# Patient Record
Sex: Female | Born: 1945 | Race: White | Hispanic: No | Smoking: Former smoker
Health system: Southern US, Community
[De-identification: ages and names within clinical notes are randomized; demographics above are authoritative.]

## PROBLEM LIST (undated history)

## (undated) DIAGNOSIS — R0602 Shortness of breath: Secondary | ICD-10-CM

## (undated) DIAGNOSIS — M23206 Derangement of unspecified meniscus due to old tear or injury, right knee: Secondary | ICD-10-CM

## (undated) DIAGNOSIS — K7689 Other specified diseases of liver: Secondary | ICD-10-CM

## (undated) DIAGNOSIS — G8929 Other chronic pain: Secondary | ICD-10-CM

## (undated) DIAGNOSIS — J40 Bronchitis, not specified as acute or chronic: Secondary | ICD-10-CM

## (undated) DIAGNOSIS — M23200 Derangement of unspecified lateral meniscus due to old tear or injury, right knee: Secondary | ICD-10-CM

## (undated) DIAGNOSIS — Z8719 Personal history of other diseases of the digestive system: Secondary | ICD-10-CM

## (undated) DIAGNOSIS — G4700 Insomnia, unspecified: Secondary | ICD-10-CM

## (undated) DIAGNOSIS — K635 Polyp of colon: Secondary | ICD-10-CM

## (undated) DIAGNOSIS — I10 Essential (primary) hypertension: Secondary | ICD-10-CM

## (undated) DIAGNOSIS — D126 Benign neoplasm of colon, unspecified: Secondary | ICD-10-CM

## (undated) DIAGNOSIS — K5730 Diverticulosis of large intestine without perforation or abscess without bleeding: Secondary | ICD-10-CM

## (undated) DIAGNOSIS — E13319 Other specified diabetes mellitus with unspecified diabetic retinopathy without macular edema: Secondary | ICD-10-CM

## (undated) DIAGNOSIS — R1312 Dysphagia, oropharyngeal phase: Secondary | ICD-10-CM

## (undated) DIAGNOSIS — M79602 Pain in left arm: Secondary | ICD-10-CM

## (undated) DIAGNOSIS — N3941 Urge incontinence: Secondary | ICD-10-CM

## (undated) DIAGNOSIS — M23203 Derangement of unspecified medial meniscus due to old tear or injury, right knee: Secondary | ICD-10-CM

## (undated) DIAGNOSIS — IMO0001 Reserved for inherently not codable concepts without codable children: Secondary | ICD-10-CM

## (undated) DIAGNOSIS — Z8739 Personal history of other diseases of the musculoskeletal system and connective tissue: Secondary | ICD-10-CM

## (undated) DIAGNOSIS — N189 Chronic kidney disease, unspecified: Secondary | ICD-10-CM

## (undated) DIAGNOSIS — M1990 Unspecified osteoarthritis, unspecified site: Secondary | ICD-10-CM

## (undated) DIAGNOSIS — Z5189 Encounter for other specified aftercare: Secondary | ICD-10-CM

## (undated) DIAGNOSIS — D3500 Benign neoplasm of unspecified adrenal gland: Secondary | ICD-10-CM

## (undated) DIAGNOSIS — M542 Cervicalgia: Secondary | ICD-10-CM

## (undated) DIAGNOSIS — Z87440 Personal history of urinary (tract) infections: Secondary | ICD-10-CM

## (undated) DIAGNOSIS — C029 Malignant neoplasm of tongue, unspecified: Secondary | ICD-10-CM

## (undated) DIAGNOSIS — K219 Gastro-esophageal reflux disease without esophagitis: Secondary | ICD-10-CM

## (undated) DIAGNOSIS — R29898 Other symptoms and signs involving the musculoskeletal system: Secondary | ICD-10-CM

## (undated) DIAGNOSIS — E785 Hyperlipidemia, unspecified: Secondary | ICD-10-CM

## (undated) DIAGNOSIS — IMO0002 Reserved for concepts with insufficient information to code with codable children: Secondary | ICD-10-CM

## (undated) HISTORY — DX: Other specified diseases of liver: K76.89

## (undated) HISTORY — PX: CATARACT EXTRACTION W/ INTRAOCULAR LENS  IMPLANT, BILATERAL: SHX1307

## (undated) HISTORY — DX: Benign neoplasm of colon, unspecified: D12.6

## (undated) HISTORY — DX: Benign neoplasm of unspecified adrenal gland: D35.00

## (undated) HISTORY — DX: Diverticulosis of large intestine without perforation or abscess without bleeding: K57.30

## (undated) HISTORY — DX: Shortness of breath: R06.02

## (undated) HISTORY — PX: OTHER SURGICAL HISTORY: SHX169

## (undated) HISTORY — DX: Essential (primary) hypertension: I10

## (undated) HISTORY — DX: Personal history of other diseases of the digestive system: Z87.19

## (undated) HISTORY — DX: Reserved for concepts with insufficient information to code with codable children: IMO0002

## (undated) HISTORY — PX: CHOLECYSTECTOMY: SHX55

## (undated) HISTORY — DX: Personal history of other diseases of the musculoskeletal system and connective tissue: Z87.39

## (undated) HISTORY — DX: Gastro-esophageal reflux disease without esophagitis: K21.9

---

## 1982-10-21 HISTORY — PX: ABDOMINAL HYSTERECTOMY: SHX81

## 1988-10-21 HISTORY — PX: SHOULDER SURGERY: SHX246

## 1999-08-09 ENCOUNTER — Ambulatory Visit (HOSPITAL_COMMUNITY): Admission: RE | Admit: 1999-08-09 | Discharge: 1999-08-09 | Payer: Self-pay | Admitting: Family Medicine

## 1999-08-09 ENCOUNTER — Encounter: Payer: Self-pay | Admitting: Family Medicine

## 1999-10-22 HISTORY — PX: LAPAROSCOPIC SALPINGOOPHERECTOMY: SUR795

## 1999-10-31 ENCOUNTER — Encounter: Payer: Self-pay | Admitting: Family Medicine

## 1999-10-31 ENCOUNTER — Ambulatory Visit (HOSPITAL_COMMUNITY): Admission: RE | Admit: 1999-10-31 | Discharge: 1999-10-31 | Payer: Self-pay | Admitting: Family Medicine

## 1999-11-08 ENCOUNTER — Other Ambulatory Visit: Admission: RE | Admit: 1999-11-08 | Discharge: 1999-11-08 | Payer: Self-pay | Admitting: Obstetrics and Gynecology

## 1999-12-19 ENCOUNTER — Encounter (INDEPENDENT_AMBULATORY_CARE_PROVIDER_SITE_OTHER): Payer: Self-pay | Admitting: Specialist

## 1999-12-19 ENCOUNTER — Other Ambulatory Visit: Admission: RE | Admit: 1999-12-19 | Discharge: 1999-12-19 | Payer: Self-pay | Admitting: Gastroenterology

## 2000-01-11 ENCOUNTER — Encounter: Payer: Self-pay | Admitting: Gastroenterology

## 2000-01-11 ENCOUNTER — Ambulatory Visit (HOSPITAL_COMMUNITY): Admission: RE | Admit: 2000-01-11 | Discharge: 2000-01-11 | Payer: Self-pay | Admitting: Gastroenterology

## 2000-01-25 ENCOUNTER — Encounter (INDEPENDENT_AMBULATORY_CARE_PROVIDER_SITE_OTHER): Payer: Self-pay | Admitting: Specialist

## 2000-01-25 ENCOUNTER — Ambulatory Visit (HOSPITAL_COMMUNITY): Admission: RE | Admit: 2000-01-25 | Discharge: 2000-01-25 | Payer: Self-pay | Admitting: Gastroenterology

## 2000-02-11 ENCOUNTER — Ambulatory Visit (HOSPITAL_COMMUNITY): Admission: RE | Admit: 2000-02-11 | Discharge: 2000-02-11 | Payer: Self-pay | Admitting: Family Medicine

## 2000-02-11 ENCOUNTER — Encounter: Payer: Self-pay | Admitting: Family Medicine

## 2000-05-08 ENCOUNTER — Ambulatory Visit (HOSPITAL_COMMUNITY): Admission: RE | Admit: 2000-05-08 | Discharge: 2000-05-08 | Payer: Self-pay | Admitting: Neurosurgery

## 2000-05-08 ENCOUNTER — Encounter: Payer: Self-pay | Admitting: Neurosurgery

## 2000-06-02 ENCOUNTER — Encounter: Payer: Self-pay | Admitting: Obstetrics and Gynecology

## 2000-06-03 ENCOUNTER — Inpatient Hospital Stay (HOSPITAL_COMMUNITY): Admission: EM | Admit: 2000-06-03 | Discharge: 2000-06-05 | Payer: Self-pay | Admitting: Obstetrics and Gynecology

## 2000-06-03 ENCOUNTER — Encounter (INDEPENDENT_AMBULATORY_CARE_PROVIDER_SITE_OTHER): Payer: Self-pay

## 2000-07-02 ENCOUNTER — Encounter: Admission: RE | Admit: 2000-07-02 | Discharge: 2000-07-09 | Payer: Self-pay | Admitting: Orthopaedic Surgery

## 2000-08-05 ENCOUNTER — Encounter: Admission: RE | Admit: 2000-08-05 | Discharge: 2000-11-03 | Payer: Self-pay | Admitting: Orthopaedic Surgery

## 2000-12-19 ENCOUNTER — Encounter: Payer: Self-pay | Admitting: Sports Medicine

## 2000-12-19 ENCOUNTER — Ambulatory Visit (HOSPITAL_COMMUNITY): Admission: RE | Admit: 2000-12-19 | Discharge: 2000-12-19 | Payer: Self-pay | Admitting: Sports Medicine

## 2001-04-21 ENCOUNTER — Ambulatory Visit (HOSPITAL_COMMUNITY): Admission: RE | Admit: 2001-04-21 | Discharge: 2001-04-21 | Payer: Self-pay | Admitting: Family Medicine

## 2001-04-21 ENCOUNTER — Encounter: Payer: Self-pay | Admitting: Family Medicine

## 2001-05-09 ENCOUNTER — Encounter: Admission: RE | Admit: 2001-05-09 | Discharge: 2001-05-09 | Payer: Self-pay | Admitting: Family Medicine

## 2001-05-09 ENCOUNTER — Encounter: Payer: Self-pay | Admitting: Family Medicine

## 2001-08-06 ENCOUNTER — Encounter: Payer: Self-pay | Admitting: Family Medicine

## 2001-08-06 ENCOUNTER — Ambulatory Visit (HOSPITAL_COMMUNITY): Admission: RE | Admit: 2001-08-06 | Discharge: 2001-08-06 | Payer: Self-pay | Admitting: Family Medicine

## 2001-11-17 ENCOUNTER — Encounter: Payer: Self-pay | Admitting: Otolaryngology

## 2001-11-17 ENCOUNTER — Encounter: Admission: RE | Admit: 2001-11-17 | Discharge: 2001-11-17 | Payer: Self-pay | Admitting: Otolaryngology

## 2003-02-07 ENCOUNTER — Encounter: Payer: Self-pay | Admitting: Gastroenterology

## 2003-02-07 ENCOUNTER — Encounter (INDEPENDENT_AMBULATORY_CARE_PROVIDER_SITE_OTHER): Payer: Self-pay | Admitting: Specialist

## 2003-02-07 ENCOUNTER — Ambulatory Visit (HOSPITAL_COMMUNITY): Admission: RE | Admit: 2003-02-07 | Discharge: 2003-02-07 | Payer: Self-pay | Admitting: Gastroenterology

## 2003-02-07 DIAGNOSIS — D126 Benign neoplasm of colon, unspecified: Secondary | ICD-10-CM | POA: Insufficient documentation

## 2004-07-12 ENCOUNTER — Ambulatory Visit (HOSPITAL_COMMUNITY): Admission: RE | Admit: 2004-07-12 | Discharge: 2004-07-12 | Payer: Self-pay | Admitting: Gastroenterology

## 2004-09-06 DIAGNOSIS — K5730 Diverticulosis of large intestine without perforation or abscess without bleeding: Secondary | ICD-10-CM | POA: Insufficient documentation

## 2005-04-03 ENCOUNTER — Ambulatory Visit: Payer: Self-pay | Admitting: Gastroenterology

## 2005-10-21 HISTORY — PX: NECK MASS EXCISION: SHX2079

## 2005-10-21 HISTORY — PX: RADICAL NECK DISSECTION: SHX2284

## 2005-10-21 HISTORY — PX: TONSILLECTOMY: SUR1361

## 2005-10-21 HISTORY — PX: DIRECT LARYNGOSCOPY: SHX5326

## 2006-01-27 ENCOUNTER — Encounter (INDEPENDENT_AMBULATORY_CARE_PROVIDER_SITE_OTHER): Payer: Self-pay | Admitting: *Deleted

## 2006-01-27 ENCOUNTER — Ambulatory Visit (HOSPITAL_COMMUNITY): Admission: RE | Admit: 2006-01-27 | Discharge: 2006-01-28 | Payer: Self-pay | Admitting: Otolaryngology

## 2006-02-11 ENCOUNTER — Encounter: Admission: RE | Admit: 2006-02-11 | Discharge: 2006-02-11 | Payer: Self-pay | Admitting: Otolaryngology

## 2006-02-17 ENCOUNTER — Ambulatory Visit: Admission: RE | Admit: 2006-02-17 | Discharge: 2006-04-24 | Payer: Self-pay | Admitting: Radiation Oncology

## 2006-02-28 ENCOUNTER — Ambulatory Visit (HOSPITAL_COMMUNITY): Admission: RE | Admit: 2006-02-28 | Discharge: 2006-02-28 | Payer: Self-pay | Admitting: Radiation Oncology

## 2006-03-19 ENCOUNTER — Encounter (INDEPENDENT_AMBULATORY_CARE_PROVIDER_SITE_OTHER): Payer: Self-pay | Admitting: *Deleted

## 2006-03-19 ENCOUNTER — Inpatient Hospital Stay (HOSPITAL_COMMUNITY): Admission: RE | Admit: 2006-03-19 | Discharge: 2006-03-23 | Payer: Self-pay | Admitting: Otolaryngology

## 2006-06-04 ENCOUNTER — Encounter: Admission: RE | Admit: 2006-06-04 | Discharge: 2006-07-30 | Payer: Self-pay | Admitting: *Deleted

## 2006-07-25 ENCOUNTER — Ambulatory Visit (HOSPITAL_COMMUNITY): Admission: RE | Admit: 2006-07-25 | Discharge: 2006-07-25 | Payer: Self-pay | Admitting: Otolaryngology

## 2007-02-20 ENCOUNTER — Ambulatory Visit (HOSPITAL_COMMUNITY): Admission: RE | Admit: 2007-02-20 | Discharge: 2007-02-20 | Payer: Self-pay | Admitting: Otolaryngology

## 2007-03-13 ENCOUNTER — Ambulatory Visit: Payer: Self-pay | Admitting: Gastroenterology

## 2007-03-31 ENCOUNTER — Ambulatory Visit (HOSPITAL_COMMUNITY): Admission: RE | Admit: 2007-03-31 | Discharge: 2007-03-31 | Payer: Self-pay | Admitting: *Deleted

## 2007-03-31 DIAGNOSIS — D3500 Benign neoplasm of unspecified adrenal gland: Secondary | ICD-10-CM | POA: Insufficient documentation

## 2007-05-18 ENCOUNTER — Ambulatory Visit: Payer: Self-pay | Admitting: Cardiology

## 2007-05-27 ENCOUNTER — Ambulatory Visit: Payer: Self-pay

## 2007-07-06 ENCOUNTER — Encounter: Admission: RE | Admit: 2007-07-06 | Discharge: 2007-07-17 | Payer: Self-pay | Admitting: *Deleted

## 2007-07-08 ENCOUNTER — Encounter: Admission: RE | Admit: 2007-07-08 | Discharge: 2007-07-08 | Payer: Self-pay | Admitting: Otolaryngology

## 2008-04-05 DIAGNOSIS — C069 Malignant neoplasm of mouth, unspecified: Secondary | ICD-10-CM | POA: Insufficient documentation

## 2008-04-05 DIAGNOSIS — E119 Type 2 diabetes mellitus without complications: Secondary | ICD-10-CM | POA: Insufficient documentation

## 2008-04-05 DIAGNOSIS — K219 Gastro-esophageal reflux disease without esophagitis: Secondary | ICD-10-CM | POA: Insufficient documentation

## 2008-04-05 DIAGNOSIS — K7689 Other specified diseases of liver: Secondary | ICD-10-CM

## 2008-04-05 DIAGNOSIS — K3184 Gastroparesis: Secondary | ICD-10-CM | POA: Insufficient documentation

## 2008-04-05 DIAGNOSIS — F329 Major depressive disorder, single episode, unspecified: Secondary | ICD-10-CM | POA: Insufficient documentation

## 2008-04-05 DIAGNOSIS — I2510 Atherosclerotic heart disease of native coronary artery without angina pectoris: Secondary | ICD-10-CM | POA: Insufficient documentation

## 2008-04-05 DIAGNOSIS — K7581 Nonalcoholic steatohepatitis (NASH): Secondary | ICD-10-CM | POA: Insufficient documentation

## 2008-04-05 DIAGNOSIS — IMO0002 Reserved for concepts with insufficient information to code with codable children: Secondary | ICD-10-CM | POA: Insufficient documentation

## 2008-04-19 ENCOUNTER — Ambulatory Visit (HOSPITAL_COMMUNITY): Admission: RE | Admit: 2008-04-19 | Discharge: 2008-04-19 | Payer: Self-pay | Admitting: Otolaryngology

## 2008-04-27 ENCOUNTER — Encounter: Payer: Self-pay | Admitting: Gastroenterology

## 2008-04-28 ENCOUNTER — Ambulatory Visit: Payer: Self-pay | Admitting: Gastroenterology

## 2008-11-17 ENCOUNTER — Ambulatory Visit (HOSPITAL_COMMUNITY): Admission: RE | Admit: 2008-11-17 | Discharge: 2008-11-17 | Payer: Self-pay | Admitting: Otolaryngology

## 2009-01-05 ENCOUNTER — Ambulatory Visit: Admission: RE | Admit: 2009-01-05 | Discharge: 2009-02-03 | Payer: Self-pay | Admitting: Radiation Oncology

## 2009-01-31 ENCOUNTER — Encounter (INDEPENDENT_AMBULATORY_CARE_PROVIDER_SITE_OTHER): Payer: Self-pay | Admitting: Otolaryngology

## 2009-01-31 ENCOUNTER — Ambulatory Visit (HOSPITAL_BASED_OUTPATIENT_CLINIC_OR_DEPARTMENT_OTHER): Admission: RE | Admit: 2009-01-31 | Discharge: 2009-01-31 | Payer: Self-pay | Admitting: Otolaryngology

## 2009-01-31 HISTORY — PX: TONSILLECTOMY: SUR1361

## 2009-06-29 ENCOUNTER — Encounter: Admission: RE | Admit: 2009-06-29 | Discharge: 2009-06-29 | Payer: Self-pay | Admitting: *Deleted

## 2009-07-03 ENCOUNTER — Ambulatory Visit (HOSPITAL_COMMUNITY): Admission: RE | Admit: 2009-07-03 | Discharge: 2009-07-03 | Payer: Self-pay | Admitting: Otolaryngology

## 2010-02-15 ENCOUNTER — Encounter: Admission: RE | Admit: 2010-02-15 | Discharge: 2010-05-16 | Payer: Self-pay | Admitting: Otolaryngology

## 2010-05-15 ENCOUNTER — Encounter: Admission: RE | Admit: 2010-05-15 | Discharge: 2010-05-15 | Payer: Self-pay | Admitting: Orthopedic Surgery

## 2010-11-10 ENCOUNTER — Encounter: Payer: Self-pay | Admitting: *Deleted

## 2010-11-11 ENCOUNTER — Encounter: Payer: Self-pay | Admitting: Otolaryngology

## 2010-11-22 NOTE — Procedures (Signed)
Summary: Colon   Colonoscopy  Procedure date:  02/07/2003  Findings:      Location:  [facility].    Procedures Next Due Date:    Colonoscopy: 02/2008  Patient Name: Paula Steele, Paula Steele MRN: [id number] Procedure Procedures: Colonoscopy CPT: 95284.    with Hot Biopsy(s)CPT: Z451292.  Personnel: Endoscopist: Barbette Hair. Arlyce Dice, MD.  Indications  Surveillance of: Adenomatous Polyp(s). 2001.  History  Pre-Exam Physical: Performed Feb 07, 2003. Entire physical exam was normal.  Exam Exam: Extent of exam reached: Cecum, extent intended: Cecum.  The cecum was identified by IC valve. Colon retroflexion performed. ASA Classification: II. Tolerance: good.  Monitoring: Pulse and BP monitoring, Oximetry used. at 2 Liters.  Colon Prep Used Golytely for colon prep. Prep results: good.  Sedation Meds: Fentanyl  50 mcg. given IV. Versed 5 mg. given IV.  Findings POLYP: Transverse Colon, Maximum size: 3 mm. Procedure:  hot biopsy, Polyp sent to pathology. ICD9: Colon Polyps: 211.3.  POLYP: Sigmoid Colon, Maximum size: 2 mm. Distance from Anus 30 cm. Procedure:  hot biopsy, sent to pathology. ICD9: Colon Polyps: 211.3.  - POLYP: Sigmoid Colon, Maximum size: 2 mm. Distance from Anus 29 cm. Procedure:  hot biopsy, sent to pathology. ICD9: Colon Polyps: 211.3.   Assessment Abnormal examination, see findings above.  Diagnoses: 211.3: Colon Polyps.   Events  Unplanned Interventions: No intervention was required.  Unplanned Events: There were no complications. Plans Patient Education: Patient given standard instructions for: Polyps.  Scheduling/Referral: Colonoscopy, to Barbette Hair. Arlyce Dice, MD, around Feb 07, 2008.    cc: Rudi Heap, MD  This report was created from the original endoscopy report, which was reviewed and signed by the above listed endoscopist.

## 2010-11-22 NOTE — Procedures (Signed)
Summary: EGD   EGD  Procedure date:  02/07/2003  Findings:      Location: [facility]    Patient Name: Tejasvi, Paula Steele MRN: [id number] Procedure Procedures: Panendoscopy (EGD) CPT: 43235.    with biopsy(s)/brushing(s). CPT: D1846139.  Personnel: Endoscopist: Barbette Hair. Arlyce Dice, MD.  Indications Symptoms: Reflux symptoms  History  Pre-Exam Physical: Performed Feb 07, 2003  Entire physical exam was normal.  Exam Exam Info: Maximum depth of insertion Duodenum, intended Duodenum. Vocal cords visualized. Gastric retroflexion performed. ASA Classification: II. Tolerance: good.  Sedation Meds: Residual sedation present from prior procedure today. Robinul 0.2 given IV. Fentanyl 25 mcg. given IV. Versed 2 mg. given IV. Cetacaine Spray 2 sprays given aerosolized.  Monitoring: BP and pulse monitoring done. Oximetry used. Supplemental O2 given at 2 Liters.  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 Proximal margin 39 cm from mouth,  distal margin 40 cm. Length of Barrett's 1 cm. Inflammation present. Biopsy/Barrett's taken. ICD9: Barrett's: 530.2.   Assessment  Diagnoses: 530.2: Barrett's.   Events  Unplanned Intervention: No unplanned interventions were required.  Unplanned Events: There were no complications. Plans Medication(s): Await pathology. Continue current medications.  Scheduling: Office Visit, to [employer]. Arlyce Dice, MD, around Mar 09, 2003.    CC Rudi Heap, MD  This report was created from the original endoscopy report, which was reviewed and signed by the above listed endoscopist.

## 2010-11-22 NOTE — Assessment & Plan Note (Signed)
Summary: CHRONIC ABD PAIN/YF    History of Present Illness Visit Type: consult Primary GI MD: Melvia Heaps MD [facility] Primary [place]art Marte: Cherly Anderson Requesting Robynne Roat: Cherly Anderson Chief Complaint: recall colonoscopy, c/o generalized dull abd pain and nausea after starting new diabetic med (byetta) in June.  History of Present Illness:   [name]. return for evaluation of nausea and history of colon polyps. Last colonoscopy demonstrated adenomatous polyps 5 years ago. Mrs. Peden has no lower GI complaints. She has a history of gastroparesis which did not respond well to Reglan. She is unsure whether erythromycin was helpful.  She does complain of mild, chronic nausea She also has a history of esophagitis. Question of Barrett's was raised in the past but biopsies did not reveal this. Most recently she suffered from mild lower abdominal pain and generalized pain coincident with her starting byetta.    GI Review of Systems    Reports abdominal pain and  nausea.     Location of  Abdominal pain: generalized.          Prior Medications Reviewed Using: Patient Recall  Updated Prior Medication List: AMARYL 4 MG  TABS (GLIMEPIRIDE) 1 tablet twice a day CRESTOR 5 MG  TABS (ROSUVASTATIN CALCIUM) one tablet every day ZYRTEC ALLERGY 10 MG  TABS (CETIRIZINE HCL) one tablet every day LANTUS 100 UNIT/ML  SOLN (INSULIN GLARGINE) 60 units in morning and 60 units at night BYETTA 5 MCG PEN 5 MCG/0.02ML  SOLN (EXENATIDE) 10 units in am and 10 units at night PROTONIX 40 MG  TBEC (PANTOPRAZOLE SODIUM) one tablet by mouth once daily AMBIEN CR 12.5 MG  TBCR (ZOLPIDEM TARTRATE) one tablet by mouth at bedtime VICODIN HP 10-660 MG  TABS (HYDROCODONE-ACETAMINOPHEN) one tablet by mouth as needed  Current Allergies (reviewed today): No known allergies   Past Medical History:    Reviewed history from 04/05/2008 and no changes required:       Current Problems:        DIVERTICULOSIS OF  COLON (ICD-562.10)       BARRETT'S ESOPHAGUS, HX OF (ICD-V12.79)       COLONIC POLYPS (ICD-211.3)       GERD (ICD-530.81)       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 Shoulder       Rt. salpingo-oophorectomy (2001)       Lysis of endometrial adhesions (2001)       Cholecystectomy       Partial tongue resection (CA)/ Lt. tonsil/ Lt radical neck disection (2007)   Family History:    Thyroid cancer: Brother    Family History of Diabetes: Mother, Brothers    Family History of Heart Disease: Father, Brothers  Social History:    Married    Patient has never smoked.     Alcohol Use - no    Illicit Drug Use - no   Risk Factors:  Tobacco use:  never Drug use:  no Alcohol use:  no   Review of Systems       The patient complains of allergy/sinus, cough, headaches-new, and hearing problems.     Vital Signs:  Patient Profile:   65 Years Old Female Height:     69 inches Weight:      259 pounds BMI:     38.39 Pulse rate:   80 / minute Pulse rhythm:   regular BP sitting:   132 / 74  (right arm)  Vitals Entered By: Christie Nottingham CMA (April 28, 2008 10:40 AM)                    Impression & Recommendations:  Problem # 1:  COLONIC POLYPS (ICD-211.3) Patient will be scheduled for followup colonoscopy  Problem # 2:  GASTROPARESIS (ICD-536.3) This is an ongoing problem  Recommendations #1 trial of erythromycin 250 mg b.i.d.  Problem # 3:  DIVERTICULOSIS OF COLON (ICD-562.10)   Patient Instructions: 1)  CC Dr. Charm Barges 2)  CC Dr.Altheimer      Prescriptions: ERYTHROMYCIN BASE 250 MG  TABS (ERYTHROMYCIN BASE) One tab q.i.d.  #28 x 10   Entered and Authorized by:   Louis Meckel MD   Signed by:   Louis Meckel MD on 04/28/2008   Method used:   Print then Give to Patient   RxID:   [id number]  ]  Appended Document: CHRONIC ABD PAIN/YF pt wanted to call back to sch.colon

## 2010-11-22 NOTE — Miscellaneous (Signed)
Summary: med update  Clinical Lists Changes  Medications: Added new medication of AMARYL 4 MG  TABS (GLIMEPIRIDE) 1 tablet twice a day Added new medication of ERYTHROMYCIN BASE 500 MG  TABS (ERYTHROMYCIN BASE) 1 tablet twice a day Added new medication of CRESTOR 5 MG  TABS (ROSUVASTATIN CALCIUM) one tablet every day Added new medication of ZYRTEC ALLERGY 10 MG  TABS (CETIRIZINE HCL) one tablet every day Added new medication of NEURONTIN 100 MG  CAPS (GABAPENTIN)

## 2011-01-25 LAB — GLUCOSE, CAPILLARY: Glucose-Capillary: 179 mg/dL — ABNORMAL HIGH (ref 70–99)

## 2011-01-30 LAB — POCT I-STAT, CHEM 8
BUN: 16 mg/dL (ref 6–23)
Calcium, Ion: 1.27 mmol/L (ref 1.12–1.32)
Chloride: 106 mEq/L (ref 96–112)
Creatinine, Ser: 0.7 mg/dL (ref 0.4–1.2)
Glucose, Bld: 182 mg/dL — ABNORMAL HIGH (ref 70–99)
HCT: 45 % (ref 36.0–46.0)
Hemoglobin: 15.3 g/dL — ABNORMAL HIGH (ref 12.0–15.0)
Potassium: 4.2 mEq/L (ref 3.5–5.1)
Sodium: 144 mEq/L (ref 135–145)
TCO2: 29 mmol/L (ref 0–100)

## 2011-01-30 LAB — GLUCOSE, CAPILLARY: Glucose-Capillary: 180 mg/dL — ABNORMAL HIGH (ref 70–99)

## 2011-02-04 LAB — GLUCOSE, CAPILLARY: Glucose-Capillary: 250 mg/dL — ABNORMAL HIGH (ref 70–99)

## 2011-03-05 NOTE — Op Note (Signed)
[name], [name]               ACCOUNT NO.:  [id number]   MEDICAL RECORD NO.:  [id number]          PATIENT TYPE:  AMB   LOCATION:  [facility]                          FACILITY:  [facility]   PHYSICIAN:  Christopher E. Ezzard Standing, M.D.DATE OF BIRTH:  1946-07-07   DATE OF PROCEDURE:  01/31/2009  DATE OF DISCHARGE:                               OPERATIVE REPORT   PREOPERATIVE DIAGNOSES:  1. Right tonsil mass.  2. Right forehead, eyebrow lesion consistent with skin cancer.  3. Left nasal tag.   POSTOPERATIVE DIAGNOSES:  1. Right tonsil mass.  2. Right forehead, eyebrow lesion consistent with skin cancer.  3. Left nasal tag.   OPERATION PERFORMED:  1. Right tonsillectomy.  2. Elliptical excision of right forehead lesion with intermediate      closure (1 cm).  3. Simple excision of left nasal skin tag.   SURGEON:  Kristine Garbe. Ezzard Standing, MD   ANESTHESIA:  General endotracheal.   COMPLICATIONS:  None.   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  She had subsequent PET scan, which  has shown some persistent activity in the right tonsil region suspicious  for persistent or recurrent cancer in the right tonsil area, although on  clinical exam, this appears relatively benign.  Because of persistent  activity in the right tonsil area noted by the PET scan, she is taken to  the operating room at this time for right tonsillectomy.  At the same  time, she also has a small superficial ulcer above her right eyebrow,  measures approximately 1 cm that is consistent with a possible skin  cancer.  We will plan on excising this at the same time as her right  tonsillectomy.  She also has another skin tag that interferes with  wearing  glasses on the left side of the nose, which is a simple skin  tag.  We will plan excising this at the same time.   DESCRIPTION OF PROCEDURE:  After adequate endotracheal anesthesia, the  right forehead, right eyebrow lesion was prepped with Betadine solution  and draped out with sterile towels and injected with Xylocaine with  epinephrine for hemostasis.  The lesion just above the right eyebrow was  then elliptically excised and sent to Pathology with a mark on the  superior border.  A small defect was closed with 4-0 Vicryl sutures  subcutaneously and 6-0 nylon reapproximating skin edges.  Bacitracin  ointment and Band-Aid were applied to the right eyebrow lesion.  She  also had a small skin tag on the left side of her nose that was excised  simply with scissors and the base was cauterized for hemostasis.  Bacitracin ointment and a spot Band-Aid was applied to the left side of  the nose.  This completed the excision of the 2 skin lesions.  A  mouth  gag was then used to expose the oropharynx.  The right tonsil was  excised from the tonsillar fossa using a cautery and sent to Pathology.  Remaining oropharynx was fairly clear.  She had some scar tissue on the  left side of her soft palate where she had the wide excision of the left  tonsil a couple of years ago and a scar band was released with cautery  for about 1 cm length on the left side of the soft palate.  This  completed the procedure.  Luana was awoken from anesthesia and  transferred to recovery room, postop doing well.  Tashiba was discharged  home later this morning on amoxicillin suspension 500 mg b.i.d. for 1  week, Tylenol, Lortab elixir 1-1/2 tablespoons q.4 h. p.r.n. pain and  will continue with the regular medications.  We will have her follow up  my office in 1 week for recheck and having the right eyebrow sutures  removed.           ______________________________  Kristine Garbe Ezzard Standing, M.D.     CEN/MEDQ  D:   01/31/2009  T:  02/01/2009  Job:  [id number]   cc:   Billie Lade, M.D.  Samuel Jester

## 2011-03-05 NOTE — Assessment & Plan Note (Signed)
[facility]                            CARDIOLOGY OFFICE NOTE   HUMNA, MOOREHOUSE                      MRN:          [id number]  DATE:05/18/2007                            DOB:          1946/08/03    I was asked by Dr. Samuel Jester to consult on Paula Steele, a 65-year-  old married white female with chest pain.   HISTORY OF PRESENT ILLNESS:  She is 65 years of age.  She is a type 2  poorly-controlled diabetic, has a sedentary lifestyle, and is  significantly overweight.  She has a history of remote smoking, but quit  16 years ago.  She has mixed hyperlipidemia, on Crestor.   She has been having some sharp chest discomfort that comes and goes sort  of unpredictably.  She does have some dyspnea on exertion.   PAST MEDICAL HISTORY:  Significant for no dye allergies.  Past medical  history is also significant for a hysterectomy in 1984, left shoulder  surgery and she had oral cancer a few years ago and neck surgery.   CURRENT MEDICATIONS:  1. Insulin NovoLog.  2. Lantus insulin nightly.  3. Amaryl 4 mg q.a.m. and nightly.  4. Erythromycin 500 mg p.o. b.i.d.  5. Crestor 5 mg a day.  6. Zyrtec unknown dose daily.  7. Protonix 40 mg a day.   She had a chest CT recently for a right lower lobe nodule.  Apparently,  she was then referred for a PET scan; I do not have a copy of the PET  scan.  It turns out that she was told that she had some problems with  her heart; I presume they saw coronary calcification.  We are trying to  obtain those reports at present.   FAMILY HISTORY:  Positive for her sister dying of a heart problems at  age 57; she states this is coronary-related.  She has 2 brothers, one 20  and one 67, who also have coronary issues.   SOCIAL HISTORY:  She is a foster parent.  She retired in 1992.  She has  4 children.  She is married.  She lives in [place].   REVIEW OF SYSTEMS:  Her review of systems, other than the HPI, is  positive for fatigue, intermittent allergies, hiatal hernia,  gastroesophageal reflux and depression.   Rest of the review of systems are negative.   PHYSICAL EXAMINATION:  Her blood pressure is 140/81.  Her pulse is 79  and regular.  She is 5 feet 9 inches and weighs 264 pounds.  She has a depressed affect.  She is alert and oriented.  NEUROLOGICAL:  Exam is grossly intact.  HEENT:  Normocephalic, atraumatic.  PERRLA.  Extraocular movements  intact.  Sclerae are clear.  Facial symmetry is normal.  Carotids are full.  She has a scar on the left anterior cervical chain  area.  There is no thyromegaly.  LUNGS:  Clear.  HEART:  Poorly appreciated PMI.  She has a normal S1 and S2 without  murmur, rub or gallop.  ABDOMEN:  Soft with good bowel sounds.  Organomegaly could not be  assessed because of her obesity.  EXTREMITIES:  No cyanosis or clubbing, but does reveal trace pretibial  edema.  Pulses are 2+/4+ and bilaterally symmetrical.  NEUROLOGIC:  Exam  is intact.  SKIN:  Unremarkable.   EKG:  Normal.   ASSESSMENT AND PLAN:  Paula Steele is having noncardiac chest discomfort;  however, she has coronary disease with her history of diabetes,  particularly being poorly controlled.  She also has other risk factors  including family history, sedentary lifestyle and mixed hyperlipidemia.  I put her in the moderate to high-risk Framingham global score.   I have recommended a stress Myoview.  She does not want adenosine, based  on what her sister told her.  We will set her up for a dobutamine,  assuming she cannot exercise to an adequate heart rate and good exercise  level.  I will see her back on a p.r.n. basis if her scan is normal or  low risk.  If there is a suggestion of significant obstructive disease,  we will recommend heart catheterization.   I have pulled her PET scan, which showed atherosclerotic-type changes  including prominent coronary artery calcifications.     Thomas C.  Daleen Squibb, MD, [facility]  Electronically Signed    TCW/MedQ  DD: 05/18/2007  DT: 05/19/2007  Job #: [id number]   cc:   Samuel Jester

## 2011-03-05 NOTE — Assessment & Plan Note (Signed)
[facility]                         GASTROENTEROLOGY OFFICE NOTE   Paula Steele, Paula Steele                      MRN:          [id number]  DATE:03/13/2007                            DOB:          [birth year]    PROBLEM LIST:  1. Nausea.  2. Rectal bleeding.   Paula Steele has returned for reevaluation.  Since her last visit, she  underwent a partial tongue resection with lymph node dissection for a  squamous carcinoma.  She has been taking pain medicines intermittently.  Over the last month, she is complaining of frequent nausea.  She has a  history of gastroparesis that was poorly responsive to Reglan.  She did  get some improvement with erythromycin.  She has also had a few episodes  of limited bright red blood per rectum.  She has a history of  adenomatous colon polyps.   MEDICATIONS:  1. Insulin.  2. Amaryl.  3. Protonix.  4. Crestor.  5. Hyoscyamine.   PHYSICAL EXAMINATION:  Pulse 88, blood pressure 126/72, weight 255.  HEENT: EOMI. PERRLA. Sclerae are anicteric.  Conjunctivae are pink.  NECK:  Supple without thyromegaly, adenopathy or carotid bruits.  CHEST:  Clear to auscultation and percussion without adventitious  sounds.  CARDIAC:  Regular rhythm; normal S1 S2.  There are no murmurs, gallops  or rubs.  ABDOMEN:  Bowel sounds are normoactive.  Abdomen is soft, non-tender and  non-distended.  There are no abdominal masses, tenderness, splenic  enlargement or hepatomegaly.  EXTREMITIES:  Full range of motion.  No cyanosis, clubbing or edema.  RECTAL:  Deferred.   IMPRESSION:  1. Nausea.  This could be due to her gastroparesis.  Her narcotic use      with Lorcet may also be contributing.  2. Limited rectal bleeding.  Most likely secondary to hemorrhoids.  A      more proximal bleeding source ought to be ruled out.  3. History of colon polyps.   RECOMMENDATION:  1. Trial of erythromycin 500 mg twice a day.  If she is not improved,    give her Phenergan and      advise her holding on the Lorcet.  2. Colonoscopy.     Barbette Hair. Arlyce Dice, MD,FACG  Electronically Signed    RDK/MedQ  DD: 03/13/2007  DT: 03/13/2007  Job #: [id number]   cc:   Ernestina Penna, M.D.

## 2011-03-08 NOTE — Procedures (Signed)
[facility]  Patient:    Paula Steele, Paula Steele                         MRN: [id number] Proc. Date: 01/25/00 Attending:  Barbette Hair. Arlyce Dice, M.D. [facility]                           Procedure Report  INDICATIONS:  Mrs. Romelle Starcher has abnormal liver tests that was performed for further evaluation.  INFORMED CONSENT:  The patient provided consent after risks, benefits and alternatives were explained.  MEDICATIONS:  Versed 3 mg and fentanyl 25 mcg IV.  DESCRIPTION OF PROCEDURE:  The patient was placed in the supine position.  The liver was percussed.  The skin was prepped with Betadine and injected with 1% lidocaine.  A percutaneous biopsy was performed utilizing a 14-gauge tissue biopsy needle.  A 1.5 cm liver core was obtained.  The patient tolerated the procedure well. DD:  05/29/00 TD:  05/30/00 Job: [id number]

## 2011-03-08 NOTE — H&P (Signed)
[facility]  Patient:    Paula Steele, Paula Steele                        MRN: [id number] Adm. Date:  06/03/00 Attending:  Guy Sandifer. Arleta Creek, M.D.                         History and Physical  CHIEF COMPLAINT:  Right ovarian cyst.  HISTORY OF PRESENT ILLNESS:  This patient is a 65 year old, married, white female, G3, P3, status post total abdominal hysterectomy in 1984 for repeated pelvic infections.  The patient reports a left salpingo-oophorectomy at that time a swell.  She complains of intermittent abdominal pelvic pain for the past two years.  This has been more frequent over the past several months. MRI in January of this year revealed a 6.5 cm complex cystic lesion of the right adnexa.  Several thin internal septations were noted.  There was no nodularity, no fatty tissue suggestive of a dermoid.  No pelvic or abdominal adenopathy or ascites or organomegaly was noted.  There was an incidental 2.5 cm left adrenal cyst evaluated by Dr. Annabell Howells who felt it was benign. Followup abdominal pelvic CT on February 11, 2000, revealed a slight prominence at the left lobe of the liver consistent with the patients history of cirrhosis.  The adrenal cyst was unchanged.  The complex septated cystic mass of the right ovary was again noted measuring 7 x 6 cm.  Again, no organomegaly, ascites, or adenopathy was noted.  The patient continues to complain of vague abdominal pains on occasion.  Ultrasound on May 29, 2000, was again consistent with a 7.3 x 5.3 x 7.5 cm right ovarian cyst with several thin septations.  Left ovary was not identified, and the uterus is removed as well.  There is no free fluid.  CA125 on May 26, 2000, is 19.7.  After careful discussion of the options, the patient is scheduled for laparoscopy with probably right salpingo-oophorectomy, possible laparotomy, possible lymph node dissection. Dr. Stanford Breed is on standby if his assistance is  needed.  PAST MEDICAL HISTORY: 1. Insulin-dependent diabetes. 2. Cirrhosis of the liver. 3. Esophageal narrowing. 4. Superficial varicosities of the lower extremities. 5. History of a bulging disk and back pain.  PAST SURGICAL HISTORY:  Total abdominal hysterectomy with left salpingo-oophorectomy in 1984.  MEDICATIONS: 1. Humulin insulin 25 units b.i.d. 2. Amaryl.  ALLERGIES:  No known drug allergies.  FAMILY HISTORY:  Positive for diabetes in mother, brothers, and sisters. Chronic hypertension in mother.  OBSTETRICAL HISTORY: Vaginal delivery x 3.  SOCIAL HISTORY:  The patient denies tobacco, alcohol, or drug abuse.  REVIEW OF SYSTEMS:  Negative except as above.  PHYSICAL EXAMINATION:  VITAL SIGNS:  Height 5 feet 9 inches, weight 222 pounds.  Blood pressure 122/80.  NECK:  Without thyromegaly.  No supraclavicular or lymphadenopathy.  LUNGS:  Clear to auscultation.  HEART:  Regular rate and rhythm.  BACK:  Without CVA tenderness.  BREASTS:  Without masses, retraction, or discharge.  ABDOMEN:  Obese, soft, with mild diffuse tenderness with no rebound.  No masses, no fluid wave is noted.  LYMPHADENOPATHY:  No inguinal lymphadenopathy bilaterally.  PELVIC:  Vulva and vagina without lesion.  There is mild tenderness bilaterally without masses.  Exam is highly compromised by patients habitus.  RECTAL:  Negative for masses.  EXTREMITIES:  Grossly within normal limits.  NEUROLOGIC:  Grossly within normal limits.  ASSESSMENT:  Right ovarian cyst.  PLAN:  Laparoscopy, probable right salpingo-oophorectomy, possible laparotomy, possible lymph node dissection. DD:  05/29/00 TD:  05/29/00 Job: [id number]

## 2011-03-08 NOTE — Discharge Summary (Signed)
[facility]  Patient:    Paula Steele, Paula Steele                      MRN: [id number] Adm. Date:  [id number] Disc. Date: [id number] Attending:  Cordelia Pen Ii                           Discharge Summary  ADMITTING DIAGNOSIS:  Right ovarian cyst.  DISCHARGE DIAGNOSIS:  Right ovarian cyst.  PROCEDURE:  On June 03, 2000, exploratory laparotomy with right salpingo-oophorectomy and lysis of adhesiones and laparoscopy.  REASON FOR ADMISSION:  This patient is a 65 year old married white female, g3, p3, status post TAH/LSO.  She has a right ovarian cyst.  Details are dictated in the history and physical.  Laparoscopy, probable RSO, possible laparotomy, possible node dissection and staging procedures are discussed with the patient.  HOSPITAL COURSE:  The patient is admitted to the hospital and undergoes the above procedure.  She initially had laparoscopy and secondary to dense adhesions, it was converted to laparotomy with RSO and exploratory laparotomy. 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  Estimated blood loss for the case was 200 cc.  Leaving the surgery, she was afebrile, good urine output, with good pain control.  On the first postoperative day she has good pain relief, is hungry, tolerating a soft diet but not yet passing flatus.  Hemoglobin is 12.2.  White count 8.5.  The patient is a diabetic and is maintained on a sliding scale insulin with blood sugars ranging in the 200-235 range.  On the second postoperative day, the patient is tolerating a regular diet and passing flatus.  She is returned to her preoperative dose of insulin and Amaryl.  Her incisions are healing well. She is ambulating well.  CONDITION ON DISCHARGE:  Good.  DIET:  Her prescribed diabetic diet.  DISCHARGE MEDICATIONS: 1. Insulin Humulin 70/30 25 units b.i.d. subcutaneous. 2. Amaryl 4 mg p.o. b.i.d. 3. Tylox #30 1-2 p.o.  q.4h. p.r.n.  ACTIVITY:  The patient is to refrain from driving, operation of automobiles, lifting, or vaginal entry.  DISCHARGE INSTRUCTIONS:  She is to call the office for problems including but not limited to a temperature of 100 degrees, increasing pain, or persistent nausea and vomiting.  She is to closely follow her blood sugars.  She is to call us or her diabetic doctor for problems with blood sugars and insulin dosing.  FOLLOW-UP:  Follow-up is in the office in two weeks. DD:  06/05/00 TD:  06/06/00 Job: [id number]

## 2011-03-08 NOTE — Op Note (Signed)
NAMEPORCHEA, CHARRIER               ACCOUNT NO.:  [id number]   MEDICAL RECORD NO.:  [id number]          PATIENT TYPE:  INP   LOCATION:  2550                         FACILITY:  [facility]   PHYSICIAN:  Kristine Garbe. 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   OPERATION:  Excision of left tonsil, excision of the left base of tongue  mass with left radical neck dissection.   SURGEON:  Kristine Garbe. Ezzard Standing, M.D.   ASSISTANT:  Hermelinda Medicus, M.D.   ANESTHESIA:  General endotracheal.   COMPLICATIONS:  None.   BRIEF CLINICAL NOTE:  Paula Steele is a 65 year old female who recently  developed a large left neck mass which was excised in April.  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  Primary was not obvious but on  PET scan, an area in the left tonsil and left base of the tongue was  suspicious for primary site of squamous cell carcinoma malignancy.  Patient  was recommended radiation/oncology treatment but declined radiation therapy.  I discussed extensively with her concerning recommendation of radiation, but  the patient would prefer not to have radiation and elected surgery.  I  discussed with her that if the surgery had a bad prognosis, she would still  recommend radiation therapy postoperatively.  She accepts this.  She is  taken to the operating room at this time for wide excision of left tonsil,  left base of the tongue, and left radical neck dissection.   DESCRIPTION OF PROCEDURE:  After adequate endotracheal anesthesia, patient  received 1 gm of Ancef IV preoperatively as well as 900 mg of Cleocin  preoperatively IV.  First, the mouth gag was used to expose the oropharynx.  The left tonsil appeared relatively benign and was soft to palpation;  however, on palpation, there was a firm nodule in the lingual tonsil area,  base of the tongue area, at the inferior aspect of the left tonsil.  The  tonsil was initially excised, and then the base of the tongue with some  surrounding normal-appearing tongue tissue was excised with cautery and sent  to pathology for frozen section and to check margins.  While waiting for  pathology, the left neck was prepped, marked out and draped out for left  radical neck dissection.  Final pathology report on the tonsil and base of  the tongue revealed no cancer within the tonsil; however, there was an area  of cancer within the base of the tongue with negative margins.  The left  radical neck dissection was performed with Dr. Haroldine Laws assisting.  The  patient has had previous large upper jugular node excised, and there was  some scar tissue in this area.  Subplatysmal flaps were elevated superiorly  and inferiorly.  Dissection was carried out, first in the posterior portion  of the neck to identify the spinal accessory nerve, which was identified and  preserved throughout the dissection.  The submandibular gland was excised,  and there were no obvious palpable submandibular nodes identified.  Dissection was then carried out posteriorly along the posterior belly of the  digastric muscle.  The jugular vein and spinal accessory nerve were  identified just superiorly just beneath the posterior belly digastric  muscle.  There was also some scar tissue up in this area from previous  excision of the high jugular nodes from previous lymph node removal over a  month ago.  This made the dissection a little bit more tedious.  The jugular  vein was then isolated.  It was ligated with 2-0 silk sutures and silk  suture ligature and divided.  The spinal accessory nerve was  preserved  posteriorly in the posterior aspect of the neck.  Next, the  sternocleidomastoid muscle was dissection off of the clavicle.  The omohyoid  muscle was identified and transected, and the inferior aspect of the jugular  vein was identified in the inferior portion of the neck.  This likewise was  ligated with 2-0 silk suture and suture ligature and divided.  The vagus  nerve and carotid artery were identified and preserved throughout the  dissection.  The supraclavicular fat was dissected out of the  supraclavicular area.  Cranial nerves, XII, XI, and X were identified in the  superior aspect of the neck and preserved throughout the dissection.  Hemostasis was obtained with 3-0 and 2-0 silk ligatures and cautery.  The  neck specimen was dissected off of the carotid artery and vagus nerve, off  of the deep cervical fascia.  Specimen was sent to pathology.   Of note, during the excision of the submandibular gland, a small opening to  the base of the tongue area where the base of the tongue excision was  identified, and this was just left open.  As there was no real significant  tissue that could close this.  After excising the neck specimen, the wound  was irrigated with saline.  Hemostasis was obtained, and the Hemovac drains  were brought out through a separate stab incision inferiorly.  The neck was  closed with 3-0 chromic sutures subcutaneously, and staples on the skin.  The patient was subsequently awakened from anesthesia and transferred to the  recovery room postop, doing well.   DISPOSITION:  Patient will be observed over in the intensive care unit  initially.  She has a history of insulin-dependent diabetes, which will need  to be monitored closely, along with pain control.  Will initially keep  patient n.p.o. except for sips and chips and slowly advance diet over the  next 2 or 3 days.           ______________________________ Kristine Garbe Ezzard Standing, M.D.      CEN/MEDQ  D:  03/19/2006  T:  03/19/2006  Job:  [id number]   cc:   Hermelinda Medicus, M.D.  Fax: [phone]   Samuel Jester  Fax: [phone]   Billie Lade, M.D.  Fax: [phone]

## 2011-03-08 NOTE — Op Note (Signed)
NAMEADAYAH, AROCHO               ACCOUNT NO.:  [id number]   MEDICAL RECORD NO.:  [id number]          PATIENT TYPE:  OIB   LOCATION:  5703                         FACILITY:  [facility]   PHYSICIAN:  Kristine Garbe. 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   OPERATION/PROCEDURE:  1.  Excision of left neck cyst mass.  2.  Direct laryngoscopy.   SURGEON:  Kristine Garbe. Ezzard Standing, M.D.   ASSISTANT:  Hermelinda Medicus, M.D.   ANESTHESIA:  General endotracheal anesthesia.   COMPLICATIONS:  None.   BRIEF CLINICAL NOTE:  Orine Goga is a 65 year old female who has had a  left neck mass initially evaluated in 2003 with a CT scan which demonstrated  a cyst consistent with probable branchial cleft cyst.  She elected not to  have this removed at that time and is _____ .  This gradually got a little  bit larger and caused more discomfort and recent MRI scan of the neck again  shows a large left neck cyst.  She was taken to the operating room at this  time for excision of left neck mass.   DESCRIPTION OF PROCEDURE:  After general endotracheal anesthesia, the  patient received 1 g Ancef IV preoperatively.  The patient's left neck was  prepped with Betadine solution and draped out with sterile towels.  A  horizontal incision was made at the mid inferior portion of the neck mass.  Incision was carried down through the subcutaneous tissue.  The mass was  deep to the sternocleidomastoid muscle.  The mass was very large.  There was  another smaller cyst just inferior to the primary cyst. This was excised and  sent for frozen section.  On frozen section, this was consistent with a cyst  but questionable cancer or  malignancy involved in the cyst.  The larger cyst  was very adherent and more solid superiorly.  The spinal accessory node,  carotid artery and jugular vein were identified on dissection.  The mass was  dissected out and very superiorly, there was more solid tissue consistent  with carcinoma.  This was all grossly removed.  There were a few smaller  nodes inferiorly, approximately 1-1.5 cm nodes, were free and mobile.  After  taking adequate hemostasis, the wound was copiously irrigated with saline.  A quarter-inch Penrose drain was brought out through the excision site.  The  wound was then closed with 3-0 chromic suture subcutaneously and 5-0 nylon  reapproximated the skin edges.  Dressing was applied.   After dressing the wound, because of frozen section revealing probable  carcinoma, direct laryngoscopy was performed.  On direct laryngoscopy, the  patient's tongue was soft to palpation.  Visualization of the tonsils were  benign bilaterally.  There was mucosal cover with no ulceration.  No  significant induration although the left tonsil  was a little bit larger than  the right.  The follicular epiglottis was clear.  The base of the tongue was  clear to visualization.  Piriform sinuses, AE folds, true and false cords  were all clear.  No biopsies were obtained as everything appeared clear.  This completes the procedure.  Collin was awakened from anesthesia and  transferred to the recovery room postoperatively doing well.   DISPOSITION:  Kristiann will be observed overnight in the hospital and plan to  discharge in the morning after removing the Penrose drain.  The remaining  tissue was sent for permanent section.           ______________________________  Kristine Garbe Ezzard Standing, M.D.     CEN/MEDQ  D:  01/27/2006  T:  01/28/2006  Job:  [id number]   cc:   Aniceto Boss, MD  [facility]

## 2011-03-08 NOTE — Op Note (Signed)
[facility]  Patient:    Paula Steele, Paula Steele                      MRN: [id number] Proc. Date: 06/03/00 Adm. Date:  [id number] Attending:  Cordelia Pen Ii                           Operative Report  PREOPERATIVE DIAGNOSIS:  Right ovarian cyst.  POSTOPERATIVE DIAGNOSIS:  Right ovarian cyst.  PROCEDURE:  Exploratory laparotomy with right salpingo-oophorectomy, lysis of adhesions and laparoscopy.  SURGEON:  Harold Hedge, M.D.  ASSISTANT:  Artist Pais, M.D.  ANESTHESIA:  General with endotracheal intubation, Richardean Chimera, M.D.  ESTIMATED BLOOD LOSS:  200 cc.  PATHOLOGY:  Frozen section is benign.  INDICATIONS AND CONSENT:  The patient is a 65 year old married white female, G3, P3 status post total abdominal hysterectomy with LSO in 1984. She has developed a right ovarian cyst. Details are dictated on the history and physical. Laparoscopy, possible laparotomy with right salpingo-oophorectomy is discussed. Possible lymph node dissection and staging procedures are discussed in the event of malignancy. Dr. Stanford Breed is on standby for the procedure. Possible risks and complications have been discussed with the patient including but not limited to infection, bowel, bladder or ureteral damage, bleeding requiring transfusion of blood products with possible transfusion reaction HIV and hepatitis acquisition, DVT, PE, pneumonia, lymphocele formation and wound breakdown. All questions are answered and consent is signed on the chart.  FINDINGS:  Laparoscopically, there are extensive omental adhesions to the anterior abdominal wall to the right of the midline. The omentum also completely covers the pelvis and the right adnexa. Upon laparotomy, the right ovary is seen to be approximately 7 cm and is smooth, translucent.  DESCRIPTION OF PROCEDURE:  The patient is taken to the operating room and placed in the dorsal supine position where general  anesthesia is induced via endotracheal intubation. She is then placed in the dorsal lithotomy position where she is prepped abdominally and vaginally. The bladder is straight catheterized. She is draped in a sterile fashion. A sponge on a stick is placed in the vagina. An infraumbilical incision is made and a 12 mm disposable trocar sleeve is placed on the first attempt without difficulty. Placement is verified with the laparoscope and no damage to surrounding structures is noted. The above findings are noted. A suprapubic and later a right lower quadrant incision are made and 5 mm nondisposable trocar sleeves are placed under direct vision without difficulty. After a brief attempt at lysis of adhesions its felt that the adhesions are much too dense and extensive to safely pursue laparoscopically. Therefore the decision is made to proceed with the laparotomy. Prior to this, pelvic washings were taken and sent for cytology. The inferior trocar sleeves were removed and the peritoneum was reduced and the infraumbilical trocar sleeve was removed. A #0 Vicryl suture in the deep underlying layer with care being taken not to pick up any underlying structures was placed. The umbilical incision was closed with 3-0 Vicryl sutures in a mattress fashion. A right lower quadrant incision was closed with subcuticular 3-0 Vicryl suture. A Foley catheter was then placed in the bladder. After proper positioning, a Pfannenstiel incision was made and dissection was carried out in layers to the peritoneum. The peritoneum was bluntly entered and extended superiorly and inferiorly. Extensive sharp and some blunt dissection is used to take down the omental adhesions adequately to  expose the right ovary. The right ovary is only minimally adherent at its base to the pelvic side wall. After taking care to avoid the course of the right ureter by properly elevating the ovary as well as inspection of the pelvic side  wall, a Heaney clamp was placed across the infundibulopelvic ligament. Kelly clamps were placed to control back bleeding and this pedicle was taken down and doubly ligated with 2-0 monocryl sutures. The adhesions of the anterior cyst to the pelvic side wall are taken down the retroperitoneal space is developed. This developed sharply and bluntly without difficulty. The cyst is thereby elevated without difficulty and sent intact to pathology for frozen section analysis. Careful inspection reveals a single small pumper at the point of dissection of the ovary. This is picked up with a right angled clamp and tied off with a #0 monocryl free tie. Copious irrigation is carried out. Several small bleeders at the peritoneal edge of the bladder as well as on the omentum are controlled with cautery. Careful inspection of the subfascial space is also carried out and good hemostasis is noted. The anterior rectus fascia is closed in running fashion with #0 PDS suture starting at each angle and meeting in the middle. A single #0 monocryl suture is also used in the center and the fascial closure as well to assure complete closure of the fascia. The skin is closed with clips. 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 No carcinoma, no malignancy. The incisions are injected with 0.5% plain Marcaine. Dressings are applied. All counts correct. The patient is awakened and taken to the recovery room in stable condition. DD:  06/03/00 TD:  06/03/00 Job: [id number]

## 2011-03-08 NOTE — Discharge Summary (Signed)
NAMEJENTRI, AYE               ACCOUNT NO.:  [id number]   MEDICAL RECORD NO.:  [id number]          PATIENT TYPE:  INP   LOCATION:  5740                         FACILITY:  [facility]   PHYSICIAN:  Kristine Garbe. Ezzard Standing, M.D.DATE OF BIRTH:  07/19/1946   DATE OF ADMISSION:  03/19/2006  DATE OF DISCHARGE:  03/23/2006                                 DISCHARGE SUMMARY   PATIENT'S ADMITTING DIAGNOSES:  1. 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  2. Insulin-dependent diabetes.  3. Obesity.   OPERATIONS DURING THIS HOSPITALIZATION:  Wide excision of left tonsil left  base of tongue squamous cell carcinoma.  Left radical neck dissection.   DATE OF OPERATION:  Mar 19, 2006.   CONSULTS:  Nutrition for NG tube feedings.   BRIEF HISTORY:  Paula Steele is a 65 year old female who has had a  longstanding left neck mass that has gradually gotten larger.  She had this  excised back in April and on final pathology this demonstrated metastatic  squamous cell carcinoma to 2 lymph nodes at time of excision.  Subsequent  workup of the metastatic carcinoma to the left neck nodes revealed a  positive PET scan of the left tonsil, left base of tongue region, which is  consistent with primary site of squamous cell carcinoma.  She was seen by  radiation oncology to treat this with radiation therapy, but she declined to  have radiation to treat the base of tongue tonsil cancer with mets to the  left neck.  She was agreeable to have surgery to have this widely excised  with left radical neck dissection.  She is admitted at this time for wide  resection of primary lesion at the left base of tongue, left tonsil region  with a left radical neck dissection.  She has a history of insulin dependent  diabetes.   PATIENT'S HOSPITAL COURSE:  The patient was admitted via the operating room,  at which time she underwent a wide excision of left tonsil, left lateral  base of tongue squamous cell carcinoma,  along with left radical neck  dissection.  Postoperatively, she was admitted to the intensive care unit.  She received perioperative antibiotics, Ancef and Cleocin.  She was started  on Panda tube feedings on her first postoperative day.  She remained  afebrile.  Vital signs stable.  She was subsequently transferred to the  floor on her second postoperative day.  She had no airway problems.  Wounds  were healing nicely.  She began sips of liquids on her third postoperative  day.  Her Hemovac drain was removed on her third postoperative day.  Wounds  were doing well and the patient was subsequently discharged home on  amoxicillin suspension 5 mg b.i.d. and Lortab elixir p.r.n. pain.  She will  continue with tube feedings and have a followup appointment in 2 to 3 days  for recheck and have the Panda tube removed.  At time of discharge, patient  was doing well and taking sips of liquids, along with the Panda tube  feedings.  Neck was doing well.  She  was having no respiratory difficulty.  Of note, followup pathology report revealed a 1 cm invasive squamous cell  carcinoma of the left posterior tongue with depth invasion of 1 cm, margins  were all negative.  Left neck dissection revealed 23 lymph nodes, all  negative for tumor.  Her left tonsil had no evidence of malignancy.   DISPOSITION:  The patient is subsequently discharged home Lortab elixir and  amoxicillin, along with her insulin.  She will followup in my office in 2  days for recheck.           ______________________________  Kristine Garbe. Ezzard Standing, M.D.     CEN/MEDQ  D:  06/03/2006  T:  06/03/2006  Job:  [id number]

## 2011-03-19 ENCOUNTER — Encounter: Payer: Self-pay | Admitting: Gastroenterology

## 2011-04-02 ENCOUNTER — Encounter: Payer: Self-pay | Admitting: *Deleted

## 2011-04-16 ENCOUNTER — Other Ambulatory Visit: Payer: Self-pay | Admitting: Gastroenterology

## 2011-05-14 ENCOUNTER — Ambulatory Visit (HOSPITAL_BASED_OUTPATIENT_CLINIC_OR_DEPARTMENT_OTHER)
Admission: RE | Admit: 2011-05-14 | Discharge: 2011-05-14 | Disposition: A | Discharge status: Home / Self Care | Payer: Medicare Other | Source: Ambulatory Visit | Attending: Otolaryngology | Admitting: Otolaryngology

## 2011-05-14 ENCOUNTER — Other Ambulatory Visit: Payer: Self-pay | Admitting: Otolaryngology

## 2011-05-14 DIAGNOSIS — L723 Sebaceous cyst: Secondary | ICD-10-CM | POA: Insufficient documentation

## 2011-05-15 NOTE — Op Note (Signed)
  NAMEKATANYA, SCHLIE               ACCOUNT NO.:  [id number]  MEDICAL RECORD NO.:  [id number]  LOCATION:                                 FACILITY:  PHYSICIAN:  Kristine Garbe. Ezzard Standing, M.D. DATE OF BIRTH:  DATE OF PROCEDURE:  05/14/2011 DATE OF DISCHARGE:                              OPERATIVE REPORT   PREOPERATIVE DIAGNOSIS:  Left neck cyst consistent with sebaceous cyst.  POSTOPERATIVE DIAGNOSIS:  Left neck cyst consistent with sebaceous cyst.  OPERATIONS:  Excision of left neck cyst with intermediate closure, 1-2 cm size.  SURGEON:  Kristine Garbe. Ezzard Standing, MD  ANESTHESIA:  Local 1% Xylocaine with 100,000 epinephrine.  COMPLICATIONS:  None.  BRIEF CLINICAL NOTE:  Kinslei Labine is a 65 year old female who has had previous history of left tonsil tongue cancer with metastasis to the left neck, status post neck dissection several years ago.  She has developed a lump in the lower portion of the left neck measuring approximately 1-1/2 cm in size consistent with probable sebaceous cyst. Because of her concern, she is taken to operating room at this time for excision of local anesthetic.  DESCRIPTION OF PROCEDURE:  The left neck was prepped with Betadine solution and draped out with sterile towels.  The cyst area was then injected with 3-4 mL of local Xylocaine with epinephrine.  The overlying skin was elliptically excised including the cyst opening and dissection was carried down to include the entire cyst within the subcutaneous fat. Hemostasis was obtained with electrocautery.  A defect was closed with 4- 0 chromic sutures subcutaneously and Dermabond to reapproximate skin edges.  Specimen was sent to pathology.  Kimberle tolerated this well and is discharged home on Tylenol and Motrin p.r.n. pain.  I gave her a prescription for Keflex if she develops any swelling, erythema or signs of infection.  However, follow up in my office in 1 week for recheck.     ______________________________ Kristine Garbe. Ezzard Standing, M.D.     CEN/MEDQ  D:  05/14/2011  T:  05/14/2011  Job:  [id number]  Electronically Signed by Dillard Cannon M.D. on 05/15/2011 11:41:10 AM

## 2011-05-20 ENCOUNTER — Encounter: Payer: Self-pay | Admitting: Gastroenterology

## 2011-05-20 NOTE — Telephone Encounter (Signed)
Error

## 2011-06-20 ENCOUNTER — Other Ambulatory Visit: Payer: Self-pay | Admitting: *Deleted

## 2011-06-20 DIAGNOSIS — D3500 Benign neoplasm of unspecified adrenal gland: Secondary | ICD-10-CM

## 2011-06-25 ENCOUNTER — Inpatient Hospital Stay: Admission: RE | Admit: 2011-06-25 | Payer: Medicare Other | Source: Ambulatory Visit

## 2011-06-25 ENCOUNTER — Ambulatory Visit
Admission: RE | Admit: 2011-06-25 | Discharge: 2011-06-25 | Disposition: A | Discharge status: Home / Self Care | Payer: Medicare Other | Source: Ambulatory Visit | Attending: *Deleted | Admitting: *Deleted

## 2011-06-25 DIAGNOSIS — D3500 Benign neoplasm of unspecified adrenal gland: Secondary | ICD-10-CM

## 2011-07-02 ENCOUNTER — Telehealth: Payer: Self-pay | Admitting: Gastroenterology

## 2011-07-02 NOTE — Telephone Encounter (Signed)
Pt is complaining of abdominal pain. States her primary care doctor did a "stomach scan" and told her she needed to see Dr. Arlyce Dice. Pt had CT scan done 06/20/11. Scheduled pt to see Mike Gip PA 07/04/11 @1 :30pm, Dr. Arlyce Dice is the supervising physician that afternoon. Pt aware of appt date and time.

## 2011-07-04 ENCOUNTER — Encounter: Payer: Self-pay | Admitting: Physician Assistant

## 2011-07-04 ENCOUNTER — Ambulatory Visit (INDEPENDENT_AMBULATORY_CARE_PROVIDER_SITE_OTHER): Payer: Medicare Other | Admitting: Physician Assistant

## 2011-07-04 VITALS — BP 114/82 | HR 76 | Ht 68.0 in | Wt 268.8 lb

## 2011-07-04 DIAGNOSIS — R1013 Epigastric pain: Secondary | ICD-10-CM

## 2011-07-04 DIAGNOSIS — Z860100 Personal history of colon polyps, unspecified: Secondary | ICD-10-CM

## 2011-07-04 DIAGNOSIS — R1032 Left lower quadrant pain: Secondary | ICD-10-CM

## 2011-07-04 DIAGNOSIS — K7460 Unspecified cirrhosis of liver: Secondary | ICD-10-CM

## 2011-07-04 MED ORDER — PEG-KCL-NACL-NASULF-NA ASC-C 100 G PO SOLR: ORAL | Status: DC

## 2011-07-04 NOTE — Patient Instructions (Addendum)
Please go to the basement level to have your labs drawn.  We have scheduled the colonoscopy/Endoscopy with Dr. Melvia Heaps on 07-18-2011. Directions and brochure provided. We have sent the prescription for the Moviprep to [facility] in [place]. We have also given you a $20.00 rebate coupon for the prep prescription. Keep your receipt from the pharmacy for the prep. You will need it for the rebater,.

## 2011-07-05 ENCOUNTER — Telehealth: Payer: Self-pay | Admitting: *Deleted

## 2011-07-05 ENCOUNTER — Encounter: Payer: Self-pay | Admitting: Physician Assistant

## 2011-07-05 NOTE — Progress Notes (Addendum)
Subjective:    Patient ID: Paula Steele, female    DOB: 1946-01-30, 65 y.o.   MRN: [id number]  HPI Paula Steele is a 65 year old female known to Dr. 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 She last had colonoscopy in 2004 and did have adenomatous polyps removed at that time. Upper endoscopy in 2004 negative She was last seen in 2009 and was to have followup exams but these were not pursued. At this time she comes in with complaints of left lower quadrant pain over the past 3 months which she describes as constant and achy in nature. She has not had any change in her bowel habits, no melena or hematochezia. She does have occasional constipation for which she uses flaxseed. She also mentioned some recent epigastric pain with nausea and no vomiting. She says she has constant pain on the right to the left side of her neck since she had surgery for her cancer. She denies any heartburn or indigestion  She does stay on chronic omeprazole 20 mg by mouth daily, and usually takes one or 2 Advil per day.  Patient also had a recent CT scan of the abdomen and pelvis done 06/25/2011 showed a stable 2.1 cm left adrenal adenoma and a slightly lobulated hepatic margin with enlargement of the left lobe and caudate lobe raising the question of cirrhosis, there is no splenomegaly or ascites.  Review of her old records shows that she did have a liver biopsy done in 2001 that showed steatohepatitis with fibrosis consistent with early cirrhosis.    Review of Systems  Constitutional: Negative.   HENT: Negative.   Eyes: Negative.   Respiratory: Negative.   Cardiovascular: Negative.   Gastrointestinal: Positive for nausea, abdominal pain and constipation.  Genitourinary: Negative.   Musculoskeletal: Positive for arthralgias.  Skin: Negative.   Neurological: Negative.   Hematological: Negative.     Psychiatric/Behavioral: Negative.    No Known Allergies  Outpatient Encounter Prescriptions as of 07/04/2011  Medication Sig Dispense Refill  . cetirizine (ZYRTEC) 10 MG tablet Take 10 mg by mouth daily.        Marland Kitchen glimepiride (AMARYL) 4 MG tablet Take 4 mg by mouth daily before breakfast.        . Hydrocodone-Acetaminophen (VICODIN HP) 10-660 MG TABS Take 1 tablet by mouth as needed.        Marland Kitchen ibuprofen (ADVIL,MOTRIN) 800 MG tablet Take 800 mg by mouth every 8 (eight) hours as needed.        . insulin glargine (LANTUS) 100 UNIT/ML injection Inject 60 Units into the skin at bedtime. Sliding scale in the mornings      . omeprazole (PRILOSEC) 20 MG capsule Take 20 mg by mouth daily.        . rosuvastatin (CRESTOR) 5 MG tablet Take 5 mg by mouth daily.        . traZODone (DESYREL) 25 mg TABS Take by mouth at bedtime.        Marland Kitchen zolpidem (AMBIEN CR) 12.5 MG CR tablet Take 12.5 mg by mouth at bedtime as needed.        . peg 3350 powder (MOVIPREP) 100 G SOLR Take as directed. Moviprep  1 kit  0  . DISCONTD: exenatide (BYETTA 5 MCG PEN) 5 MCG/0.02ML SOLN Inject 10 mcg into the skin 2 (two) times daily with a meal.        . DISCONTD: pantoprazole (PROTONIX)  40 MG tablet Take 40 mg by mouth daily.             Patient Active Problem List  Diagnoses  . NEOPLASM, MALIGNANT, ORAL CAVITY  . COLONIC POLYPS  . BENIGN NEOPLASM OF ADRENAL GLAND  . DM  . DEPRESSION  . CAD  . GERD  . GASTROPARESIS  . DIVERTICULOSIS OF COLON  . OTHER CHRONIC NONALCOHOLIC LIVER DISEASE  . DEGENERATIVE DISC DISEASE    Objective:   Physical Exam a well-developed white female in no acute distress, alert and oriented x3, blood pressure 114/82 pulse 76 HEENT; nonicteric normocephalic EOMI PERRLA sclera anicteric, Neck; supple she has incisional scars on the left anterior neck, Cardiovascular; regular rate and rhythm with S1-S2 no murmur gallop, Pulmonary; clear bilaterally, Abdomen; large soft she is tender in the  epigastrium, she does have an enlarged palpable right lobe of the liver down 3 fingerbreadths below the right costal margin I cannot palpate the left lobe no palpable splenomegaly. She is tender in the left mid quadrant and left lower quadrant as well no guarding or rebound, bowel sounds are active, Rectal; not done, Extremities; no clubbing cyanosis or edema skin warm and dry, no obvious stigmata of chronic liver disease, Psych; mood and affect normal and appropriate        Assessment & Plan:  #22 65 year old female with a 3 month history of left lower quadrant pain,, etiology not clear area to She does have history of adenomatous colon polyps and is overdue for colonoscopy.  Plan; Schedule call for colonoscopy with Dr. Arlyce Dice, procedure was discussed in detail with the patient Recent CT scan reviewed as above  #2 Epigastric pain, rule out NSAID-induced gastropathy or peptic ulcer disease.  Plan; Advised patient to stop NSAID use  Continue Prilosec 20 mg once daily Schedule for upper endoscopy with Dr. Arlyce Dice, again procedure discussed in detail with the patient  #3 NASH with cirrhosis No evidence for progression by CT scan Will check hepatic synthetic function with labs today  #4 adult-onset diabetes mellitus  #5 Coronary artery disease  #6 history of oral pharyngeal cancer status post resection approximately 5 years ago  Agree with above plan for EGD/colon as described in the note. Reviewed and agree with management. Erick Blinks, MD   07/08/2011

## 2011-07-05 NOTE — Telephone Encounter (Signed)
Spoke to the patient and apologized that I was called into another room and someone released her from the office visit. I advised I needed to let her know she needed to go to the lab.  I asked when she could come and she said early in the week. I asked if she could come Mon and she thought she could.

## 2011-07-08 ENCOUNTER — Encounter: Payer: Self-pay | Admitting: Gastroenterology

## 2011-07-12 ENCOUNTER — Other Ambulatory Visit (INDEPENDENT_AMBULATORY_CARE_PROVIDER_SITE_OTHER): Payer: Medicare Other

## 2011-07-12 DIAGNOSIS — R1032 Left lower quadrant pain: Secondary | ICD-10-CM

## 2011-07-12 DIAGNOSIS — R1013 Epigastric pain: Secondary | ICD-10-CM

## 2011-07-12 DIAGNOSIS — K7460 Unspecified cirrhosis of liver: Secondary | ICD-10-CM

## 2011-07-12 DIAGNOSIS — Z860100 Personal history of colon polyps, unspecified: Secondary | ICD-10-CM

## 2011-07-12 LAB — FERRITIN: Ferritin: 91.3 ng/mL (ref 10.0–291.0)

## 2011-07-12 LAB — PROTIME-INR
INR: 1.1 ratio — ABNORMAL HIGH (ref 0.8–1.0)
Prothrombin Time: 12.5 s — ABNORMAL HIGH (ref 10.2–12.4)

## 2011-07-12 LAB — HEPATIC FUNCTION PANEL
ALT: 56 U/L — ABNORMAL HIGH (ref 0–35)
AST: 50 U/L — ABNORMAL HIGH (ref 0–37)
Albumin: 3.7 g/dL (ref 3.5–5.2)
Alkaline Phosphatase: 98 U/L (ref 39–117)
Bilirubin, Direct: 0.1 mg/dL (ref 0.0–0.3)
Total Bilirubin: 0.7 mg/dL (ref 0.3–1.2)
Total Protein: 7.8 g/dL (ref 6.0–8.3)

## 2011-07-12 LAB — CBC WITH DIFFERENTIAL/PLATELET
Basophils Absolute: 0 10*3/uL (ref 0.0–0.1)
Basophils Relative: 0.5 % (ref 0.0–3.0)
Eosinophils Absolute: 0.1 10*3/uL (ref 0.0–0.7)
Eosinophils Relative: 1.2 % (ref 0.0–5.0)
HCT: 44 % (ref 36.0–46.0)
Hemoglobin: 15.1 g/dL — ABNORMAL HIGH (ref 12.0–15.0)
Lymphocytes Relative: 32.4 % (ref 12.0–46.0)
Lymphs Abs: 1.9 10*3/uL (ref 0.7–4.0)
MCHC: 34.3 g/dL (ref 30.0–36.0)
MCV: 86.3 fl (ref 78.0–100.0)
Monocytes Absolute: 0.2 10*3/uL (ref 0.1–1.0)
Monocytes Relative: 4.3 % (ref 3.0–12.0)
Neutro Abs: 3.6 10*3/uL (ref 1.4–7.7)
Neutrophils Relative %: 61.6 % (ref 43.0–77.0)
Platelets: 153 10*3/uL (ref 150.0–400.0)
RBC: 5.09 Mil/uL (ref 3.87–5.11)
RDW: 13.1 % (ref 11.5–14.6)
WBC: 5.8 10*3/uL (ref 4.5–10.5)

## 2011-07-15 ENCOUNTER — Telehealth: Payer: Self-pay | Admitting: *Deleted

## 2011-07-15 NOTE — Telephone Encounter (Signed)
Message copied by Daphine Deutscher on Mon Jul 15, 2011 10:19 AM ------      Message from: [place], [place] S      Created: Mon Jul 15, 2011  9:23 AM       Please let Paula Steele know her labs are ok, liver tests mildly elevated consistent with her liver disease.

## 2011-07-15 NOTE — Telephone Encounter (Signed)
Left a message on cell phone for patient to call me. Unable to leave a message at home number because mailbox is full

## 2011-07-17 NOTE — Telephone Encounter (Signed)
Spoke with patient and gave her results as per Amy Esterwood, PA. 

## 2011-07-18 ENCOUNTER — Encounter: Payer: Self-pay | Admitting: Gastroenterology

## 2011-07-18 ENCOUNTER — Ambulatory Visit (AMBULATORY_SURGERY_CENTER): Payer: Medicare Other | Admitting: Gastroenterology

## 2011-07-18 VITALS — BP 146/91 | HR 95 | Temp 98.7°F | Resp 21 | Ht 68.0 in | Wt 268.0 lb

## 2011-07-18 DIAGNOSIS — K5730 Diverticulosis of large intestine without perforation or abscess without bleeding: Secondary | ICD-10-CM

## 2011-07-18 DIAGNOSIS — D126 Benign neoplasm of colon, unspecified: Secondary | ICD-10-CM

## 2011-07-18 DIAGNOSIS — R1032 Left lower quadrant pain: Secondary | ICD-10-CM

## 2011-07-18 DIAGNOSIS — Z1211 Encounter for screening for malignant neoplasm of colon: Secondary | ICD-10-CM

## 2011-07-18 DIAGNOSIS — Z86010 Personal history of colonic polyps: Secondary | ICD-10-CM

## 2011-07-18 DIAGNOSIS — K7460 Unspecified cirrhosis of liver: Secondary | ICD-10-CM

## 2011-07-18 LAB — GLUCOSE, CAPILLARY
Glucose-Capillary: 129 mg/dL — ABNORMAL HIGH (ref 70–99)
Glucose-Capillary: 97 mg/dL (ref 70–99)

## 2011-07-18 MED ORDER — SODIUM CHLORIDE 0.9 % IV SOLN: 500.0000 mL | INTRAVENOUS | Status: DC

## 2011-07-18 NOTE — Progress Notes (Signed)
Patient keeps asking for copies of her bloodwork.   I explained that she had to sign a release in order for [place] to give her a copy.   She is now under the influence of medication and can't sign today.   She is angry about that.   I asked her if she had mentioned this in admitting, and she said, " No." I told her that we would be happy to give her the results another day when she was not under the influence and signed the release.   She is now commenting to her husband that "she didn't know she was going to be done here."   According to Dr. Arlyce Dice, she had the choice of having it done at the hospital around 6:30pm or here.   She chose to have it done here.

## 2011-07-18 NOTE — Progress Notes (Signed)
Medicines titrated per MD with procedure. Pt resting after reaching cecum with eyes open, stated was comfortable, no pain noted. Pt watched monitor with procedure, no distress. EWM

## 2011-07-18 NOTE — Patient Instructions (Signed)
Diverticulosis Diverticulosis is a common condition that develops when small pouches (diverticula) form in the wall of the colon. The risk of diverticulosis increases with age. It happens more often in people who eat a low-fiber diet. Most individuals with diverticulosis have no symptoms. Those individuals with symptoms usually experience belly (abdominal) pain, constipation, or loose stools (diarrhea). HOME CARE INSTRUCTIONS  Increase the amount of fiber in your diet as directed by your caregiver or dietician. This may reduce symptoms of diverticulosis.   Your caregiver may recommend taking a dietary fiber supplement.   Drink at least 6 to 8 glasses of water each day to prevent constipation.   Try not to strain when you have a bowel movement.   Your caregiver may recommend avoiding nuts and seeds to prevent complications, although this is still an uncertain benefit.   Only take over-the-counter or prescription medicines for pain, discomfort, or fever as directed by your caregiver.  FOODS HAVING HIGH FIBER CONTENT INCLUDE:  Fruits. Apple, peach, pear, tangerine, raisins, prunes.   Vegetables. Brussels sprouts, asparagus, broccoli, cabbage, carrot, cauliflower, romaine lettuce, spinach, summer squash, tomato, winter squash, zucchini.   Starchy Vegetables. Baked beans, kidney beans, lima beans, split peas, lentils, potatoes (with skin).   Grains. Whole wheat bread, brown rice, bran flake cereal, plain oatmeal, white rice, shredded wheat, bran muffins.  SEEK IMMEDIATE MEDICAL CARE IF:  You develop increasing pain or severe bloating.   You have an oral temperature above 100, not controlled by medicine.   You develop vomiting or bowel movements that are bloody or black.  Document Released: 07/04/2004 Document Re-Released: 03/27/2010 [facility] Patient Information 2011 [place], [place]  .Polyps, Colon  A polyp is extra tissue that grows inside your body. Colon polyps grow in the large  intestine. The large intestine, also called the colon, is part of your digestive system. It is a long, hollow tube at the end of your digestive tract where your body makes and stores stool. Most polyps are not dangerous. They are benign. This means they are not cancerous. But over time, some types of polyps can turn into cancer. Polyps that are smaller than a pea are usually not harmful. But larger polyps could someday become or may already be cancerous. To be safe, doctors remove all polyps and test them.  WHO GETS POLYPS? Anyone can get polyps, but certain people are more likely than others. You may have a greater chance of getting polyps if:  You are over 50.   You have had polyps before.   Someone in your family has had polyps.   Someone in your family has had cancer of the large intestine.   Find out if someone in your family has had polyps. You may also be more likely to get polyps if you:   Eat a lot of fatty foods   Smoke   Drink alcohol   Do not exercise  Eat too much  SYMPTOMS Most small polyps do not cause symptoms. People often do not know they have one until their caregiver finds it during a regular checkup or while testing them for something else. Some people do have symptoms like these:  Bleeding from the anus. You might notice blood on your underwear or on toilet paper after you have had a bowel movement.   Constipation or diarrhea that lasts more than a week.   Blood in the stool. Blood can make stool look black or it can show up as red streaks in the stool.  If  you have any of these symptoms, see your caregiver. HOW DOES THE DOCTOR TEST FOR POLYPS? The doctor can use four tests to check for polyps:  Digital rectal exam. The caregiver wears gloves and checks your rectum (the last part of the large intestine) to see if it feels normal. This test would find polyps only in the rectum. Your caregiver may need to do one of the other tests listed below to find polyps  higher up in the intestine.   Barium enema. The caregiver puts a liquid called barium into your rectum before taking x-rays of your large intestine. Barium makes your intestine look white in the pictures. Polyps are dark, so they are easy to see.   Sigmoidoscopy. With this test, the caregiver can see inside your large intestine. A thin flexible tube is placed into your rectum. The device is called a sigmoidoscope, which has a light and a tiny video camera in it. The caregiver uses the sigmoidoscope to look at the last third of your large intestine.   Colonoscopy. This test is like sigmoidoscopy, but the caregiver looks at all of the large intestine. It usually requires sedation. This is the most common method for finding and removing polyps.  TREATMENT  The caregiver will remove the polyp during sigmoidoscopy or colonoscopy. The polyp is then tested for cancer.   If you have had polyps, your caregiver may want you to get tested regularly in the future.  PREVENTION There is not one sure way to prevent polyps. You might be able to lower your risk of getting them if you:  Eat more fruits and vegetables and less fatty food.   Do not smoke.   Avoid alcohol.   Exercise every day.   Lose weight if you are overweight.   Eating more calcium and folate can also lower your risk of getting polyps. Some foods that are rich in calcium are milk, cheese, and broccoli. Some foods that are rich in folate are chickpeas, kidney beans, and spinach.   Aspirin might help prevent polyps. Studies are under way.  Document Released: 07/03/2004 Document Re-Released: 03/27/2010 [facility] Patient Information 2011 [place], [place].

## 2011-07-19 ENCOUNTER — Telehealth: Payer: Self-pay | Admitting: *Deleted

## 2011-07-19 NOTE — Telephone Encounter (Signed)
Follow up Call- Patient questions:  Do you have a fever, pain , or abdominal swelling? no Pain Score  0 *  Have you tolerated food without any problems? yes  Have you been able to return to your normal activities? yes  Do you have any questions about your discharge instructions: Diet   no Medications  no Follow up visit  no  Do you have questions or concerns about your Care? no  Actions: * If pain score is 4 or above: No action needed, pain <4.  Reviewed with patient to call for an appointment with Dr. Arlyce Dice in 3-4 weeks and to avoid NSAIDS for two weeks.

## 2011-08-12 ENCOUNTER — Emergency Department (HOSPITAL_COMMUNITY): Payer: Medicare Other

## 2011-08-12 ENCOUNTER — Emergency Department (HOSPITAL_COMMUNITY)
Admission: EM | Admit: 2011-08-12 | Discharge: 2011-08-12 | Disposition: A | Discharge status: Home / Self Care | Payer: Medicare Other | Attending: Emergency Medicine | Admitting: Emergency Medicine

## 2011-08-12 DIAGNOSIS — M545 Low back pain, unspecified: Secondary | ICD-10-CM | POA: Insufficient documentation

## 2011-08-12 DIAGNOSIS — M25559 Pain in unspecified hip: Secondary | ICD-10-CM | POA: Insufficient documentation

## 2011-08-12 DIAGNOSIS — M25569 Pain in unspecified knee: Secondary | ICD-10-CM | POA: Insufficient documentation

## 2011-08-12 DIAGNOSIS — I10 Essential (primary) hypertension: Secondary | ICD-10-CM | POA: Insufficient documentation

## 2011-08-12 DIAGNOSIS — Z794 Long term (current) use of insulin: Secondary | ICD-10-CM | POA: Insufficient documentation

## 2011-08-12 DIAGNOSIS — Y9229 Other specified public building as the place of occurrence of the external cause: Secondary | ICD-10-CM | POA: Insufficient documentation

## 2011-08-12 DIAGNOSIS — W050XXA Fall from non-moving wheelchair, initial encounter: Secondary | ICD-10-CM | POA: Insufficient documentation

## 2011-08-12 DIAGNOSIS — E119 Type 2 diabetes mellitus without complications: Secondary | ICD-10-CM | POA: Insufficient documentation

## 2011-08-12 LAB — GLUCOSE, CAPILLARY: Glucose-Capillary: 76 mg/dL (ref 70–99)

## 2011-08-15 ENCOUNTER — Encounter: Payer: Self-pay | Admitting: Neurology

## 2011-08-15 ENCOUNTER — Ambulatory Visit (INDEPENDENT_AMBULATORY_CARE_PROVIDER_SITE_OTHER): Payer: Medicare Other | Admitting: Gastroenterology

## 2011-08-15 ENCOUNTER — Encounter: Payer: Self-pay | Admitting: Gastroenterology

## 2011-08-15 DIAGNOSIS — R1032 Left lower quadrant pain: Secondary | ICD-10-CM

## 2011-08-15 DIAGNOSIS — E669 Obesity, unspecified: Secondary | ICD-10-CM

## 2011-08-15 DIAGNOSIS — K7460 Unspecified cirrhosis of liver: Secondary | ICD-10-CM

## 2011-08-15 DIAGNOSIS — Z860100 Personal history of colon polyps, unspecified: Secondary | ICD-10-CM

## 2011-08-15 DIAGNOSIS — D126 Benign neoplasm of colon, unspecified: Secondary | ICD-10-CM

## 2011-08-15 MED ORDER — HYOSCYAMINE SULFATE ER 0.375 MG PO TBCR: EXTENDED_RELEASE_TABLET | ORAL | Status: DC

## 2011-08-15 NOTE — Assessment & Plan Note (Signed)
Plan followup colonoscopy in 5 years 

## 2011-08-15 NOTE — Assessment & Plan Note (Signed)
She has stable hepatic function

## 2011-08-15 NOTE — Assessment & Plan Note (Signed)
The patient has expressed interest in bariatric surgery. She will be referred to the surgical group.

## 2011-08-15 NOTE — Assessment & Plan Note (Signed)
Etiology unclear.  Recommendations #1 trial of hyomax

## 2011-08-15 NOTE — Patient Instructions (Addendum)
We are giving you information on Bariatric Surgery

## 2011-08-15 NOTE — Progress Notes (Signed)
History of Present Illness:  Paula Steele has returned following upper and lower endoscopy. The former was normal. In the latter diverticulosis and colon polyps were seen and removed. She still complains of relatively persistent left lower quadrant pain. It is unrelated to eating or bowel movements.    Review of Systems: Pertinent positive and negative review of systems were noted in the above HPI section. All other review of systems were otherwise negative.    Current Medications, Allergies, Past Medical History, Past Surgical History, Family History and Social History were reviewed in [facility] electronic medical record  Vital signs were reviewed in today's medical record. Physical Exam: General: Well developed , well nourished, no acute distress ct

## 2011-08-30 ENCOUNTER — Encounter: Payer: Self-pay | Admitting: Neurology

## 2011-08-30 ENCOUNTER — Ambulatory Visit (INDEPENDENT_AMBULATORY_CARE_PROVIDER_SITE_OTHER): Payer: Medicare Other | Admitting: Neurology

## 2011-08-30 DIAGNOSIS — R29898 Other symptoms and signs involving the musculoskeletal system: Secondary | ICD-10-CM

## 2011-08-30 DIAGNOSIS — M79609 Pain in unspecified limb: Secondary | ICD-10-CM

## 2011-08-30 DIAGNOSIS — M79602 Pain in left arm: Secondary | ICD-10-CM

## 2011-08-30 NOTE — Patient Instructions (Signed)
We will call you with your appointment to have the nerve conduction studies done.  They will be done at  [facility] [address] [place] [phone].  We will see you back here about a week after that appointment.

## 2011-08-30 NOTE — Progress Notes (Signed)
Dear Dr. Charm Barges,  Thank you for having me see Paula Steele in consultation today at [facility] for her problem with left neck and shoulder pain.  As you may recall, she is a 65 y.o. year old female with a history of oral cancer s/p radical neck dissection who has had left neck, shoulder and arm pain since she had her surgery five years ago.  She feels that the left arm is week.  She has been treated with neurontin at night, but when this was taken during the day it resulted in extreme fatigue.  She has not had an EMG/NCS of the arm.  Her surgeon thought there was "nerve damage".  She is extremely resistant to take medications as she does not want to be a "zombie" like her husband who takes neurontin and opiates.  Past Medical History  Diagnosis Date  . Diverticulosis of colon (without mention of hemorrhage)   . Personal history of other diseases of digestive system   . Esophageal reflux   . Other chronic nonalcoholic liver disease   . Gastroparesis   . Malignant neoplasm of mouth, unspecified site   . Depressive disorder, not elsewhere classified   . Degeneration of intervertebral disc, site unspecified   . Benign neoplasm of adrenal gland   . Coronary atherosclerosis of unspecified type of vessel, native or graft   . Diabetes mellitus without mention of complication   . Benign neoplasm of colon     Past Surgical History  Procedure Date  . Abdominal hysterectomy 1984    Lt. salpingo-oophorectomy  . Shoulder surgery   . Cholecystectomy   . Partial tongue resection   . Laparoscopic salpingoopherectomy     Rt  . Abdominal adhesion surgery     History   Social History  . Marital Status: Married    Spouse Name: N/A    Number of Children: 4  . Years of Education: N/A   Occupational History  . Disabled    Social History Main Topics  . Smoking status: Never Smoker   . Smokeless tobacco: Never Used  . Alcohol Use: No  . Drug Use: No  . Sexually Active: None    Other Topics Concern  . None   Social History Narrative  . None    Family History  Problem Relation Age of Onset  . Thyroid cancer Brother   . Diabetes Mother   . Diabetes Brother   . Heart disease Father   . Heart disease Brother       ROS:  13 systems were reviewed and are notable for numbness in her feet.  All other review of systems are unremarkable.   Examination:  Filed Vitals:   08/30/11 1453  BP: 124/78  Pulse: 96  Weight: 275 lb (124.739 kg)     In general, obese appearing unkempt women in NAD.  H&N:  obvious resection scar in her left posterior oropharynx as well as scar and reduce neck muscle bulk from radical neck dissection.  No obvious significant tenderness of the neck or trapezius.  Cardiovascular: The patient has a regular rate and rhythm and no carotid bruits.  Fundoscopy:  Disks are flat. Vessel caliber within normal limits.  Mental status:   The patient is oriented to person, place and time. Recent and remote memory are intact. Attention span and concentration are normal. Language including repetition, naming, following commands are intact. Fund of knowledge of current and historical events, as well as vocabulary are normal.  Cranial Nerves:  Pupils are equally round and reactive to light. Visual fields full to confrontation. Extraocular movements are intact without nystagmus. Facial sensation and muscles of mastication are intact. Muscles of facial expression are symmetric. Hearing intact to bilateral finger rub. Tongue protrusion, uvula, palate midline.  Shoulder shrug intact delayed on the left  Motor:  The patient has reduce  bulk of the left deltoid, no pronator drift.  There are no adventitious movements.   SA EF EE FA HF KF KE FDF FPF Right 5 5 5 5 5 5 5 5 5  Left 4+ 5 5 5 5 5 5 5 5   Reflexes:   Biceps  Triceps Brachioradialis Knee Ankle  Right 2+  2+  2+   2+ 1+  Left  2+  2+  2+   2+ 1+  Toes down  Coordination:  Normal finger  to nose.  No dysdiadokinesia.  Sensation patchy sensory loss in the left arm, no obvious dermatomal pattern could be discerned.  Gait and Station are normal.  Tandem gait is intact.  Romberg is negative  C-spine MRI report reviewed.  Very unremarkable scan with no obvious neural element impingement.  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  In truth I don't see the necessity of further investigation, but offered the patient an EMG/NCS to confirm her injury.  Pain control using Lyrica would be a good option, but she is very resistant to any medications that might affect her.  I emphasized that the patient is unlikely to get good pain control without oral medications but a this time is not interested.  I am hopeful that I can convince her to try Lyrica in the future.  I am going to look into any interventional pain options as well.  We will see the patient back in 6 weeks.  Thank you for having [place] see Paula Steele in consultation.  Feel free to contact me with any questions.  Lupita Raider Modesto Charon, MD [facility], [place] [address] [place], [place] [address] Phone: [phone] Fax: [phone].  - no nerve tests  - carcinoma   - pain in the shoulder blade  - burning into shoulder, weak in the arm  -

## 2011-09-14 ENCOUNTER — Other Ambulatory Visit: Payer: Self-pay | Admitting: Orthopedic Surgery

## 2011-09-23 ENCOUNTER — Encounter (HOSPITAL_BASED_OUTPATIENT_CLINIC_OR_DEPARTMENT_OTHER): Payer: Self-pay | Admitting: *Deleted

## 2011-09-23 NOTE — Progress Notes (Addendum)
NPO AFTER MN WITH EXCEPTION CLEAR LIQUIDS UNTIL 0730 (NO CREAM/ MILK PRODUCTS).  PT ARRIVES AT 1145. WILL TAKE PRILOSEC AND IF NEEDED HYDROCODONE AM OF SURG. W/ SIP OF WATER.  PAGED DREW PERKINS PA FOR DR Lequita Halt, ? ABOUT ORDERS.   REVIEWED CHART W/ DR GOSE MDA, OK TO PROCEED.  RECEIVED CALL BACK FROM DREW PERKINS PA, QUESTIONS ANSWERED ABOUT ORDERS,  ADDED ANCEF, LABS PER ANES.Marland Kitchen    PT NEEDS CXR, EKG AND ISTAT.

## 2011-09-25 ENCOUNTER — Encounter (HOSPITAL_BASED_OUTPATIENT_CLINIC_OR_DEPARTMENT_OTHER): Payer: Self-pay | Admitting: Orthopedic Surgery

## 2011-09-25 NOTE — H&P (Signed)
CC- Paula Steele is a 65 y.o. female who presents with right knee pain.  HPI- . Knee Pain: Patient presents with a knee injury involving the  right knee. Onset of the symptoms was about a month ago. Inciting event: injured while falling. Current symptoms include foreign body sensation, giving out and pain located medially. Pain is aggravated by any weight bearing, going up and down stairs, inactivity, kneeling, rising after sitting, squatting, standing and walking.  Patient has had prior knee problems. Evaluation to date: MRI: abnormal medial meniscal tear. Treatment to date: avoidance of offending activity, ice, OTC analgesics which are not very effective and rest.  Past Medical History  Diagnosis Date  . Diverticulosis of colon (without mention of hemorrhage)   . Personal history of other diseases of digestive system     OCCASIONAL ABD. PAIN  . Other chronic nonalcoholic liver disease     SEES DR Arlyce Dice-- ?SCARRING OR CIRRHOSIS PER PET SCAN 2010  . Depressive disorder, not elsewhere classified   . Benign neoplasm of adrenal gland   . Benign neoplasm of colon   . Colon polyp   . Diabetes mellitus without mention of complication     INSULIN AND ORAL MEDS  . 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 Hyperlipidemia   . Vitamin D deficiency   . Neck pain, chronic S/P RADIAL NECK DISSECTION--  2007    LEFT SIDE OF NECK AND SHOULDER PAIN  . Insomnia DUE TO STRESS    TAKES MED  . Shortness of breath on exertion   . Esophageal reflux     CONTROLLED W/ PRILOSEC  . Oropharyngeal dysphagia MILD RIGHT SUPRAGLOTTIC REGION    PT STATES SHE EATS VERY SLOWLY AND CHEWS FOOD WELL  . Weakness of both legs   . Degeneration of intervertebral disc, site unspecified     LUMBAR  . Arthritis      SHOULDERS--   IS CURRENTLY HAS LEFT SHOULDER PAIN  . 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 Shoulder surgery 1990    LEFT  . Laparoscopic salpingoopherectomy 2001    RIGHT  W/ LYSIS AHESIONS  . Abdominal hysterectomy 1984    W/  LSO  . Neck mass excision 2007    LEFT  . Radical neck dissection 2007    W/ LEFT TONSILLECTOMY AND LEFT EXCISION LEFT TONGUE BASE DUE TO CANCER  . Direct laryngoscopy 2007    W/ LEFT NECK MASS EXCISION  . Tonsillectomy 01-31-2009    RIGHT (DUE TO MASS)  . Tonsillectomy 2007    LEFT W/ NECK DISSECTION DUE TO CANCER  . Cholecystectomy 15 YRS AGO  . Excision left sebacous cyst 05-14-2011 W/  LOCAL  . Cataract extraction w/ intraocular lens  implant, bilateral     Prior to Admission medications   Medication Sig Start Date End Date Taking? Authorizing Provider  diazepam (VALIUM) 10 MG tablet Take 10 mg by mouth every 6 (six) hours as needed.     Yes Historical Provider, MD  glimepiride (AMARYL) 4 MG tablet Take 4 mg by mouth 2 (two) times daily.    Yes Historical Provider, MD  Hydrocodone-Acetaminophen (VICODIN HP) 10-660 MG TABS Take 1 tablet by mouth as needed.    Yes Historical Provider, MD  Hyoscyamine Sulfate 0.375 MG TBCR as  needed. Take one tab twice a day for abdominal pain for 3-4 days then as needed  08/15/11  Yes Louis Meckel, MD  ibuprofen (ADVIL,MOTRIN) 800 MG tablet Take 800 mg by mouth every 8 (eight) hours as needed.    Yes Historical Provider, MD  insulin glargine (LANTUS) 100 UNIT/ML injection Inject 60 Units into the skin 2 (two) times daily. Sliding scale in the mornings   Yes Historical Provider, MD  NOVOLOG FLEXPEN 100 UNIT/ML injection Inject into the skin 3 (three) times daily before meals. PER SS 06/25/11  Yes Historical Provider, MD  omeprazole (PRILOSEC) 20 MG capsule Take 20 mg by mouth every morning.    Yes Historical Provider, MD  promethazine (PHENERGAN) 25 MG tablet 25 mg every 6 (six) hours as needed.  06/24/11  Yes Historical Provider, MD  rosuvastatin (CRESTOR) 5 MG tablet  Take 5 mg by mouth every evening.    Yes Historical Provider, MD  tiZANidine (ZANAFLEX) 4 MG tablet 4 mg every 6 (six) hours as needed.  06/24/11  Yes Historical Provider, MD  triamcinolone (KENALOG) 0.1 % cream Apply 1 application topically as needed.  05/25/11  Yes Historical Provider, MD  Vitamin D, Ergocalciferol, (DRISDOL) 50000 UNITS CAPS Take 50,000 Units by mouth every 7 (seven) days.     Yes Historical Provider, MD  zolpidem (AMBIEN CR) 12.5 MG CR tablet Take 12.5 mg by mouth at bedtime as needed.    Yes Historical Provider, MD  BYDUREON 2 MG SUSR  06/24/11   Historical Provider, MD   KNEE EXAM antalgic gait, soft tissue tenderness over medial joint line, reduced range of motion, negative drawer sign, negative pivot-shift, collateral ligaments intact, positive McMurray sign, normal ipsilateral hip exam  Physical Examination: General appearance - alert, well appearing, and in no distress Mental status - alert, oriented to person, place, and time Chest - clear to auscultation, no wheezes, rales or rhonchi, symmetric air entry Heart - normal rate, regular rhythm, normal S1, S2, no murmurs, rubs, clicks or gallops Abdomen - soft, nontender, nondistended, no masses or organomegaly Neurological - alert, oriented, normal speech, no focal findings or movement disorder noted   Asessment/Plan---Right knee medial meniscal tear- - Plan rightt knee arthroscopy with meniscal debridement. Procedure risks and potential comps discussed with patient who elects to proceed. Goals are decreased pain and increased function with a high likelihood of achieving both

## 2011-09-26 ENCOUNTER — Other Ambulatory Visit: Payer: Self-pay

## 2011-09-26 ENCOUNTER — Ambulatory Visit (HOSPITAL_BASED_OUTPATIENT_CLINIC_OR_DEPARTMENT_OTHER)
Admission: RE | Admit: 2011-09-26 | Discharge: 2011-09-26 | Disposition: A | Discharge status: Home / Self Care | Payer: Medicare Other | Source: Ambulatory Visit | Attending: Orthopedic Surgery | Admitting: Orthopedic Surgery

## 2011-09-26 ENCOUNTER — Ambulatory Visit (HOSPITAL_BASED_OUTPATIENT_CLINIC_OR_DEPARTMENT_OTHER): Payer: Medicare Other | Admitting: Anesthesiology

## 2011-09-26 ENCOUNTER — Ambulatory Visit (HOSPITAL_COMMUNITY): Payer: Medicare Other

## 2011-09-26 ENCOUNTER — Encounter (HOSPITAL_BASED_OUTPATIENT_CLINIC_OR_DEPARTMENT_OTHER)
Admission: RE | Disposition: A | Discharge status: Home / Self Care | Payer: Self-pay | Source: Ambulatory Visit | Attending: Orthopedic Surgery

## 2011-09-26 ENCOUNTER — Encounter (HOSPITAL_BASED_OUTPATIENT_CLINIC_OR_DEPARTMENT_OTHER): Payer: Self-pay | Admitting: *Deleted

## 2011-09-26 ENCOUNTER — Encounter (HOSPITAL_BASED_OUTPATIENT_CLINIC_OR_DEPARTMENT_OTHER): Payer: Self-pay | Admitting: Anesthesiology

## 2011-09-26 DIAGNOSIS — E785 Hyperlipidemia, unspecified: Secondary | ICD-10-CM | POA: Insufficient documentation

## 2011-09-26 DIAGNOSIS — S83289A Other tear of lateral meniscus, current injury, unspecified knee, initial encounter: Secondary | ICD-10-CM | POA: Insufficient documentation

## 2011-09-26 DIAGNOSIS — E1339 Other specified diabetes mellitus with other diabetic ophthalmic complication: Secondary | ICD-10-CM | POA: Insufficient documentation

## 2011-09-26 DIAGNOSIS — W19XXXA Unspecified fall, initial encounter: Secondary | ICD-10-CM | POA: Insufficient documentation

## 2011-09-26 DIAGNOSIS — E11319 Type 2 diabetes mellitus with unspecified diabetic retinopathy without macular edema: Secondary | ICD-10-CM | POA: Insufficient documentation

## 2011-09-26 DIAGNOSIS — Z79899 Other long term (current) drug therapy: Secondary | ICD-10-CM | POA: Insufficient documentation

## 2011-09-26 DIAGNOSIS — IMO0002 Reserved for concepts with insufficient information to code with codable children: Secondary | ICD-10-CM | POA: Insufficient documentation

## 2011-09-26 DIAGNOSIS — M2390 Unspecified internal derangement of unspecified knee: Secondary | ICD-10-CM | POA: Insufficient documentation

## 2011-09-26 DIAGNOSIS — Z794 Long term (current) use of insulin: Secondary | ICD-10-CM | POA: Insufficient documentation

## 2011-09-26 DIAGNOSIS — M25561 Pain in right knee: Secondary | ICD-10-CM

## 2011-09-26 DIAGNOSIS — K219 Gastro-esophageal reflux disease without esophagitis: Secondary | ICD-10-CM | POA: Insufficient documentation

## 2011-09-26 HISTORY — DX: Malignant neoplasm of tongue, unspecified: C02.9

## 2011-09-26 HISTORY — DX: Cervicalgia: M54.2

## 2011-09-26 HISTORY — DX: Dysphagia, oropharyngeal phase: R13.12

## 2011-09-26 HISTORY — DX: Unspecified osteoarthritis, unspecified site: M19.90

## 2011-09-26 HISTORY — DX: Other specified diabetes mellitus with unspecified diabetic retinopathy without macular edema: E13.319

## 2011-09-26 HISTORY — DX: Hyperlipidemia, unspecified: E78.5

## 2011-09-26 HISTORY — DX: Shortness of breath: R06.02

## 2011-09-26 HISTORY — DX: Other symptoms and signs involving the musculoskeletal system: R29.898

## 2011-09-26 HISTORY — DX: Other chronic pain: G89.29

## 2011-09-26 HISTORY — DX: Insomnia, unspecified: G47.00

## 2011-09-26 HISTORY — DX: Polyp of colon: K63.5

## 2011-09-26 HISTORY — PX: KNEE ARTHROSCOPY: SHX127

## 2011-09-26 LAB — POCT I-STAT 4, (NA,K, GLUC, HGB,HCT)
Glucose, Bld: 193 mg/dL — ABNORMAL HIGH (ref 70–99)
HCT: 45 % (ref 36.0–46.0)
Hemoglobin: 15.3 g/dL — ABNORMAL HIGH (ref 12.0–15.0)
Potassium: 4.4 mEq/L (ref 3.5–5.1)
Sodium: 143 mEq/L (ref 135–145)

## 2011-09-26 LAB — GLUCOSE, CAPILLARY: Glucose-Capillary: 166 mg/dL — ABNORMAL HIGH (ref 70–99)

## 2011-09-26 SURGERY — ARTHROSCOPY, KNEE
Anesthesia: Spinal | Site: Knee | Laterality: Right | Wound class: Clean

## 2011-09-26 MED ORDER — BUPIVACAINE-EPINEPHRINE 0.25% -1:200000 IJ SOLN
INTRAMUSCULAR | Status: DC | PRN
  Administered 2011-09-26: 20 mL

## 2011-09-26 MED ORDER — PROPOFOL 10 MG/ML IV EMUL
INTRAVENOUS | Status: DC | PRN
  Administered 2011-09-26: 50 ug/kg/min via INTRAVENOUS

## 2011-09-26 MED ORDER — FENTANYL CITRATE 0.05 MG/ML IJ SOLN: 25.0000 ug | INTRAMUSCULAR | Status: DC | PRN

## 2011-09-26 MED ORDER — OXYCODONE-ACETAMINOPHEN 5-325 MG PO TABS
1.0000 | ORAL_TABLET | ORAL | Status: DC | PRN
  Administered 2011-09-26: 1 via ORAL

## 2011-09-26 MED ORDER — CHLORHEXIDINE GLUCONATE 4 % EX LIQD: 60.0000 mL | Freq: Once | CUTANEOUS | Status: DC

## 2011-09-26 MED ORDER — MIDAZOLAM HCL 5 MG/5ML IJ SOLN
INTRAMUSCULAR | Status: DC | PRN
  Administered 2011-09-26 (×2): 1 mg via INTRAVENOUS

## 2011-09-26 MED ORDER — FENTANYL CITRATE 0.05 MG/ML IJ SOLN
INTRAMUSCULAR | Status: DC | PRN
  Administered 2011-09-26: 50 ug via INTRAVENOUS

## 2011-09-26 MED ORDER — SODIUM CHLORIDE 0.9 % IR SOLN
Status: DC | PRN
  Administered 2011-09-26: 3

## 2011-09-26 MED ORDER — LACTATED RINGERS IV SOLN
INTRAVENOUS | Status: DC
  Administered 2011-09-26 (×2): via INTRAVENOUS

## 2011-09-26 MED ORDER — PROMETHAZINE HCL 25 MG/ML IJ SOLN: 6.2500 mg | INTRAMUSCULAR | Status: DC | PRN

## 2011-09-26 MED ORDER — ONDANSETRON HCL 4 MG/2ML IJ SOLN
INTRAMUSCULAR | Status: DC | PRN
  Administered 2011-09-26: 4 mg via INTRAVENOUS

## 2011-09-26 MED ORDER — ACETAMINOPHEN 10 MG/ML IV SOLN
1000.0000 mg | Freq: Once | INTRAVENOUS | Status: AC
  Administered 2011-09-26: 1000 mg via INTRAVENOUS

## 2011-09-26 MED ORDER — OXYCODONE-ACETAMINOPHEN 5-325 MG PO TABS: 1.0000 | ORAL_TABLET | ORAL | Status: AC | PRN

## 2011-09-26 MED ORDER — SODIUM CHLORIDE 0.9 % IV SOLN: INTRAVENOUS | Status: DC

## 2011-09-26 MED ORDER — LIDOCAINE IN DEXTROSE 5-7.5 % IV SOLN
INTRAVENOUS | Status: DC | PRN
  Administered 2011-09-26: 70 mg via INTRATHECAL

## 2011-09-26 MED ORDER — CEFAZOLIN SODIUM-DEXTROSE 2-3 GM-% IV SOLR
2.0000 g | Freq: Once | INTRAVENOUS | Status: AC
  Administered 2011-09-26: 2 g via INTRAVENOUS

## 2011-09-26 SURGICAL SUPPLY — 26 items
BANDAGE ELASTIC 6 VELCRO ST LF (GAUZE/BANDAGES/DRESSINGS) ×2 IMPLANT
BLADE 4.2CUDA (BLADE) ×2 IMPLANT
CANISTER SUCT LVC 12 LTR MEDI- (MISCELLANEOUS) ×2 IMPLANT
CLOTH BEACON ORANGE TIMEOUT ST (SAFETY) ×2 IMPLANT
DRAPE ARTHROSCOPY W/POUCH 114 (DRAPES) ×2 IMPLANT
DRSG EMULSION OIL 3X3 NADH (GAUZE/BANDAGES/DRESSINGS) ×2 IMPLANT
DURAPREP 26ML APPLICATOR (WOUND CARE) ×2 IMPLANT
GLOVE BIO SURGEON STRL SZ 6.5 (GLOVE) ×2 IMPLANT
GLOVE BIO SURGEON STRL SZ8 (GLOVE) ×2 IMPLANT
GLOVE INDICATOR 8.0 STRL GRN (GLOVE) ×2 IMPLANT
GOWN PREVENTION PLUS LG XLONG (DISPOSABLE) ×4 IMPLANT
IV NS IRRIG 3000ML ARTHROMATIC (IV SOLUTION) ×6 IMPLANT
KNEE WRAP E Z 3 GEL PACK (MISCELLANEOUS) ×2 IMPLANT
PACK ARTHROSCOPY DSU (CUSTOM PROCEDURE TRAY) ×2 IMPLANT
PACK BASIN DAY SURGERY FS (CUSTOM PROCEDURE TRAY) ×2 IMPLANT
PADDING CAST ABS 4INX4YD NS (CAST SUPPLIES) ×1
PADDING CAST ABS COTTON 4X4 ST (CAST SUPPLIES) ×1 IMPLANT
PADDING CAST COTTON 6X4 STRL (CAST SUPPLIES) ×2 IMPLANT
PADDING WEBRIL 6 STERILE (GAUZE/BANDAGES/DRESSINGS) ×2 IMPLANT
SET ARTHROSCOPY TUBING (MISCELLANEOUS) ×1
SET ARTHROSCOPY TUBING LN (MISCELLANEOUS) ×1 IMPLANT
SPONGE GAUZE 4X4 12PLY (GAUZE/BANDAGES/DRESSINGS) ×2 IMPLANT
SUT ETHILON 4 0 PS 2 18 (SUTURE) ×2 IMPLANT
TOWEL OR 17X24 6PK STRL BLUE (TOWEL DISPOSABLE) ×2 IMPLANT
WAND 90 DEG TURBOVAC W/CORD (SURGICAL WAND) IMPLANT
WATER STERILE IRR 500ML POUR (IV SOLUTION) ×2 IMPLANT

## 2011-09-26 NOTE — Anesthesia Procedure Notes (Addendum)
Spinal  Patient location during procedure: OR Start time: 09/26/2011 2:02 PM Staffing Anesthesiologist: Phillips Grout Performed by: anesthesiologist  Preanesthetic Checklist Completed: patient identified, site marked, surgical consent, pre-op evaluation, timeout performed, IV checked, risks and benefits discussed and monitors and equipment checked Spinal Block Patient position: sitting Prep: Betadine Patient monitoring: heart rate, continuous pulse ox and blood pressure Location: L4-5 Injection technique: single-shot Needle Needle type: Pencan  Needle gauge: 25 G Needle length: 9 cm Additional Notes Expiration date of kit checked and confirmed. Patient tolerated procedure well, without complications.

## 2011-09-26 NOTE — Transfer of Care (Signed)
Immediate Anesthesia Transfer of Care Note  Patient: Paula Steele  Procedure(s) Performed:  ARTHROSCOPY KNEE - right knee arthroscopy with meniscal tear  Patient Location: Patient transported to PACU with oxygen via face mask at 6 Liters / Min  Anesthesia Type: MAC  Level of Consciousness: awake and alert   Airway & Oxygen Therapy: Patient Spontanous Breathing and Patient connected to face mask oxygen 6 liters a minute  Post-op Assessment: Report given to PACU RN and Post -op Vital signs reviewed and stable  Post vital signs: Reviewed and stable  Complications: No apparent anesthesia complications

## 2011-09-26 NOTE — Op Note (Signed)
Preoperative diagnosis-  Right knee medial meniscal tear  Postoperative diagnosis Right- knee medial meniscal tear   Plus Right knee medial chondral defect and lateral meniscal tear  Procedure- Right knee arthroscopy with medial  Meniscal debridement ;lateral meniscal debridement and medial chondroplasty  Surgeon- Gus Rankin. Nollan Muldrow, MD  Anesthesia-Spinal  EBL-  minimal Complications- None  Condition- PACU - hemodynamically stable.  Brief clinical note- -Paula Steele is a 65 y.o.  female with a several month history of right knee pain and mechanical symptoms after a fall. Exam and history suggested medial meniscal tear confirmed by MRI. The patient presents now for arthroscopy and debridement  Procedure in detail -       After successful administration of Spinal anesthetic, a tourmiquet is placed high on the Right  thigh and the Right lower extremity is prepped and draped in the usual sterile fashion. Time out is performed by the surgical team. Standard superomedial and inferolateral portal sites are marked and incisions made with an 11 blade. The inflow cannula is passed through the superomedial portal and camera through the inferolateral portal and inflow is initiated. Arthroscopic visualization proceeds.      The undersurface of the patella and trochlea are visualized and there is grade II and III chondromalacia with no unstable lesions. The medial and lateral gutters are visualized and there are no loose bodies. Flexion and valgus force is applied to the knee and the medial compartment is entered. A spinal needle is passed into the joint through the site marked for the inferomedial portal. A small incision is made and the dilator passed into the joint. The findings for the medial compartment are tear at body and posterior horn of the medial meniscus with a 1 x 2 cm area of chondral defect medially . The tear is debrided to a stable base with baskets and a shaver and sealed off with the  Arthrocare. The shaver is used to debride the unstable cartilage to a stable (bony, cartilaginous) base with stable edges. It is probed and found to be stable.    The intercondylar notch is visualized and the ACL appears normal. The lateral compartment is entered and the findings are tear of the body and posterior horn of the lateral meniscus with no associated chondral lesions. The tear is debrided to a stable base with baskets and a shaver and sealed off with the Arthrocare. It is probed and found to be stable.     The joint is again inspected and there are no other tears, defects or loose bodies identified. The arthroscopic equipment is then removed from the inferior portals which are closed with interrupted 4-0 nylon. 20 ml of .25% Marcaine with epinephrine are injected through the inflow cannula and the cannula is then removed and the portal closed with nylon. The incisions are cleaned and dried and a bulky sterile dressing is applied. The patient is then awakened and transported to recovery in stable condition.   09/26/2011, 2:43 PM

## 2011-09-26 NOTE — Anesthesia Preprocedure Evaluation (Addendum)
Anesthesia Evaluation  Patient identified by MRN, date of birth, ID band Patient awake    Reviewed: Allergy & Precautions, H&P , NPO status , Patient's Chart, lab work & pertinent test results  Airway Mallampati: III TM Distance: <3 FB Neck ROM: Limited    Dental No notable dental hx.    Pulmonary shortness of breath and with exertion, former smoker clear to auscultation  Pulmonary exam normal       Cardiovascular + CAD Regular Normal    Neuro/Psych Negative Neurological ROS  Negative Psych ROS   GI/Hepatic Neg liver ROS, GERD-  ,  Endo/Other  Diabetes mellitus-Morbid obesity  Renal/GU negative Renal ROS  Genitourinary negative   Musculoskeletal negative musculoskeletal ROS (+)   Abdominal (+) obese,   Peds negative pediatric ROS (+)  Hematology negative hematology ROS (+)   Anesthesia Other Findings   Reproductive/Obstetrics negative OB ROS                          Anesthesia Physical Anesthesia Plan  ASA: III  Anesthesia Plan: Spinal   Post-op Pain Management:    Induction:   Airway Management Planned: Simple Face Mask  Additional Equipment:   Intra-op Plan:   Post-operative Plan:   Informed Consent: I have reviewed the patients History and Physical, chart, labs and discussed the procedure including the risks, benefits and alternatives for the proposed anesthesia with the patient or authorized representative who has indicated his/her understanding and acceptance.   Dental advisory given  Plan Discussed with: CRNA  Anesthesia Plan Comments:         Anesthesia Quick Evaluation

## 2011-09-26 NOTE — Interval H&P Note (Signed)
History and Physical Interval Note:  09/26/2011 1:10 PM  Paula Steele  has presented today for surgery, with the diagnosis of right knee medial meniscal tear  The various methods of treatment have been discussed with the patient and family. After consideration of risks, benefits and other options for treatment, the patient has consented to  Procedure(s): ARTHROSCOPY KNEE as a surgical intervention .  The patients' history has been reviewed, patient examined, no change in status, stable for surgery.  I have reviewed the patients' chart and labs.  Questions were answered to the patient's satisfaction.     Loanne Drilling

## 2011-09-26 NOTE — Anesthesia Postprocedure Evaluation (Signed)
  Anesthesia Post-op Note  Patient: Paula Steele  Procedure(s) Performed:  ARTHROSCOPY KNEE - right knee arthroscopy with meniscal tear  Patient Location: PACU  Anesthesia Type: Spinal  Level of Consciousness: awake and alert   Airway and Oxygen Therapy: Patient Spontanous Breathing  Post-op Pain: mild  Post-op Assessment: Post-op Vital signs reviewed, Patient's Cardiovascular Status Stable, Respiratory Function Stable, Patent Airway and No signs of Nausea or vomiting  Post-op Vital Signs: stable  Complications: No apparent anesthesia complications

## 2011-09-26 NOTE — Progress Notes (Signed)
Reported to Gean Quint, Charity fundraiser as caregiver.  Transferred to phase 2.  Gait steady.  w minimal assist

## 2011-09-27 ENCOUNTER — Encounter (HOSPITAL_BASED_OUTPATIENT_CLINIC_OR_DEPARTMENT_OTHER): Payer: Self-pay | Admitting: Orthopedic Surgery

## 2011-11-12 ENCOUNTER — Ambulatory Visit: Payer: Medicare Other | Admitting: Neurology

## 2011-12-23 ENCOUNTER — Ambulatory Visit: Payer: Medicare Other | Attending: *Deleted | Admitting: Physical Therapy

## 2011-12-23 DIAGNOSIS — R293 Abnormal posture: Secondary | ICD-10-CM | POA: Insufficient documentation

## 2011-12-23 DIAGNOSIS — IMO0001 Reserved for inherently not codable concepts without codable children: Secondary | ICD-10-CM | POA: Insufficient documentation

## 2011-12-23 DIAGNOSIS — M542 Cervicalgia: Secondary | ICD-10-CM | POA: Insufficient documentation

## 2011-12-23 DIAGNOSIS — M25519 Pain in unspecified shoulder: Secondary | ICD-10-CM | POA: Insufficient documentation

## 2011-12-23 DIAGNOSIS — R5381 Other malaise: Secondary | ICD-10-CM | POA: Insufficient documentation

## 2011-12-25 ENCOUNTER — Ambulatory Visit: Payer: Medicare Other | Admitting: Physical Therapy

## 2011-12-30 ENCOUNTER — Ambulatory Visit: Payer: Medicare Other | Admitting: Physical Therapy

## 2012-01-01 ENCOUNTER — Ambulatory Visit: Payer: Medicare Other | Admitting: Physical Therapy

## 2012-01-02 ENCOUNTER — Encounter: Payer: Medicare Other | Admitting: Physical Therapy

## 2012-01-06 ENCOUNTER — Ambulatory Visit: Payer: Medicare Other | Admitting: Physical Therapy

## 2012-01-08 ENCOUNTER — Encounter: Payer: Self-pay | Admitting: Gastroenterology

## 2012-01-09 ENCOUNTER — Encounter: Payer: Medicare Other | Admitting: Physical Therapy

## 2012-01-13 ENCOUNTER — Ambulatory Visit: Payer: Medicare Other | Admitting: Physical Therapy

## 2012-01-15 ENCOUNTER — Encounter: Payer: Medicare Other | Admitting: Physical Therapy

## 2012-01-21 ENCOUNTER — Telehealth: Payer: Self-pay | Admitting: Gastroenterology

## 2012-01-21 NOTE — Telephone Encounter (Signed)
Spoke with patient and she has Hyomax from previous rx and will use if needed.

## 2012-01-21 NOTE — Telephone Encounter (Signed)
Spoke with patient and she states she has a "sore stomach" that is sensitive on the left side. States she has had this for several months and is seems to be getting worse. Also states she has had nausea for several months. Scheduled patient with Dr. Arlyce Dice on 02/03/12 at 2:15 PM.

## 2012-01-21 NOTE — Telephone Encounter (Signed)
Patient should take hyomax for pain until her office visit

## 2012-02-03 ENCOUNTER — Ambulatory Visit (INDEPENDENT_AMBULATORY_CARE_PROVIDER_SITE_OTHER): Payer: Medicare Other | Admitting: Gastroenterology

## 2012-02-03 ENCOUNTER — Encounter: Payer: Self-pay | Admitting: Gastroenterology

## 2012-02-03 VITALS — BP 122/80 | HR 76 | Ht 68.0 in | Wt 267.0 lb

## 2012-02-03 DIAGNOSIS — R1032 Left lower quadrant pain: Secondary | ICD-10-CM

## 2012-02-03 DIAGNOSIS — K3184 Gastroparesis: Secondary | ICD-10-CM

## 2012-02-03 MED ORDER — AMBULATORY NON FORMULARY MEDICATION: 10.0000 mg | Freq: Four times a day (QID) | Status: DC

## 2012-02-03 MED ORDER — HYOSCYAMINE SULFATE ER 0.375 MG PO TBCR: 1.0000 | EXTENDED_RELEASE_TABLET | Freq: Two times a day (BID) | ORAL | Status: DC | PRN

## 2012-02-03 NOTE — Assessment & Plan Note (Addendum)
I believe she is symptomatic again from gastroparesis. Patient is concerned about taking metoclopramide because of its side effects.  Recommendations #1 begin domperidone 10 mg one half hour a.c. and at bedtime

## 2012-02-03 NOTE — Assessment & Plan Note (Addendum)
She has nonspecific abdominal pain that has responded to hyomax. Recent endoscopy and colonoscopy were unrevealing for a source for pain.  Recommendations #1 renew hyomax

## 2012-02-03 NOTE — Progress Notes (Signed)
History of Present Illness:  Paula Steele has returned for evaluation of an abdominal knot and bloating. She is concerned about a knot she feels in her right midabdomen when she stands. She continues to complain of some bloating and mild nausea. Left lower quadrant pain has been relieved in the past with hyomax.  CT of the abdomen in August, 2012 did not demonstrate any abnormalities in the abdominal wall. Questionable nodularity of the liver was noted.   In 2005 marked gastroparesis was demonstrated by gastric emptying scan.    Review of Systems: Pertinent positive and negative review of systems were noted in the above HPI section. All other review of systems were otherwise negative.    Current Medications, Allergies, Past Medical History, Past Surgical History, Family History and Social History were reviewed in [employer] electronic medical record  Vital signs were reviewed in today's medical record. Physical Exam: General: Well developed , well nourished, no acute distress Head: Normocephalic and atraumatic Eyes:  sclerae anicteric, EOMI Ears: Normal auditory acuity Mouth: No deformity or lesions Lungs: Clear throughout to auscultation Heart: Regular rate and rhythm; no murmurs, rubs or bruits Abdomen: Soft, non tender and non distended. No masses, hepatosplenomegaly or hernias noted. Normal Bowel sounds. There is minimal laxity in the abdominal wall in the area that she is feels there is a knot. It is mildly tender. There is no frank hernia. No succussion splash. Rectal:deferred Musculoskeletal: Symmetrical with no gross deformities  Pulses:  Normal pulses noted Extremities: No clubbing, cyanosis, edema or deformities noted Neurological: Alert oriented x 4, grossly nonfocal Psychological:  Alert and cooperative. Normal mood and affect

## 2012-02-03 NOTE — Patient Instructions (Signed)
We are sending in a prescription for you called Domperidone 10mg , You will use this medication 4 times a day 30 minutes before meals and at bedtime We are faxing this prescription to [facility], a  [employer] number is [phone] Phone number is [phone]  You will need to follow up with Dr Arlyce Dice in 6 weeks after starting your medication

## 2012-02-04 ENCOUNTER — Other Ambulatory Visit: Payer: Self-pay | Admitting: Orthopedic Surgery

## 2012-02-04 MED ORDER — DEXAMETHASONE SODIUM PHOSPHATE 10 MG/ML IJ SOLN: 10.0000 mg | Freq: Once | INTRAMUSCULAR | Status: DC

## 2012-02-05 ENCOUNTER — Ambulatory Visit: Payer: Medicare Other | Admitting: Neurology

## 2012-02-27 ENCOUNTER — Other Ambulatory Visit (HOSPITAL_COMMUNITY): Payer: Self-pay | Admitting: Gastroenterology

## 2012-02-28 ENCOUNTER — Ambulatory Visit: Payer: Medicare Other | Admitting: Neurology

## 2012-03-02 ENCOUNTER — Other Ambulatory Visit (HOSPITAL_COMMUNITY): Payer: Medicare Other

## 2012-03-24 ENCOUNTER — Encounter (HOSPITAL_COMMUNITY): Payer: Self-pay

## 2012-03-24 ENCOUNTER — Encounter (HOSPITAL_COMMUNITY): Payer: Self-pay | Admitting: Pharmacy Technician

## 2012-03-24 ENCOUNTER — Encounter (HOSPITAL_COMMUNITY)
Admission: RE | Admit: 2012-03-24 | Discharge: 2012-03-24 | Disposition: A | Discharge status: Home / Self Care | Payer: Medicare Other | Source: Ambulatory Visit | Attending: Orthopedic Surgery | Admitting: Orthopedic Surgery

## 2012-03-24 HISTORY — DX: Pain in left arm: M79.602

## 2012-03-24 HISTORY — DX: Encounter for other specified aftercare: Z51.89

## 2012-03-24 HISTORY — DX: Derangement of unspecified medial meniscus due to old tear or injury, right knee: M23.200

## 2012-03-24 HISTORY — DX: Derangement of unspecified meniscus due to old tear or injury, right knee: M23.206

## 2012-03-24 HISTORY — DX: Derangement of unspecified lateral meniscus due to old tear or injury, right knee: M23.203

## 2012-03-24 HISTORY — DX: Urge incontinence: N39.41

## 2012-03-24 HISTORY — DX: Reserved for inherently not codable concepts without codable children: IMO0001

## 2012-03-24 LAB — BASIC METABOLIC PANEL
BUN: 12 mg/dL (ref 6–23)
CO2: 29 mEq/L (ref 19–32)
Calcium: 10.1 mg/dL (ref 8.4–10.5)
Chloride: 103 mEq/L (ref 96–112)
Creatinine, Ser: 0.65 mg/dL (ref 0.50–1.10)
GFR calc Af Amer: >90 mL/min (ref >90)
GFR calc non Af Amer: 90 mL/min — ABNORMAL LOW (ref >90)
Glucose, Bld: 271 mg/dL — ABNORMAL HIGH (ref 70–99)
Potassium: 5.4 mEq/L — ABNORMAL HIGH (ref 3.5–5.1)
Sodium: 140 mEq/L (ref 135–145)

## 2012-03-24 LAB — SURGICAL PCR SCREEN
MRSA, PCR: NEGATIVE
Staphylococcus aureus: NEGATIVE

## 2012-03-24 LAB — CBC
HCT: 44.1 % (ref 36.0–46.0)
Hemoglobin: 14.8 g/dL (ref 12.0–15.0)
MCH: 28.9 pg (ref 26.0–34.0)
MCHC: 33.6 g/dL (ref 30.0–36.0)
MCV: 86.1 fL (ref 78.0–100.0)
Platelets: 162 10*3/uL (ref 150–400)
RBC: 5.12 MIL/uL — ABNORMAL HIGH (ref 3.87–5.11)
RDW: 13.2 % (ref 11.5–15.5)
WBC: 6 10*3/uL (ref 4.0–10.5)

## 2012-03-24 MED ORDER — CHLORHEXIDINE GLUCONATE 4 % EX LIQD
60.0000 mL | Freq: Once | CUTANEOUS | Status: DC
  Filled 2012-03-24: qty 60

## 2012-03-24 NOTE — Pre-Procedure Instructions (Signed)
Spoke with Avel Peace about abnormal BMET

## 2012-03-24 NOTE — Patient Instructions (Addendum)
20 Paula Steele  03/24/2012   Your procedure is scheduled on:  03/25/12  Report to SHORT STAY DEPT  at 7:45 AM.  Call this number if you have problems the morning of surgery: [phone]   Remember:   Do not eat food or drink liquids AFTER MIDNIGHT    Take these medicines the morning of surgery with A SIP OF WATER: VALIUM / HYDROCODONE IF NEEDED / OMEPRAZOLE / CRESTOR  / TAKE ONLY 1/2 DOSE OF INSULIN THE NIGHT BEFORE SURGERY   Do not wear jewelry, make-up or nail polish.  Do not wear lotions, powders, or perfumes.   Do not shave legs or underarms 48 hrs. before surgery (men may shave face)  Do not bring valuables to the hospital.  Contacts, dentures or bridgework may not be worn into surgery.  Leave suitcase in the car. After surgery it may be brought to your room.  For patients admitted to the hospital, checkout time is 11:00 AM the day of discharge.   Patients discharged the day of surgery will not be allowed to drive home.    Special Instructions:   Please read over the following fact sheets that you were given: MRSA  Information / Incentive Spirometer               SHOWER WITH BETASEPT THE NIGHT BEFORE SURGERY AND THE MORNING OF SURGERY

## 2012-03-24 NOTE — Progress Notes (Signed)
03/24/12 1200  OBSTRUCTIVE SLEEP APNEA  Have you ever been diagnosed with sleep apnea through a sleep study? No  Do you snore loudly (loud enough to be heard through closed doors)?  0  Do you often feel tired, fatigued, or sleepy during the daytime? 1  Has anyone observed you stop breathing during your sleep? 0  Do you have, or are you being treated for high blood pressure? 0  BMI more than 35 kg/m2? 1  Age over 67 years old? 1  Neck circumference greater than 40 cm/18 inches? 1  Gender: 0  Obstructive Sleep Apnea Score 4   Score 4 or greater  Updated health history;Results sent to PCP

## 2012-03-24 NOTE — Pre-Procedure Instructions (Signed)
BMET faxed to Dr. Aluisio - confirmation recieved 

## 2012-03-25 ENCOUNTER — Encounter (HOSPITAL_COMMUNITY): Payer: Self-pay

## 2012-03-25 ENCOUNTER — Encounter (HOSPITAL_COMMUNITY): Payer: Self-pay | Admitting: Anesthesiology

## 2012-03-25 ENCOUNTER — Encounter (HOSPITAL_COMMUNITY)
Admission: RE | Disposition: A | Discharge status: Home / Self Care | Payer: Self-pay | Source: Ambulatory Visit | Attending: Orthopedic Surgery

## 2012-03-25 ENCOUNTER — Ambulatory Visit (HOSPITAL_COMMUNITY): Payer: Medicare Other | Admitting: Anesthesiology

## 2012-03-25 ENCOUNTER — Ambulatory Visit (HOSPITAL_COMMUNITY)
Admission: RE | Admit: 2012-03-25 | Discharge: 2012-03-25 | Disposition: A | Discharge status: Home / Self Care | Payer: Medicare Other | Source: Ambulatory Visit | Attending: Orthopedic Surgery | Admitting: Orthopedic Surgery

## 2012-03-25 DIAGNOSIS — Z79899 Other long term (current) drug therapy: Secondary | ICD-10-CM | POA: Insufficient documentation

## 2012-03-25 DIAGNOSIS — IMO0002 Reserved for concepts with insufficient information to code with codable children: Secondary | ICD-10-CM | POA: Insufficient documentation

## 2012-03-25 DIAGNOSIS — S83249A Other tear of medial meniscus, current injury, unspecified knee, initial encounter: Secondary | ICD-10-CM | POA: Diagnosis present

## 2012-03-25 DIAGNOSIS — K219 Gastro-esophageal reflux disease without esophagitis: Secondary | ICD-10-CM | POA: Insufficient documentation

## 2012-03-25 DIAGNOSIS — Z794 Long term (current) use of insulin: Secondary | ICD-10-CM | POA: Insufficient documentation

## 2012-03-25 DIAGNOSIS — S83289A Other tear of lateral meniscus, current injury, unspecified knee, initial encounter: Secondary | ICD-10-CM | POA: Insufficient documentation

## 2012-03-25 DIAGNOSIS — Z01812 Encounter for preprocedural laboratory examination: Secondary | ICD-10-CM | POA: Insufficient documentation

## 2012-03-25 DIAGNOSIS — X58XXXA Exposure to other specified factors, initial encounter: Secondary | ICD-10-CM | POA: Insufficient documentation

## 2012-03-25 DIAGNOSIS — E119 Type 2 diabetes mellitus without complications: Secondary | ICD-10-CM | POA: Insufficient documentation

## 2012-03-25 DIAGNOSIS — E785 Hyperlipidemia, unspecified: Secondary | ICD-10-CM | POA: Insufficient documentation

## 2012-03-25 HISTORY — DX: Personal history of urinary (tract) infections: Z87.440

## 2012-03-25 HISTORY — PX: KNEE ARTHROSCOPY: SHX127

## 2012-03-25 LAB — GLUCOSE, CAPILLARY
Glucose-Capillary: 370 mg/dL — ABNORMAL HIGH (ref 70–99)
Glucose-Capillary: 359 mg/dL — ABNORMAL HIGH (ref 70–99)
Glucose-Capillary: 305 mg/dL — ABNORMAL HIGH (ref 70–99)
Glucose-Capillary: 260 mg/dL — ABNORMAL HIGH (ref 70–99)
Glucose-Capillary: 181 mg/dL — ABNORMAL HIGH (ref 70–99)
Glucose-Capillary: 258 mg/dL — ABNORMAL HIGH (ref 70–99)

## 2012-03-25 SURGERY — ARTHROSCOPY, KNEE
Anesthesia: General | Site: Knee | Laterality: Right | Wound class: Clean

## 2012-03-25 MED ORDER — OXYCODONE HCL 5 MG PO TABS: 5.0000 mg | ORAL_TABLET | ORAL | Status: AC | PRN

## 2012-03-25 MED ORDER — BUPIVACAINE-EPINEPHRINE 0.25% -1:200000 IJ SOLN
INTRAMUSCULAR | Status: DC | PRN
  Administered 2012-03-25: 20 mL

## 2012-03-25 MED ORDER — ACETAMINOPHEN 10 MG/ML IV SOLN
1000.0000 mg | Freq: Once | INTRAVENOUS | Status: AC
  Administered 2012-03-25: 1000 mg via INTRAVENOUS
  Filled 2012-03-25: qty 100

## 2012-03-25 MED ORDER — PROPOFOL 10 MG/ML IV EMUL
INTRAVENOUS | Status: DC | PRN
  Administered 2012-03-25: 250 mg via INTRAVENOUS

## 2012-03-25 MED ORDER — FENTANYL CITRATE 0.05 MG/ML IJ SOLN
INTRAMUSCULAR | Status: AC
  Filled 2012-03-25: qty 2

## 2012-03-25 MED ORDER — OXYCODONE HCL 5 MG PO TABS
ORAL_TABLET | ORAL | Status: AC
  Administered 2012-03-25: 5 mg
  Filled 2012-03-25: qty 1

## 2012-03-25 MED ORDER — ACETAMINOPHEN 10 MG/ML IV SOLN
INTRAVENOUS | Status: AC
  Filled 2012-03-25: qty 100

## 2012-03-25 MED ORDER — SODIUM CHLORIDE 0.9 % IV SOLN: INTRAVENOUS | Status: DC

## 2012-03-25 MED ORDER — BUPIVACAINE-EPINEPHRINE PF 0.25-1:200000 % IJ SOLN
INTRAMUSCULAR | Status: AC
  Filled 2012-03-25: qty 30

## 2012-03-25 MED ORDER — LACTATED RINGERS IV SOLN: INTRAVENOUS | Status: DC

## 2012-03-25 MED ORDER — INSULIN ASPART 100 UNIT/ML ~~LOC~~ SOLN
SUBCUTANEOUS | Status: AC
  Administered 2012-03-25: 15 [IU] via SUBCUTANEOUS
  Filled 2012-03-25: qty 1

## 2012-03-25 MED ORDER — CEFAZOLIN SODIUM-DEXTROSE 2-3 GM-% IV SOLR
2.0000 g | INTRAVENOUS | Status: AC
  Administered 2012-03-25: 2 g via INTRAVENOUS

## 2012-03-25 MED ORDER — LACTATED RINGERS IV SOLN
INTRAVENOUS | Status: DC
  Administered 2012-03-25: 1000 mL via INTRAVENOUS

## 2012-03-25 MED ORDER — ONDANSETRON HCL 4 MG/2ML IJ SOLN
INTRAMUSCULAR | Status: DC | PRN
  Administered 2012-03-25: 4 mg via INTRAVENOUS

## 2012-03-25 MED ORDER — FENTANYL CITRATE 0.05 MG/ML IJ SOLN
25.0000 ug | INTRAMUSCULAR | Status: AC | PRN
  Administered 2012-03-25: 25 ug via INTRAVENOUS
  Administered 2012-03-25: 50 ug via INTRAVENOUS
  Administered 2012-03-25 (×3): 25 ug via INTRAVENOUS
  Administered 2012-03-25: 50 ug via INTRAVENOUS

## 2012-03-25 MED ORDER — FENTANYL CITRATE 0.05 MG/ML IJ SOLN
INTRAMUSCULAR | Status: DC | PRN
  Administered 2012-03-25: 25 ug via INTRAVENOUS
  Administered 2012-03-25 (×2): 50 ug via INTRAVENOUS

## 2012-03-25 MED ORDER — LACTATED RINGERS IR SOLN
Status: DC | PRN
  Administered 2012-03-25 (×4): 3000 mL

## 2012-03-25 MED ORDER — METHOCARBAMOL 500 MG PO TABS
ORAL_TABLET | ORAL | Status: AC
  Administered 2012-03-25: 500 mg
  Filled 2012-03-25: qty 1

## 2012-03-25 MED ORDER — CEFAZOLIN SODIUM 1-5 GM-% IV SOLN
INTRAVENOUS | Status: AC
  Filled 2012-03-25: qty 100

## 2012-03-25 MED ORDER — LIDOCAINE HCL (CARDIAC) 20 MG/ML IV SOLN
INTRAVENOUS | Status: DC | PRN
  Administered 2012-03-25: 80 mg via INTRAVENOUS

## 2012-03-25 MED ORDER — INSULIN ASPART 100 UNIT/ML ~~LOC~~ SOLN
0.0000 [IU] | SUBCUTANEOUS | Status: DC
  Administered 2012-03-25: 15 [IU] via SUBCUTANEOUS

## 2012-03-25 MED ORDER — METHOCARBAMOL 500 MG PO TABS: 500.0000 mg | ORAL_TABLET | Freq: Four times a day (QID) | ORAL | Status: AC

## 2012-03-25 MED ORDER — MIDAZOLAM HCL 5 MG/5ML IJ SOLN
INTRAMUSCULAR | Status: DC | PRN
  Administered 2012-03-25: 1 mg via INTRAVENOUS

## 2012-03-25 MED ORDER — LIDOCAINE HCL 1 % IJ SOLN
INTRAMUSCULAR | Status: AC
  Filled 2012-03-25: qty 20

## 2012-03-25 SURGICAL SUPPLY — 27 items
BANDAGE ELASTIC 6 VELCRO ST LF (GAUZE/BANDAGES/DRESSINGS) ×2 IMPLANT
BLADE 4.2CUDA (BLADE) ×2 IMPLANT
BLADE CUDA SHAVER 3.5 (BLADE) ×2 IMPLANT
CLOTH BEACON ORANGE TIMEOUT ST (SAFETY) ×2 IMPLANT
CUFF TOURN SGL QUICK 34 (TOURNIQUET CUFF) ×1
CUFF TRNQT CYL 34X4X40X1 (TOURNIQUET CUFF) ×1 IMPLANT
DRAPE U-SHAPE 47X51 STRL (DRAPES) ×2 IMPLANT
DRSG EMULSION OIL 3X3 NADH (GAUZE/BANDAGES/DRESSINGS) ×2 IMPLANT
DRSG PAD ABDOMINAL 8X10 ST (GAUZE/BANDAGES/DRESSINGS) ×2 IMPLANT
DURAPREP 26ML APPLICATOR (WOUND CARE) ×2 IMPLANT
GLOVE BIO SURGEON STRL SZ7.5 (GLOVE) ×2 IMPLANT
GLOVE BIO SURGEON STRL SZ8 (GLOVE) ×2 IMPLANT
GLOVE BIOGEL PI IND STRL 8 (GLOVE) ×1 IMPLANT
GLOVE BIOGEL PI INDICATOR 8 (GLOVE) ×1
GOWN STRL NON-REIN LRG LVL3 (GOWN DISPOSABLE) ×2 IMPLANT
MANIFOLD NEPTUNE II (INSTRUMENTS) ×2 IMPLANT
PACK ARTHROSCOPY WL (CUSTOM PROCEDURE TRAY) ×2 IMPLANT
PACK ICE MAXI GEL EZY WRAP (MISCELLANEOUS) ×2 IMPLANT
PADDING CAST COTTON 6X4 STRL (CAST SUPPLIES) ×2 IMPLANT
POSITIONER SURGICAL ARM (MISCELLANEOUS) ×2 IMPLANT
SET ARTHROSCOPY TUBING (MISCELLANEOUS) ×1
SET ARTHROSCOPY TUBING LN (MISCELLANEOUS) ×1 IMPLANT
SPONGE GAUZE 4X4 12PLY (GAUZE/BANDAGES/DRESSINGS) ×2 IMPLANT
SUT ETHILON 4 0 PS 2 18 (SUTURE) ×2 IMPLANT
TOWEL OR 17X26 10 PK STRL BLUE (TOWEL DISPOSABLE) ×2 IMPLANT
WAND 90 DEG TURBOVAC W/CORD (SURGICAL WAND) ×2 IMPLANT
WRAP KNEE MAXI GEL POST OP (GAUZE/BANDAGES/DRESSINGS) ×4 IMPLANT

## 2012-03-25 NOTE — Progress Notes (Signed)
Patient received via stretcher to wait for CBG to be below 300.

## 2012-03-25 NOTE — Op Note (Signed)
Preoperative diagnosis-  Right knee medial meniscal tear  Postoperative diagnosis Right- knee medial meniscal tear   Plus Right lateral meniscal tear  Procedure- Right knee arthroscopy with medial and lateral  Meniscal debridement   Surgeon- Gus Rankin. Maude Gloor, MD  Anesthesia-General  EBL-  minimal Complications- None  Condition- PACU - hemodynamically stable.  Brief clinical note- -Paula Steele is a 66 y.o.  female with a several month history of right knee pain and mechanical symptoms. Exam and history suggested recurrent medial meniscal tear confirmed by MRI. The patient presents now for arthroscopy and debridement  Procedure in detail -       After successful administration of General anesthetic, a tourmiquet is placed high on the Right  thigh and the Right lower extremity is prepped and draped in the usual sterile fashion. Time out is performed by the surgical team. Standard superomedial and inferolateral portal sites are marked and incisions made with an 11 blade. The inflow cannula is passed through the superomedial portal and camera through the inferolateral portal and inflow is initiated. Arthroscopic visualization proceeds.      The undersurface of the patella and trochlea are visualized and there is grade II and II chondromalacia patella with grade I changes in the trochlea. There are no unstable cartilage lesions. The medial and lateral gutters are visualized and there are  no loose bodies. Flexion and valgus force is applied to the knee and the medial compartment is entered. A spinal needle is passed into the joint through the site marked for the inferomedial portal. A small incision is made and the dilator passed into the joint. The findings for the medial compartment are medial meniscal tear and grade II changes medial femoral condyle without anu unstable chondral lesions. . The tear is debrided to a stable base with baskets and a shaver and sealed off with the Arthrocare.   The intercondylar notch is visualized and the ACL appears normal. The lateral compartment is entered and the findings are tear of the body of the lateral meniscus extending into the posterior horn. The chondral surfaces are normal . The tear is debrided to a stable base with baskets and a shaver and sealed off with the Arthrocare.      The joint is again inspected and there are no other tears, defects or loose bodies identified. The arthroscopic equipment is then removed from the inferior portals which are closed with interrupted 4-0 nylon. 20 ml of .25% Marcaine with epinephrine are injected through the inflow cannula and the cannula is then removed and the portal closed with nylon. The incisions are cleaned and dried and a bulky sterile dressing is applied. The patient is then awakened and transported to recovery in stable condition.   03/25/2012, 1:25 PM

## 2012-03-25 NOTE — Progress Notes (Signed)
DC instructions given with " teach back " done

## 2012-03-25 NOTE — Preoperative (Signed)
Beta Blockers   Reason not to administer Beta Blockers:Not Applicable 

## 2012-03-25 NOTE — Progress Notes (Signed)
Patient assisted up to BR.

## 2012-03-25 NOTE — Anesthesia Postprocedure Evaluation (Signed)
  Anesthesia Post-op Note  Patient: Paula Steele  Procedure(s) Performed: Procedure(s) (LRB): ARTHROSCOPY KNEE (Right)  Patient Location: PACU  Anesthesia Type: General  Level of Consciousness: awake and alert   Airway and Oxygen Therapy: Patient Spontanous Breathing  Post-op Pain: mild  Post-op Assessment: Post-op Vital signs reviewed, Patient's Cardiovascular Status Stable, Respiratory Function Stable, Patent Airway and No signs of Nausea or vomiting  Post-op Vital Signs: stable  Complications: No apparent anesthesia complications. Denies complaints.

## 2012-03-25 NOTE — Interval H&P Note (Signed)
History and Physical Interval Note:  03/25/2012 12:23 PM  Paula Steele  has presented today for surgery, with the diagnosis of Right Knee Menical Tear  The various methods of treatment have been discussed with the patient and family. After consideration of risks, benefits and other options for treatment, the patient has consented to  Procedure(s) (LRB): ARTHROSCOPY KNEE (Right) as a surgical intervention .  The patients' history has been reviewed, patient examined, no change in status, stable for surgery.  I have reviewed the patients' chart and labs.  Questions were answered to the patient's satisfaction.     Loanne Drilling

## 2012-03-25 NOTE — Anesthesia Preprocedure Evaluation (Addendum)
Anesthesia Evaluation    Airway       Dental   Pulmonary shortness of breath and with exertion, sleep apnea ,  Stop bang 4         Cardiovascular + CAD     Neuro/Psych Depression    GI/Hepatic GERD-  Medicated and Controlled,(+) Cirrhosis -       , Non alcoholic cirrhosis   Endo/Other  Diabetes mellitus-, Poorly Controlled, Type 2, Insulin DependentPoorly controlled.  BS 370 this am.  Trying to get it below 300 with insulin pre op.  Renal/GU      Musculoskeletal   Abdominal   Peds  Hematology   Anesthesia Other Findings   Reproductive/Obstetrics                         Anesthesia Physical Anesthesia Plan  ASA: III  Anesthesia Plan: General   Post-op Pain Management:    Induction: Intravenous  Airway Management Planned: LMA  Additional Equipment:   Intra-op Plan:   Post-operative Plan:   Informed Consent:   Plan Discussed with: Surgeon  Anesthesia Plan Comments: (Discussed r/b general versus spinal. Had spinal with last arthroscopy. Desires general today.)       Anesthesia Quick Evaluation

## 2012-03-25 NOTE — Progress Notes (Addendum)
Patient CBG was elevated to 359 upon arrival to OR.  Patient had been given insulin in short stay area for blood sugar of 369.  Surgery on hold per Dr Leta Jungling MD until blood sugar lowered. CBG to be rechecked in 45 mins.  Patient requested to return to short stay until CBG repeated. Patient returned to room 30 in short stay area. Husband has been updated per patients request.

## 2012-03-25 NOTE — H&P (Signed)
CC- Paula Steele is a 66 y.o. female who presents with right knee pain.  HPI- . Knee Pain: Patient presents with knee pain involving the  right knee. Onset of the symptoms was several months ago. Inciting event: [place]. Had a knee arthroscopy with menisclal debridement but then had recurrent pain with MRI demonstrating recurrent meniscal tear. Presents now for repeat arthroscopy . Current symptoms include giving out, locking, pain located medially, stiffness and swelling  Past Medical History  Diagnosis Date  . Diverticulosis of colon (without mention of hemorrhage)   . Personal history of other diseases of digestive system     OCCASIONAL ABD. PAIN  . Other chronic nonalcoholic liver disease     SEES DR Arlyce Dice-- ?SCARRING OR CIRRHOSIS PER PET SCAN 2010  . Benign neoplasm of adrenal gland   . Benign neoplasm of colon   . Colon polyp   . Diabetes mellitus without mention of complication     INSULIN AND ORAL MEDS  . 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 Hyperlipidemia   . Vitamin d deficiency   . Neck pain, chronic S/P RADIAL NECK DISSECTION--  2007    LEFT SIDE OF NECK AND SHOULDER PAIN  . Insomnia DUE TO STRESS  . Shortness of breath on exertion   . Esophageal reflux     CONTROLLED W/ PRILOSEC  . Oropharyngeal dysphagia MILD RIGHT SUPRAGLOTTIC REGION    PT STATES SHE EATS VERY SLOWLY AND CHEWS FOOD WELL  . Weakness of both legs   . Degeneration of intervertebral disc, site unspecified     LUMBAR  . Arthritis      SHOULDERS--   IS CURRENTLY HAS LEFT SHOULDER PAIN  . 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 Left arm pain     and  heaviness since radical neck surg  . Chronic meniscal tear of knee, right   . Urgency incontinence   . Blood transfusion   . Sleep apnea     STOP BANG SCORE 4  . Hx: UTI (urinary tract infection)       Past Surgical History  Procedure Date  . Shoulder surgery 1990    LEFT  . Laparoscopic salpingoopherectomy 2001    RIGHT  W/ LYSIS AHESIONS  . Abdominal hysterectomy 1984    W/  LSO  . Neck mass excision 2007    LEFT  . Radical neck dissection 2007    W/ LEFT TONSILLECTOMY AND LEFT EXCISION LEFT TONGUE BASE DUE TO CANCER  . Direct laryngoscopy 2007    W/ LEFT NECK MASS EXCISION  . Tonsillectomy 01-31-2009    RIGHT (DUE TO MASS)  . Tonsillectomy 2007    LEFT W/ NECK DISSECTION DUE TO CANCER  . Cholecystectomy 15 YRS AGO  . Excision left sebacous cyst 05-14-2011 W/  LOCAL  . Cataract extraction w/ intraocular lens  implant, bilateral   . Knee arthroscopy 09/26/2011    Procedure: ARTHROSCOPY KNEE;  Surgeon: Loanne Drilling;  Location: [facility];  Service: Orthopedics;  Laterality: Right;  right knee arthroscopy with meniscal tear    Prior to Admission medications   Medication Sig Start Date End Date Taking? Authorizing Provider  diazepam (VALIUM) 10 MG tablet Take 10 mg by mouth every 12 (twelve) hours as needed. Muscle spasms   Yes Historical  Provider, MD  fexofenadine (ALLEGRA) 180 MG tablet Take 180 mg by mouth daily.   Yes Historical Provider, MD  gabapentin (NEURONTIN) 100 MG capsule Take 200 mg by mouth See admin instructions. She is supposed to work up to 300 mg at bedtime according to the bottle but she said she is going to talk to the Dr about it   Yes Historical Provider, MD  glimepiride (AMARYL) 4 MG tablet Take 4 mg by mouth 2 (two) times daily.    Yes Historical Provider, MD  HYDROcodone-acetaminophen (NORCO) 10-325 MG per tablet Take 1 tablet by mouth 4 (four) times daily as needed. pain   Yes Historical Provider, MD  Hyoscyamine Sulfate 0.375 MG TBCR Take 1 tablet (0.375 mg total) by mouth 2 (two) times daily as needed. Take one tab twice a day for abdominal pain for 3-4 days then as needed 02/03/12  Yes Louis Meckel, MD  ibuprofen (ADVIL,MOTRIN)  800 MG tablet Take 800 mg by mouth every 8 (eight) hours as needed. pain   Yes Historical Provider, MD  insulin glargine (LANTUS) 100 UNIT/ML injection Inject 60 Units into the skin 2 (two) times daily. Sliding scale in the mornings   Yes Historical Provider, MD  NOVOLOG FLEXPEN 100 UNIT/ML injection Inject 12-38 Units into the skin See admin instructions. PER Sliding scale 06/25/11  Yes Historical Provider, MD  omeprazole (PRILOSEC) 20 MG capsule Take 20 mg by mouth every morning.    Yes Historical Provider, MD  promethazine (PHENERGAN) 25 MG tablet Take 25-50 mg by mouth 2 (two) times daily as needed. nausea 06/24/11  Yes Historical Provider, MD  rosuvastatin (CRESTOR) 5 MG tablet Take 5 mg by mouth daily.    Yes Historical Provider, MD  tiZANidine (ZANAFLEX) 4 MG tablet 4 mg every 6 (six) hours as needed.  06/24/11  Yes Historical Provider, MD  triamcinolone (KENALOG) 0.1 % cream Apply 1 application topically as needed. itching 05/25/11  Yes Historical Provider, MD  Vitamin D, Ergocalciferol, (DRISDOL) 50000 UNITS CAPS Take 50,000 Units by mouth every 7 (seven) days. On sunday   Yes Historical Provider, MD   KNEE EXAM antalgic gait, soft tissue tenderness over medial joint line, effusion, reduced range of motion, negative drawer sign, collateral ligaments intact, normal ipsilateral hip exam  Physical Examination: General appearance - alert, well appearing, and in no distress Mental status - alert, oriented to person, place, and time Chest - clear to auscultation, no wheezes, rales or rhonchi, symmetric air entry Heart - normal rate, regular rhythm, normal S1, S2, no murmurs, rubs, clicks or gallops Abdomen - soft, nontender, nondistended, no masses or organomegaly Neurological - alert, oriented, normal speech, no focal findings or movement disorder noted   Asessment/Plan--- Right knee medial meniscal tear- - Plan right knee arthroscopy with meniscal debridement. Procedure risks and potential comps  discussed with patient who elects to proceed. Goals are decreased pain and increased function with a high likelihood of achieving both

## 2012-03-25 NOTE — Transfer of Care (Signed)
Immediate Anesthesia Transfer of Care Note  Patient: Paula Steele  Procedure(s) Performed: Procedure(s) (LRB): ARTHROSCOPY KNEE (Right)  Patient Location: PACU  Anesthesia Type: General  Level of Consciousness: awake, alert , oriented and patient cooperative  Airway & Oxygen Therapy: Patient Spontanous Breathing and Patient connected to face mask oxygen  Post-op Assessment: Report given to PACU RN, Post -op Vital signs reviewed and stable and Patient moving all extremities  Post vital signs: Reviewed and stable  Complications: No apparent anesthesia complications

## 2012-03-26 ENCOUNTER — Encounter (HOSPITAL_COMMUNITY): Payer: Self-pay | Admitting: Orthopedic Surgery

## 2012-03-31 ENCOUNTER — Inpatient Hospital Stay (HOSPITAL_COMMUNITY): Admission: RE | Admit: 2012-03-31 | Payer: Medicare Other | Source: Ambulatory Visit

## 2012-05-15 ENCOUNTER — Other Ambulatory Visit: Payer: Self-pay | Admitting: Otolaryngology

## 2012-05-15 DIAGNOSIS — R1310 Dysphagia, unspecified: Secondary | ICD-10-CM

## 2012-05-18 ENCOUNTER — Ambulatory Visit
Admission: RE | Admit: 2012-05-18 | Discharge: 2012-05-18 | Disposition: A | Discharge status: Home / Self Care | Payer: Medicare Other | Source: Ambulatory Visit | Attending: Otolaryngology | Admitting: Otolaryngology

## 2012-05-18 DIAGNOSIS — R1310 Dysphagia, unspecified: Secondary | ICD-10-CM

## 2012-05-19 ENCOUNTER — Other Ambulatory Visit: Payer: Self-pay | Admitting: Physician Assistant

## 2012-05-19 DIAGNOSIS — D3500 Benign neoplasm of unspecified adrenal gland: Secondary | ICD-10-CM

## 2012-05-19 DIAGNOSIS — R1032 Left lower quadrant pain: Secondary | ICD-10-CM

## 2012-05-25 ENCOUNTER — Other Ambulatory Visit: Payer: Medicare Other

## 2012-05-27 ENCOUNTER — Ambulatory Visit
Admission: RE | Admit: 2012-05-27 | Discharge: 2012-05-27 | Disposition: A | Discharge status: Home / Self Care | Payer: Medicare Other | Source: Ambulatory Visit | Attending: Physician Assistant | Admitting: Physician Assistant

## 2012-05-27 ENCOUNTER — Inpatient Hospital Stay
Admission: RE | Admit: 2012-05-27 | Discharge: 2012-05-27 | Payer: Medicare Other | Source: Ambulatory Visit | Attending: Physician Assistant | Admitting: Physician Assistant

## 2012-05-27 DIAGNOSIS — D3500 Benign neoplasm of unspecified adrenal gland: Secondary | ICD-10-CM

## 2012-05-27 DIAGNOSIS — R1032 Left lower quadrant pain: Secondary | ICD-10-CM

## 2012-10-23 ENCOUNTER — Other Ambulatory Visit: Payer: Self-pay | Admitting: Orthopedic Surgery

## 2012-10-23 MED ORDER — BUPIVACAINE LIPOSOME 1.3 % IJ SUSP: 20.0000 mL | Freq: Once | INTRAMUSCULAR | Status: DC

## 2012-10-23 MED ORDER — DEXAMETHASONE SODIUM PHOSPHATE 10 MG/ML IJ SOLN: 10.0000 mg | Freq: Once | INTRAMUSCULAR | Status: DC

## 2012-10-23 NOTE — Progress Notes (Signed)
Preoperative surgical orders have been place into the Epic hospital system for Paula Steele on 10/23/2012, 7:59 AM  by Patrica Duel for surgery on 11/30/2012.  Preop Total Knee orders including Experal, IV Tylenol, and IV Decadron as long as there are no contraindications to the above medications. Avel Peace, PA-C

## 2012-11-20 ENCOUNTER — Encounter (HOSPITAL_COMMUNITY): Payer: Self-pay | Admitting: Pharmacy Technician

## 2012-11-24 ENCOUNTER — Ambulatory Visit (HOSPITAL_COMMUNITY)
Admission: RE | Admit: 2012-11-24 | Discharge: 2012-11-24 | Disposition: A | Discharge status: Home / Self Care | Payer: Medicare Other | Source: Ambulatory Visit | Attending: Orthopedic Surgery | Admitting: Orthopedic Surgery

## 2012-11-24 ENCOUNTER — Encounter (HOSPITAL_COMMUNITY): Payer: Self-pay

## 2012-11-24 ENCOUNTER — Encounter (HOSPITAL_COMMUNITY)
Admission: RE | Admit: 2012-11-24 | Discharge: 2012-11-24 | Disposition: A | Discharge status: Home / Self Care | Payer: Medicare Other | Source: Ambulatory Visit | Attending: Orthopedic Surgery | Admitting: Orthopedic Surgery

## 2012-11-24 DIAGNOSIS — Z01812 Encounter for preprocedural laboratory examination: Secondary | ICD-10-CM | POA: Insufficient documentation

## 2012-11-24 DIAGNOSIS — R9431 Abnormal electrocardiogram [ECG] [EKG]: Secondary | ICD-10-CM | POA: Insufficient documentation

## 2012-11-24 DIAGNOSIS — Z01810 Encounter for preprocedural cardiovascular examination: Secondary | ICD-10-CM | POA: Insufficient documentation

## 2012-11-24 DIAGNOSIS — Z01818 Encounter for other preprocedural examination: Secondary | ICD-10-CM | POA: Insufficient documentation

## 2012-11-24 DIAGNOSIS — I517 Cardiomegaly: Secondary | ICD-10-CM | POA: Insufficient documentation

## 2012-11-24 HISTORY — DX: Bronchitis, not specified as acute or chronic: J40

## 2012-11-24 HISTORY — DX: Chronic kidney disease, unspecified: N18.9

## 2012-11-24 LAB — PROTIME-INR
INR: 1.02 (ref 0.00–1.49)
Prothrombin Time: 13.3 seconds (ref 11.6–15.2)

## 2012-11-24 LAB — COMPREHENSIVE METABOLIC PANEL
ALT: 64 U/L — ABNORMAL HIGH (ref 0–35)
AST: 67 U/L — ABNORMAL HIGH (ref 0–37)
Albumin: 3.4 g/dL — ABNORMAL LOW (ref 3.5–5.2)
Alkaline Phosphatase: 118 U/L — ABNORMAL HIGH (ref 39–117)
BUN: 13 mg/dL (ref 6–23)
CO2: 25 mEq/L (ref 19–32)
Calcium: 9.4 mg/dL (ref 8.4–10.5)
Chloride: 99 mEq/L (ref 96–112)
Creatinine, Ser: 0.66 mg/dL (ref 0.50–1.10)
GFR calc Af Amer: >90 mL/min (ref >90)
GFR calc non Af Amer: 89 mL/min — ABNORMAL LOW (ref >90)
Glucose, Bld: 284 mg/dL — ABNORMAL HIGH (ref 70–99)
Potassium: 3.9 mEq/L (ref 3.5–5.1)
Sodium: 136 mEq/L (ref 135–145)
Total Bilirubin: 0.5 mg/dL (ref 0.3–1.2)
Total Protein: 8 g/dL (ref 6.0–8.3)

## 2012-11-24 LAB — CBC
HCT: 43.9 % (ref 36.0–46.0)
Hemoglobin: 14.9 g/dL (ref 12.0–15.0)
MCH: 28.5 pg (ref 26.0–34.0)
MCHC: 33.9 g/dL (ref 30.0–36.0)
MCV: 84.1 fL (ref 78.0–100.0)
Platelets: 139 10*3/uL — ABNORMAL LOW (ref 150–400)
RBC: 5.22 MIL/uL — ABNORMAL HIGH (ref 3.87–5.11)
RDW: 13.4 % (ref 11.5–15.5)
WBC: 6.1 10*3/uL (ref 4.0–10.5)

## 2012-11-24 LAB — URINALYSIS, ROUTINE W REFLEX MICROSCOPIC
Bilirubin Urine: NEGATIVE
Glucose, UA: >1000 mg/dL — AB
Hgb urine dipstick: NEGATIVE
Ketones, ur: NEGATIVE mg/dL
Nitrite: NEGATIVE
Protein, ur: NEGATIVE mg/dL
Specific Gravity, Urine: 1.031 — ABNORMAL HIGH (ref 1.005–1.030)
Urobilinogen, UA: 0.2 mg/dL (ref 0.0–1.0)
pH: 5.5 (ref 5.0–8.0)

## 2012-11-24 LAB — URINE MICROSCOPIC-ADD ON

## 2012-11-24 LAB — APTT: aPTT: 32 seconds (ref 24–37)

## 2012-11-24 LAB — SURGICAL PCR SCREEN
MRSA, PCR: NEGATIVE
Staphylococcus aureus: NEGATIVE

## 2012-11-24 NOTE — Progress Notes (Signed)
Sent by [facility] fax results of urinalysis with micro results, and CMET results.

## 2012-11-24 NOTE — Patient Instructions (Signed)
Paula Steele  11/24/2012   Your procedure is scheduled on:  11/30/12   Report to [facility] at  0840  AM.  Call this number if you have problems the morning of surgery: [phone]   Remember:   Do not eat food or drink liquids after midnight.   Take these medicines the morning of surgery with A SIP OF WATER:    Do not wear jewelry, make-up or nail polish.  Do not wear lotions, powders, or perfumes.   Do not shave 48 hours prior to surgery. .  Do not bring valuables to the hospital.  Contacts, dentures or bridgework may not be worn into surgery.  Leave suitcase in the car. After surgery it may be brought to your room.  For patients admitted to the hospital, checkout time is 11:00 AM the day of  discharge.       SEE CHG INSTRUCTION SHEET    Please read over the following fact sheets that you were given: MRSA Information, coughing and deep breathing exercises, leg exercises, Incentive Spirometry Fact sheet, Blood Transfusion Fact sheet                Failure to comply with these instructions may result in cancellation of your surgery.                Patient Signature ____________________________              Nurse Signature _____________________________

## 2012-11-26 LAB — URINE CULTURE: Colony Count: >=100000

## 2012-11-29 ENCOUNTER — Other Ambulatory Visit: Payer: Self-pay | Admitting: Orthopedic Surgery

## 2012-11-29 NOTE — H&P (Signed)
Paula Steele  DOB: [birth year] Married / Language: English / Race: White Female  Date of Admission:  11/30/2012  Chief Complaint:  Right Knee Pain  History of Present Illness The patient is a 67 year old female who comes in for a preoperative History and Physical. The patient is scheduled for a right total knee arthroplasty to be performed by Dr. Gus Rankin. Aluisio, MD at [facility] on 11/30/2012. The patient is a 67 year old female presenting for a post-operative visit. The patient comes in today 10 months (1/2) out from right knee arthroscopy. The patient states that she is doing poorly at this time. They currently describe their pain as moderate to severe.The patient states that the swelling is mild at this time. Note for "Post Knee Arthroscopy": She just finished a Synvisc series. After the third Synvisc injection she felt that the knee got much worse. She has had progressively worsening pain and dysfunction. It hurts her at all times. It is very swollen. She feels as though it is significantly limiting what she can and can not do. She is ready to proceed with knee replacement surgery. They have been treated conservatively in the past for the above stated problem and despite conservative measures, they continue to have progressive pain and severe functional limitations and dysfunction. They have failed non-operative management including home exercise, medications, and injections. It is felt that they would benefit from undergoing total joint replacement. Risks and benefits of the procedure have been discussed with the patient and they elect to proceed with surgery. There are no active contraindications to surgery such as ongoing infection or rapidly progressive neurological disease.   Problem List Osteoarthritis of right knee (715.96)   Allergies No Known Drug Allergies   Family History Diabetes Mellitus. mother Hypertension. mother, sister and brother Heart  disease in female family member before age 106 Cancer. brother Congestive Heart Failure. father, sister and brother Heart Disease. father and brother Cerebrovascular Accident. mother Rheumatoid Arthritis. father and sister   Social History Marital status. married Living situation. live with spouse Drug/Alcohol Rehab (Previously). no Current work status. disabled Illicit drug use. yes Pain Contract. no Tobacco use. former smoker; smoke(d) 1 pack(s) per day Drug/Alcohol Rehab (Currently). no Alcohol use. never consumed alcohol Children. 3 Post-Surgical Plans. Plan is to look into SNF.   Medication History Omeprazole (20MG  Capsule DR, Oral) Active. Ibuprofen (800MG  Tablet, Oral) Active. Glimepiride (4MG  Tablet, Oral) Active. Restasis (0.05% Emulsion, Ophthalmic) Active. Hydrocodone-Acetaminophen (10-325MG  Tablet, Oral) Active. NovoLOG FlexPen (100UNIT/ML Solution, Subcutaneous) Active. Lantus SoloStar (100UNIT/ML Solution, Subcutaneous) Active. Crestor (5MG  Tablet, Oral) Active. Vitamin D (1000UNIT Capsule, Oral) Active.   Past Surgical History Tonsillectomy Hysterectomy. complete (non-cancerous) Arthroscopy of Shoulder. left Foot Surgery. left Carpal Tunnel Repair. left Colon Polyp Removal - Colonoscopy Gallbladder Surgery. laporoscopic Cataract Surgery. bilateral; with lens implants. Throat Surgery for Cancer   Medical History Sleep Apnea Chronic Pain Gastroesophageal Reflux Disease Peripheral Neuropathy Diabetes Mellitus, Type II Cancer. Throat Diverticulosis Bronchitis. 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 Skin:Not Present- Hives, Itching, Rash, Eczema and Lesions. HEENT:Not Present- Tinnitus, Headache, Double Vision, Visual Loss, Hearing Loss and Dentures. Respiratory:Present-  Chronic Cough (recent bout of Bronchitis). Not Present- Shortness of breath with exertion, Shortness of breath at rest, Allergies and Coughing up blood. Cardiovascular:Not Present- Chest Pain, Racing/skipping heartbeats, Difficulty Breathing Lying Down, Murmur, Swelling and Palpitations. Gastrointestinal:Not Present- Bloody Stool,  Heartburn, Abdominal Pain, Vomiting, Nausea, Constipation, Diarrhea, Difficulty Swallowing, Jaundice and Loss of appetitie. Female Genitourinary:Not Present- Blood in Urine, Urinary frequency, Weak urinary stream, Discharge, Flank Pain, Incontinence, Painful Urination, Urgency, Urinary Retention and Urinating at Night. Musculoskeletal:Present- Joint Swelling and Joint Pain. Not Present- Muscle Weakness, Muscle Pain, Back Pain, Morning Stiffness and Spasms. Neurological:Not Present- Tremor, Dizziness, Blackout spells, Paralysis, Difficulty with balance and Weakness. Psychiatric:Not Present- Insomnia.   Vitals Height: 68 in Pulse: 88 (Regular) Resp.: 14 (Unlabored) BP: 118/64 (Sitting, Left Arm, Standard)    Physical Exam The physical exam findings are as follows:   General Mental Status - Alert, cooperative and good historian. General Appearance- pleasant. Not in acute distress. Orientation- Oriented X3. Build & Nutrition- Well nourished and Well developed.   Head and Neck Head- normocephalic, atraumatic . Neck Global Assessment- supple. no bruit auscultated on the right and no bruit auscultated on the left. Note: wellhealed incision on anterior left neck region   Note: upper dentures  Eye Vision- Wears corrective lenses. Pupil- Bilateral- Regular and Round. Motion- Bilateral- EOMI.   Chest and Lung Exam Auscultation: Breath sounds:- clear at anterior chest wall and - clear at posterior chest wall. Adventitious sounds:- No Adventitious sounds.   Cardiovascular Auscultation:Rhythm- Regular rate and rhythm.  Heart Sounds- S1 WNL and S2 WNL. Murmurs & Other Heart Sounds:Auscultation of the heart reveals - No Murmurs.   Abdomen Inspection:Contour- Generalized moderate distention. Palpation/Percussion:Tenderness- Abdomen is non-tender to palpation. Rigidity (guarding)- Abdomen is soft. Auscultation:Auscultation of the abdomen reveals - Bowel sounds normal.   Female Genitourinary Not done, not pertinent to present illness  Musculoskeletal Well developed female no distress. Evaluation of her hips normal range of motion, no discomfort. Her right knee shows a moderate sized effusion. There is no warmth about the knee. She is tender medial greater than lateral. There is no instability noted about the knee. Pulses, sensation and motor are intact.  Assessment & Plan Osteoarthritis of right knee (715.96)  Note: Plan is for a Right Total Knee Replacement by Dr. Lequita Halt.  Plan is to go to Rehab following the hospital stay.  Avel Peace, PA-C

## 2012-11-30 ENCOUNTER — Encounter (HOSPITAL_COMMUNITY): Payer: Self-pay | Admitting: Anesthesiology

## 2012-11-30 ENCOUNTER — Encounter (HOSPITAL_COMMUNITY): Payer: Self-pay | Admitting: *Deleted

## 2012-11-30 ENCOUNTER — Encounter (HOSPITAL_COMMUNITY)
Admission: RE | Disposition: A | Discharge status: Skilled Nursing Facility | Payer: Self-pay | Source: Ambulatory Visit | Attending: Orthopedic Surgery

## 2012-11-30 ENCOUNTER — Inpatient Hospital Stay (HOSPITAL_COMMUNITY)
Admission: RE | Admit: 2012-11-30 | Discharge: 2012-12-04 | DRG: 470 | Disposition: A | Discharge status: Skilled Nursing Facility | Payer: Medicare Other | Source: Ambulatory Visit | Attending: Orthopedic Surgery | Admitting: Orthopedic Surgery

## 2012-11-30 ENCOUNTER — Inpatient Hospital Stay (HOSPITAL_COMMUNITY): Payer: Medicare Other | Admitting: Anesthesiology

## 2012-11-30 DIAGNOSIS — Z01812 Encounter for preprocedural laboratory examination: Secondary | ICD-10-CM

## 2012-11-30 DIAGNOSIS — Z6839 Body mass index (BMI) 39.0-39.9, adult: Secondary | ICD-10-CM

## 2012-11-30 DIAGNOSIS — M1710 Unilateral primary osteoarthritis, unspecified knee: Principal | ICD-10-CM | POA: Diagnosis present

## 2012-11-30 DIAGNOSIS — E785 Hyperlipidemia, unspecified: Secondary | ICD-10-CM | POA: Diagnosis present

## 2012-11-30 DIAGNOSIS — K219 Gastro-esophageal reflux disease without esophagitis: Secondary | ICD-10-CM | POA: Diagnosis present

## 2012-11-30 DIAGNOSIS — E119 Type 2 diabetes mellitus without complications: Secondary | ICD-10-CM | POA: Diagnosis present

## 2012-11-30 HISTORY — PX: TOTAL KNEE ARTHROPLASTY: SHX125

## 2012-11-30 LAB — GLUCOSE, CAPILLARY
Glucose-Capillary: 297 mg/dL — ABNORMAL HIGH (ref 70–99)
Glucose-Capillary: 260 mg/dL — ABNORMAL HIGH (ref 70–99)
Glucose-Capillary: 245 mg/dL — ABNORMAL HIGH (ref 70–99)
Glucose-Capillary: 265 mg/dL — ABNORMAL HIGH (ref 70–99)
Glucose-Capillary: 290 mg/dL — ABNORMAL HIGH (ref 70–99)
Glucose-Capillary: 383 mg/dL — ABNORMAL HIGH (ref 70–99)

## 2012-11-30 LAB — TYPE AND SCREEN
ABO/RH(D): B POS
Antibody Screen: NEGATIVE

## 2012-11-30 LAB — ABO/RH: ABO/RH(D): B POS

## 2012-11-30 SURGERY — ARTHROPLASTY, KNEE, TOTAL
Anesthesia: Spinal | Site: Knee | Laterality: Right | Wound class: Clean

## 2012-11-30 MED ORDER — MENTHOL 3 MG MT LOZG: 1.0000 | LOZENGE | OROMUCOSAL | Status: DC | PRN

## 2012-11-30 MED ORDER — ACETAMINOPHEN 10 MG/ML IV SOLN: 1000.0000 mg | Freq: Once | INTRAVENOUS | Status: DC

## 2012-11-30 MED ORDER — 0.9 % SODIUM CHLORIDE (POUR BTL) OPTIME
TOPICAL | Status: DC | PRN
  Administered 2012-11-30: 1000 mL

## 2012-11-30 MED ORDER — CEFAZOLIN SODIUM-DEXTROSE 2-3 GM-% IV SOLR
2.0000 g | Freq: Four times a day (QID) | INTRAVENOUS | Status: AC
  Administered 2012-11-30 (×2): 2 g via INTRAVENOUS
  Filled 2012-11-30 (×2): qty 50

## 2012-11-30 MED ORDER — ESMOLOL HCL 10 MG/ML IV SOLN
INTRAVENOUS | Status: DC | PRN
  Administered 2012-11-30: 20 mg via INTRAVENOUS
  Administered 2012-11-30: 10 mg via INTRAVENOUS

## 2012-11-30 MED ORDER — DIPHENHYDRAMINE HCL 12.5 MG/5ML PO ELIX: 12.5000 mg | ORAL_SOLUTION | ORAL | Status: DC | PRN

## 2012-11-30 MED ORDER — LACTATED RINGERS IV SOLN
INTRAVENOUS | Status: DC
  Administered 2012-11-30: 1000 mL via INTRAVENOUS
  Administered 2012-11-30 (×2): via INTRAVENOUS

## 2012-11-30 MED ORDER — GABAPENTIN 300 MG PO CAPS
300.0000 mg | ORAL_CAPSULE | Freq: Every day | ORAL | Status: DC
  Administered 2012-11-30 – 2012-12-03 (×4): 300 mg via ORAL
  Filled 2012-11-30 (×5): qty 1

## 2012-11-30 MED ORDER — RIVAROXABAN 10 MG PO TABS
10.0000 mg | ORAL_TABLET | Freq: Every day | ORAL | Status: DC
  Administered 2012-12-01 – 2012-12-04 (×4): 10 mg via ORAL
  Filled 2012-11-30 (×5): qty 1

## 2012-11-30 MED ORDER — BUPIVACAINE ON-Q PAIN PUMP (FOR ORDER SET NO CHG)
INJECTION | Status: DC
  Filled 2012-11-30: qty 1

## 2012-11-30 MED ORDER — DEXAMETHASONE SODIUM PHOSPHATE 10 MG/ML IJ SOLN: 10.0000 mg | Freq: Once | INTRAMUSCULAR | Status: AC

## 2012-11-30 MED ORDER — DEXTROSE 5 % IV SOLN
3.0000 g | INTRAVENOUS | Status: AC
  Administered 2012-11-30: 3 g via INTRAVENOUS
  Filled 2012-11-30: qty 3000

## 2012-11-30 MED ORDER — LINAGLIPTIN 5 MG PO TABS
5.0000 mg | ORAL_TABLET | Freq: Every day | ORAL | Status: DC
  Administered 2012-11-30 – 2012-12-04 (×5): 5 mg via ORAL
  Filled 2012-11-30 (×5): qty 1

## 2012-11-30 MED ORDER — PROMETHAZINE HCL 25 MG PO TABS: 25.0000 mg | ORAL_TABLET | Freq: Two times a day (BID) | ORAL | Status: DC | PRN

## 2012-11-30 MED ORDER — MIDAZOLAM HCL 5 MG/5ML IJ SOLN
INTRAMUSCULAR | Status: DC | PRN
  Administered 2012-11-30: 2 mg via INTRAVENOUS

## 2012-11-30 MED ORDER — TIZANIDINE HCL 4 MG PO TABS
4.0000 mg | ORAL_TABLET | Freq: Every day | ORAL | Status: DC
  Administered 2012-11-30 – 2012-12-03 (×3): 4 mg via ORAL
  Filled 2012-11-30 (×6): qty 1

## 2012-11-30 MED ORDER — ALBUTEROL SULFATE HFA 108 (90 BASE) MCG/ACT IN AERS
2.0000 | INHALATION_SPRAY | Freq: Four times a day (QID) | RESPIRATORY_TRACT | Status: DC | PRN
  Filled 2012-11-30: qty 6.7

## 2012-11-30 MED ORDER — PROMETHAZINE HCL 25 MG/ML IJ SOLN: 6.2500 mg | INTRAMUSCULAR | Status: DC | PRN

## 2012-11-30 MED ORDER — FLEET ENEMA 7-19 GM/118ML RE ENEM: 1.0000 | ENEMA | Freq: Once | RECTAL | Status: AC | PRN

## 2012-11-30 MED ORDER — ACETAMINOPHEN 325 MG PO TABS
650.0000 mg | ORAL_TABLET | Freq: Four times a day (QID) | ORAL | Status: DC | PRN
  Administered 2012-12-04: 650 mg via ORAL
  Filled 2012-11-30: qty 2

## 2012-11-30 MED ORDER — OXYCODONE HCL 5 MG PO TABS
5.0000 mg | ORAL_TABLET | ORAL | Status: DC | PRN
Start: 2012-11-30 — End: 2012-12-02
  Administered 2012-11-30 (×2): 10 mg via ORAL
  Administered 2012-12-02: 5 mg via ORAL
  Filled 2012-11-30: qty 1
  Filled 2012-11-30 (×2): qty 2

## 2012-11-30 MED ORDER — METOCLOPRAMIDE HCL 5 MG/ML IJ SOLN
INTRAMUSCULAR | Status: DC | PRN
  Administered 2012-11-30: 10 mg via INTRAVENOUS

## 2012-11-30 MED ORDER — MORPHINE SULFATE 2 MG/ML IJ SOLN
1.0000 mg | INTRAMUSCULAR | Status: DC | PRN
  Administered 2012-11-30: 2 mg via INTRAVENOUS

## 2012-11-30 MED ORDER — ONDANSETRON HCL 4 MG/2ML IJ SOLN
4.0000 mg | Freq: Once | INTRAMUSCULAR | Status: AC
  Administered 2012-11-30: 4 mg via INTRAVENOUS

## 2012-11-30 MED ORDER — POLYETHYLENE GLYCOL 3350 17 G PO PACK: 17.0000 g | PACK | Freq: Every day | ORAL | Status: DC | PRN

## 2012-11-30 MED ORDER — PROPOFOL 10 MG/ML IV EMUL
INTRAVENOUS | Status: DC | PRN
  Administered 2012-11-30: 100 ug/kg/min via INTRAVENOUS

## 2012-11-30 MED ORDER — DEXAMETHASONE 6 MG PO TABS
10.0000 mg | ORAL_TABLET | Freq: Once | ORAL | Status: AC
  Administered 2012-12-01: 10 mg via ORAL
  Filled 2012-11-30: qty 1

## 2012-11-30 MED ORDER — KETAMINE HCL 10 MG/ML IJ SOLN
INTRAMUSCULAR | Status: DC | PRN
  Administered 2012-11-30 (×3): 5 mg via INTRAVENOUS
  Administered 2012-11-30: 10 mg via INTRAVENOUS
  Administered 2012-11-30 (×13): 5 mg via INTRAVENOUS

## 2012-11-30 MED ORDER — DEXAMETHASONE SODIUM PHOSPHATE 10 MG/ML IJ SOLN
INTRAMUSCULAR | Status: DC | PRN
  Administered 2012-11-30: 10 mg via INTRAVENOUS

## 2012-11-30 MED ORDER — PROPOFOL 10 MG/ML IV BOLUS
INTRAVENOUS | Status: DC | PRN
  Administered 2012-11-30: 50 mg via INTRAVENOUS

## 2012-11-30 MED ORDER — METHOCARBAMOL 500 MG PO TABS
500.0000 mg | ORAL_TABLET | Freq: Four times a day (QID) | ORAL | Status: DC | PRN
  Administered 2012-12-02 (×2): 500 mg via ORAL
  Filled 2012-11-30 (×2): qty 1

## 2012-11-30 MED ORDER — MEPERIDINE HCL 50 MG/ML IJ SOLN: 6.2500 mg | INTRAMUSCULAR | Status: DC | PRN

## 2012-11-30 MED ORDER — SODIUM CHLORIDE 0.9 % IV SOLN: INTRAVENOUS | Status: DC

## 2012-11-30 MED ORDER — GLIMEPIRIDE 4 MG PO TABS
4.0000 mg | ORAL_TABLET | Freq: Two times a day (BID) | ORAL | Status: DC
  Administered 2012-11-30 – 2012-12-04 (×8): 4 mg via ORAL
  Filled 2012-11-30 (×11): qty 1

## 2012-11-30 MED ORDER — PHENYLEPHRINE HCL 10 MG/ML IJ SOLN
INTRAMUSCULAR | Status: DC | PRN
  Administered 2012-11-30 (×4): 40 ug via INTRAVENOUS

## 2012-11-30 MED ORDER — INSULIN ASPART 100 UNIT/ML ~~LOC~~ SOLN
0.0000 [IU] | Freq: Three times a day (TID) | SUBCUTANEOUS | Status: DC
  Administered 2012-11-30: 5 [IU] via SUBCUTANEOUS
  Administered 2012-12-01: 11 [IU] via SUBCUTANEOUS
  Administered 2012-12-01: 20 [IU] via SUBCUTANEOUS
  Administered 2012-12-02 (×3): 8 [IU] via SUBCUTANEOUS
  Administered 2012-12-03: 3 [IU] via SUBCUTANEOUS
  Administered 2012-12-03: 2 [IU] via SUBCUTANEOUS
  Administered 2012-12-03: 3 [IU] via SUBCUTANEOUS
  Administered 2012-12-04: 2 [IU] via SUBCUTANEOUS
  Administered 2012-12-04: 3 [IU] via SUBCUTANEOUS

## 2012-11-30 MED ORDER — LORATADINE 10 MG PO TABS
10.0000 mg | ORAL_TABLET | Freq: Every day | ORAL | Status: DC
  Administered 2012-11-30 – 2012-12-04 (×5): 10 mg via ORAL
  Filled 2012-11-30 (×5): qty 1

## 2012-11-30 MED ORDER — ONDANSETRON HCL 4 MG PO TABS
4.0000 mg | ORAL_TABLET | Freq: Four times a day (QID) | ORAL | Status: DC | PRN
  Administered 2012-12-02: 4 mg via ORAL
  Filled 2012-11-30: qty 1

## 2012-11-30 MED ORDER — HYDROMORPHONE HCL PF 1 MG/ML IJ SOLN: 0.2500 mg | INTRAMUSCULAR | Status: DC | PRN

## 2012-11-30 MED ORDER — METOCLOPRAMIDE HCL 10 MG PO TABS
5.0000 mg | ORAL_TABLET | Freq: Three times a day (TID) | ORAL | Status: DC | PRN
  Administered 2012-12-02: 10 mg via ORAL
  Filled 2012-11-30: qty 1

## 2012-11-30 MED ORDER — ONDANSETRON HCL 4 MG/2ML IJ SOLN
INTRAMUSCULAR | Status: DC | PRN
  Administered 2012-11-30: 4 mg via INTRAVENOUS

## 2012-11-30 MED ORDER — PHENOL 1.4 % MT LIQD: 1.0000 | OROMUCOSAL | Status: DC | PRN

## 2012-11-30 MED ORDER — CHLORHEXIDINE GLUCONATE 4 % EX LIQD
60.0000 mL | Freq: Once | CUTANEOUS | Status: DC
  Filled 2012-11-30: qty 60

## 2012-11-30 MED ORDER — BISACODYL 10 MG RE SUPP: 10.0000 mg | Freq: Every day | RECTAL | Status: DC | PRN

## 2012-11-30 MED ORDER — SIMVASTATIN 10 MG PO TABS
10.0000 mg | ORAL_TABLET | Freq: Every day | ORAL | Status: DC
  Administered 2012-11-30 – 2012-12-03 (×4): 10 mg via ORAL
  Filled 2012-11-30 (×6): qty 1

## 2012-11-30 MED ORDER — PANTOPRAZOLE SODIUM 40 MG PO TBEC
40.0000 mg | DELAYED_RELEASE_TABLET | Freq: Every day | ORAL | Status: DC
  Administered 2012-11-30: 40 mg via ORAL
  Filled 2012-11-30 (×2): qty 1

## 2012-11-30 MED ORDER — INSULIN GLARGINE 100 UNIT/ML ~~LOC~~ SOLN
60.0000 [IU] | Freq: Two times a day (BID) | SUBCUTANEOUS | Status: DC
  Administered 2012-11-30 – 2012-12-04 (×8): 60 [IU] via SUBCUTANEOUS

## 2012-11-30 MED ORDER — TRAMADOL HCL 50 MG PO TABS
50.0000 mg | ORAL_TABLET | Freq: Four times a day (QID) | ORAL | Status: DC | PRN
  Administered 2012-12-03: 100 mg via ORAL
  Administered 2012-12-03: 50 mg via ORAL
  Filled 2012-11-30: qty 1
  Filled 2012-11-30: qty 2

## 2012-11-30 MED ORDER — BUPIVACAINE LIPOSOME 1.3 % IJ SUSP
20.0000 mL | Freq: Once | INTRAMUSCULAR | Status: AC
  Administered 2012-11-30: 20 mL
  Filled 2012-11-30: qty 20

## 2012-11-30 MED ORDER — INSULIN ASPART 100 UNIT/ML ~~LOC~~ SOLN
5.0000 [IU] | Freq: Once | SUBCUTANEOUS | Status: AC
  Administered 2012-11-30: 5 [IU] via SUBCUTANEOUS

## 2012-11-30 MED ORDER — POTASSIUM CHLORIDE IN NACL 20-0.9 MEQ/L-% IV SOLN
INTRAVENOUS | Status: DC
  Administered 2012-11-30 – 2012-12-02 (×3): via INTRAVENOUS
  Filled 2012-11-30 (×4): qty 1000

## 2012-11-30 MED ORDER — DOCUSATE SODIUM 100 MG PO CAPS
100.0000 mg | ORAL_CAPSULE | Freq: Two times a day (BID) | ORAL | Status: DC
  Administered 2012-11-30 – 2012-12-04 (×8): 100 mg via ORAL

## 2012-11-30 MED ORDER — DIAZEPAM 5 MG PO TABS
10.0000 mg | ORAL_TABLET | Freq: Two times a day (BID) | ORAL | Status: DC | PRN
  Administered 2012-11-30 – 2012-12-01 (×2): 10 mg via ORAL
  Filled 2012-11-30 (×2): qty 2

## 2012-11-30 MED ORDER — METOCLOPRAMIDE HCL 5 MG/ML IJ SOLN: 5.0000 mg | Freq: Three times a day (TID) | INTRAMUSCULAR | Status: DC | PRN

## 2012-11-30 MED ORDER — ACETAMINOPHEN 650 MG RE SUPP: 650.0000 mg | Freq: Four times a day (QID) | RECTAL | Status: DC | PRN

## 2012-11-30 MED ORDER — BUPIVACAINE IN DEXTROSE 0.75-8.25 % IT SOLN
INTRATHECAL | Status: DC | PRN
  Administered 2012-11-30: 1.7 mL via INTRATHECAL

## 2012-11-30 MED ORDER — DEXTROSE 5 % IV SOLN
500.0000 mg | Freq: Four times a day (QID) | INTRAVENOUS | Status: DC | PRN
  Filled 2012-11-30: qty 5

## 2012-11-30 MED ORDER — SODIUM CHLORIDE 0.9 % IR SOLN
Status: DC | PRN
  Administered 2012-11-30: 1000 mL

## 2012-11-30 MED ORDER — LACTATED RINGERS IV SOLN: INTRAVENOUS | Status: DC

## 2012-11-30 MED ORDER — SODIUM CHLORIDE 0.9 % IJ SOLN
INTRAMUSCULAR | Status: DC | PRN
  Administered 2012-11-30: 50 mL

## 2012-11-30 MED ORDER — INSULIN ASPART 100 UNIT/ML ~~LOC~~ SOLN
6.0000 [IU] | Freq: Once | SUBCUTANEOUS | Status: AC
  Administered 2012-11-30: 6 [IU] via SUBCUTANEOUS
  Filled 2012-11-30: qty 1

## 2012-11-30 MED ORDER — ONDANSETRON HCL 4 MG/2ML IJ SOLN: 4.0000 mg | Freq: Four times a day (QID) | INTRAMUSCULAR | Status: DC | PRN

## 2012-11-30 SURGICAL SUPPLY — 51 items
BAG ZIPLOCK 12X15 (MISCELLANEOUS) ×2 IMPLANT
BANDAGE ELASTIC 6 VELCRO ST LF (GAUZE/BANDAGES/DRESSINGS) ×2 IMPLANT
BANDAGE ESMARK 6X9 LF (GAUZE/BANDAGES/DRESSINGS) ×1 IMPLANT
BLADE SAG 18X100X1.27 (BLADE) ×2 IMPLANT
BLADE SAW SGTL 11.0X1.19X90.0M (BLADE) ×2 IMPLANT
BNDG ESMARK 6X9 LF (GAUZE/BANDAGES/DRESSINGS) ×2
BOWL SMART MIX CTS (DISPOSABLE) ×2 IMPLANT
CEMENT HV SMART SET (Cement) ×4 IMPLANT
CLOTH BEACON ORANGE TIMEOUT ST (SAFETY) ×2 IMPLANT
CUFF TOURN SGL QUICK 34 (TOURNIQUET CUFF) ×1
CUFF TRNQT CYL 34X4X40X1 (TOURNIQUET CUFF) ×1 IMPLANT
DRAPE EXTREMITY T 121X128X90 (DRAPE) ×2 IMPLANT
DRAPE POUCH INSTRU U-SHP 10X18 (DRAPES) ×2 IMPLANT
DRAPE U-SHAPE 47X51 STRL (DRAPES) ×2 IMPLANT
DRSG ADAPTIC 3X8 NADH LF (GAUZE/BANDAGES/DRESSINGS) ×2 IMPLANT
DRSG PAD ABDOMINAL 8X10 ST (GAUZE/BANDAGES/DRESSINGS) ×2 IMPLANT
DURAPREP 26ML APPLICATOR (WOUND CARE) ×2 IMPLANT
ELECT REM PT RETURN 9FT ADLT (ELECTROSURGICAL) ×2
ELECTRODE REM PT RTRN 9FT ADLT (ELECTROSURGICAL) ×1 IMPLANT
EVACUATOR 1/8 PVC DRAIN (DRAIN) ×2 IMPLANT
FACESHIELD LNG OPTICON STERILE (SAFETY) ×10 IMPLANT
GLOVE BIO SURGEON STRL SZ8 (GLOVE) ×2 IMPLANT
GLOVE BIOGEL PI IND STRL 8 (GLOVE) ×1 IMPLANT
GLOVE BIOGEL PI INDICATOR 8 (GLOVE) ×1
GLOVE SURG SS PI 6.5 STRL IVOR (GLOVE) ×4 IMPLANT
GOWN STRL NON-REIN LRG LVL3 (GOWN DISPOSABLE) ×6 IMPLANT
GOWN STRL REIN XL XLG (GOWN DISPOSABLE) ×2 IMPLANT
HANDPIECE INTERPULSE COAX TIP (DISPOSABLE) ×1
IMMOBILIZER KNEE 20 (SOFTGOODS) ×2
IMMOBILIZER KNEE 20 THIGH 36 (SOFTGOODS) ×1 IMPLANT
KIT BASIN OR (CUSTOM PROCEDURE TRAY) ×2 IMPLANT
MANIFOLD NEPTUNE II (INSTRUMENTS) ×2 IMPLANT
NDL SAFETY ECLIPSE 18X1.5 (NEEDLE) ×1 IMPLANT
NEEDLE HYPO 18GX1.5 SHARP (NEEDLE) ×1
NS IRRIG 1000ML POUR BTL (IV SOLUTION) ×2 IMPLANT
PACK TOTAL JOINT (CUSTOM PROCEDURE TRAY) ×2 IMPLANT
PADDING CAST COTTON 6X4 STRL (CAST SUPPLIES) ×6 IMPLANT
POSITIONER SURGICAL ARM (MISCELLANEOUS) ×2 IMPLANT
SET HNDPC FAN SPRY TIP SCT (DISPOSABLE) ×1 IMPLANT
SPONGE GAUZE 4X4 12PLY (GAUZE/BANDAGES/DRESSINGS) ×2 IMPLANT
STRIP CLOSURE SKIN 1/2X4 (GAUZE/BANDAGES/DRESSINGS) ×4 IMPLANT
SUCTION FRAZIER 12FR DISP (SUCTIONS) ×2 IMPLANT
SUT MNCRL AB 4-0 PS2 18 (SUTURE) ×2 IMPLANT
SUT VIC AB 2-0 CT1 27 (SUTURE) ×3
SUT VIC AB 2-0 CT1 TAPERPNT 27 (SUTURE) ×3 IMPLANT
SUT VLOC 180 0 24IN GS25 (SUTURE) ×2 IMPLANT
SYR 50ML LL SCALE MARK (SYRINGE) ×2 IMPLANT
TOWEL OR 17X26 10 PK STRL BLUE (TOWEL DISPOSABLE) ×4 IMPLANT
TRAY FOLEY CATH 14FRSI W/METER (CATHETERS) ×2 IMPLANT
WATER STERILE IRR 1500ML POUR (IV SOLUTION) ×4 IMPLANT
WRAP KNEE MAXI GEL POST OP (GAUZE/BANDAGES/DRESSINGS) ×2 IMPLANT

## 2012-11-30 NOTE — Op Note (Signed)
Pre-operative diagnosis- Osteoarthritis  Right knee(s)  Post-operative diagnosis- Osteoarthritis Right knee(s)  Procedure-  Right  Total Knee Arthroplasty  Surgeon- Gus Rankin. Juleah Paradise, MD  Assistant- Dimitri Ped, PA-C   Anesthesia-  Spinal  EBL-* No blood loss amount entered *   Drains Hemovac  Tourniquet time-  Total Tourniquet Time Documented: Thigh (Right) - 34 minutes Total: Thigh (Right) - 34 minutes    Complications- None  Condition-PACU - hemodynamically stable.   Brief Clinical Note  Paula Steele is a 67 y.o. year old female with end stage OA of her right knee with progressively worsening pain and dysfunction. She has constant pain, with activity and at rest and significant functional deficits with difficulties even with ADLs. She has had extensive non-op management including analgesics, injections of cortisone and viscosupplements, and home exercise program, but remains in significant pain with significant dysfunction.Radiographs show bone on bone arthritis medial and patellofemoral. She presents now for right Total Knee Arthroplasty.     Procedure in detail---   The patient is brought into the operating room and positioned supine on the operating table. After successful administration of  Spinal,   a tourniquet is placed high on the  Right thigh(s) and the lower extremity is prepped and draped in the usual sterile fashion. Time out is performed by the operating team and then the  Right lower extremity is wrapped in Esmarch, knee flexed and the tourniquet inflated to 300 mmHg.       A midline incision is made with a ten blade through the subcutaneous tissue to the level of the extensor mechanism. A fresh blade is used to make a medial parapatellar arthrotomy. Soft tissue over the proximal medial tibia is subperiosteally elevated to the joint line with a knife and into the semimembranosus bursa with a Cobb elevator. Soft tissue over the proximal lateral tibia is elevated  with attention being paid to avoiding the patellar tendon on the tibial tubercle. The patella is everted, knee flexed 90 degrees and the ACL and PCL are removed. Findings are bone on bone medial and patellofemoral with large global osteophytes.        The drill is used to create a starting hole in the distal femur and the canal is thoroughly irrigated with sterile saline to remove the fatty contents. The 5 degree Right  valgus alignment guide is placed into the femoral canal and the distal femoral cutting block is pinned to remove 10 mm off the distal femur. Resection is made with an oscillating saw.      The tibia is subluxed forward and the menisci are removed. The extramedullary alignment guide is placed referencing proximally at the medial aspect of the tibial tubercle and distally along the second metatarsal axis and tibial crest. The block is pinned to remove 2mm off the more deficient medial  side. Resection is made with an oscillating saw. Size 3is the most appropriate size for the tibia and the proximal tibia is prepared with the modular drill and keel punch for that size.      The femoral sizing guide is placed and size 3 is most appropriate. Rotation is marked off the epicondylar axis and confirmed by creating a rectangular flexion gap at 90 degrees. The size 3 cutting block is pinned in this rotation and the anterior, posterior and chamfer cuts are made with the oscillating saw. The intercondylar block is then placed and that cut is made.      Trial size 3 tibial component, trial  size 3 posterior stabilized femur and a 10  mm posterior stabilized rotating platform insert trial is placed. Full extension is achieved with excellent varus/valgus and anterior/posterior balance throughout full range of motion. The patella is everted and thickness measured to be 22  mm. Free hand resection is taken to 12 mm, a 35 template is placed, lug holes are drilled, trial patella is placed, and it tracks normally.  Osteophytes are removed off the posterior femur with the trial in place. All trials are removed and the cut bone surfaces prepared with pulsatile lavage. Cement is mixed and once ready for implantation, the size 3 tibial implant, size  3 posterior stabilized femoral component, and the size 35 patella are cemented in place and the patella is held with the clamp. The trial insert is placed and the knee held in full extension. The Exparel (20 ml mixed with 50 ml saline) is injected into the extensor mechanism, posterior capsule, medial and lateral gutters and subcutaneous tissues.  All extruded cement is removed and once the cement is hard the permanent 10 mm posterior stabilized rotating platform insert is placed into the tibial tray.      The wound is copiously irrigated with saline solution and the extensor mechanism closed over a hoemovac drain with #1 PDS suture. The tourniquet is released for a total tourniquet time of 34  minutes. Flexion against gravity is 140 degrees and the patella tracks normally. Subcutaneous tissue is closed with 2.0 vicryl and subcuticular with running 4.0 Monocryl. The incision is cleaned and dried and steri-strips and a bulky sterile dressing are applied. The limb is placed into a knee immobilizer and the patient is awakened and transported to recovery in stable condition.      Please note that a surgical assistant was a medical necessity for this procedure in order to perform it in a safe and expeditious manner. Surgical assistant was necessary to retract the ligaments and vital neurovascular structures to prevent injury to them and also necessary for proper positioning of the limb to allow for anatomic placement of the prosthesis.   Gus Rankin Reeva Davern, MD    11/30/2012, 12:48 PM

## 2012-11-30 NOTE — Interval H&P Note (Signed)
History and Physical Interval Note:  11/30/2012 11:00 AM  Paula Steele  has presented today for surgery, with the diagnosis of right knee oa   The various methods of treatment have been discussed with the patient and family. After consideration of risks, benefits and other options for treatment, the patient has consented to  Procedure(s): TOTAL KNEE ARTHROPLASTY (Right) as a surgical intervention .  The patient's history has been reviewed, patient examined, no change in status, stable for surgery.  I have reviewed the patient's chart and labs.  Questions were answered to the patient's satisfaction.     Loanne Drilling

## 2012-11-30 NOTE — H&P (View-Only) (Signed)
Paula Steele  DOB: 08/17/1946 Married / Language: English / Race: White Female  Date of Admission:  11/30/2012  Chief Complaint:  Right Knee Pain  History of Present Illness The patient is a 67 year old female who comes in for a preoperative History and Physical. The patient is scheduled for a right total knee arthroplasty to be performed by Dr. Frank V. Aluisio, MD at [facility] on 11/30/2012. The patient is a 67 year old female presenting for a post-operative visit. The patient comes in today 10 months (1/2) out from right knee arthroscopy. The patient states that she is doing poorly at this time. They currently describe their pain as moderate to severe.The patient states that the swelling is mild at this time. Note for "Post Knee Arthroscopy": She just finished a Synvisc series. After the third Synvisc injection she felt that the knee got much worse. She has had progressively worsening pain and dysfunction. It hurts her at all times. It is very swollen. She feels as though it is significantly limiting what she can and can not do. She is ready to proceed with knee replacement surgery. They have been treated conservatively in the past for the above stated problem and despite conservative measures, they continue to have progressive pain and severe functional limitations and dysfunction. They have failed non-operative management including home exercise, medications, and injections. It is felt that they would benefit from undergoing total joint replacement. Risks and benefits of the procedure have been discussed with the patient and they elect to proceed with surgery. There are no active contraindications to surgery such as ongoing infection or rapidly progressive neurological disease.   Problem List Osteoarthritis of right knee (715.96)   Allergies No Known Drug Allergies   Family History Diabetes Mellitus. mother Hypertension. mother, sister and brother Heart  disease in female family member before age 55 Cancer. brother Congestive Heart Failure. father, sister and brother Heart Disease. father and brother Cerebrovascular Accident. mother Rheumatoid Arthritis. father and sister   Social History Marital status. married Living situation. live with spouse Drug/Alcohol Rehab (Previously). no Current work status. disabled Illicit drug use. yes Pain Contract. no Tobacco use. former smoker; smoke(d) 1 pack(s) per day Drug/Alcohol Rehab (Currently). no Alcohol use. never consumed alcohol Children. 3 Post-Surgical Plans. Plan is to look into SNF.   Medication History Omeprazole (20MG Capsule DR, Oral) Active. Ibuprofen (800MG Tablet, Oral) Active. Glimepiride (4MG Tablet, Oral) Active. Restasis (0.05% Emulsion, Ophthalmic) Active. Hydrocodone-Acetaminophen (10-325MG Tablet, Oral) Active. NovoLOG FlexPen (100UNIT/ML Solution, Subcutaneous) Active. Lantus SoloStar (100UNIT/ML Solution, Subcutaneous) Active. Crestor (5MG Tablet, Oral) Active. Vitamin D (1000UNIT Capsule, Oral) Active.   Past Surgical History Tonsillectomy Hysterectomy. complete (non-cancerous) Arthroscopy of Shoulder. left Foot Surgery. left Carpal Tunnel Repair. left Colon Polyp Removal - Colonoscopy Gallbladder Surgery. laporoscopic Cataract Surgery. bilateral; with lens implants. Throat Surgery for Cancer   Medical History Sleep Apnea Chronic Pain Gastroesophageal Reflux Disease Peripheral Neuropathy Diabetes Mellitus, Type II Cancer. Throat Diverticulosis Bronchitis. 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 Skin:Not Present- Hives, Itching, Rash, Eczema and Lesions. HEENT:Not Present- Tinnitus, Headache, Double Vision, Visual Loss, Hearing Loss and Dentures. Respiratory:Present-  Chronic Cough (recent bout of Bronchitis). Not Present- Shortness of breath with exertion, Shortness of breath at rest, Allergies and Coughing up blood. Cardiovascular:Not Present- Chest Pain, Racing/skipping heartbeats, Difficulty Breathing Lying Down, Murmur, Swelling and Palpitations. Gastrointestinal:Not Present- Bloody Stool,   Heartburn, Abdominal Pain, Vomiting, Nausea, Constipation, Diarrhea, Difficulty Swallowing, Jaundice and Loss of appetitie. Female Genitourinary:Not Present- Blood in Urine, Urinary frequency, Weak urinary stream, Discharge, Flank Pain, Incontinence, Painful Urination, Urgency, Urinary Retention and Urinating at Night. Musculoskeletal:Present- Joint Swelling and Joint Pain. Not Present- Muscle Weakness, Muscle Pain, Back Pain, Morning Stiffness and Spasms. Neurological:Not Present- Tremor, Dizziness, Blackout spells, Paralysis, Difficulty with balance and Weakness. Psychiatric:Not Present- Insomnia.   Vitals Height: 68 in Pulse: 88 (Regular) Resp.: 14 (Unlabored) BP: 118/64 (Sitting, Left Arm, Standard)    Physical Exam The physical exam findings are as follows:   General Mental Status - Alert, cooperative and good historian. General Appearance- pleasant. Not in acute distress. Orientation- Oriented X3. Build & Nutrition- Well nourished and Well developed.   Head and Neck Head- normocephalic, atraumatic . Neck Global Assessment- supple. no bruit auscultated on the right and no bruit auscultated on the left. Note: wellhealed incision on anterior left neck region   Note: upper dentures  Eye Vision- Wears corrective lenses. Pupil- Bilateral- Regular and Round. Motion- Bilateral- EOMI.   Chest and Lung Exam Auscultation: Breath sounds:- clear at anterior chest wall and - clear at posterior chest wall. Adventitious sounds:- No Adventitious sounds.   Cardiovascular Auscultation:Rhythm- Regular rate and rhythm.  Heart Sounds- S1 WNL and S2 WNL. Murmurs & Other Heart Sounds:Auscultation of the heart reveals - No Murmurs.   Abdomen Inspection:Contour- Generalized moderate distention. Palpation/Percussion:Tenderness- Abdomen is non-tender to palpation. Rigidity (guarding)- Abdomen is soft. Auscultation:Auscultation of the abdomen reveals - Bowel sounds normal.   Female Genitourinary Not done, not pertinent to present illness  Musculoskeletal Well developed female no distress. Evaluation of her hips normal range of motion, no discomfort. Her right knee shows a moderate sized effusion. There is no warmth about the knee. She is tender medial greater than lateral. There is no instability noted about the knee. Pulses, sensation and motor are intact.  Assessment & Plan Osteoarthritis of right knee (715.96)  Note: Plan is for a Right Total Knee Replacement by Dr. Aluisio.  Plan is to go to Rehab following the hospital stay.  Drew Perkins, PA-C  

## 2012-11-30 NOTE — Anesthesia Procedure Notes (Signed)
Spinal Patient location during procedure: OR Staffing Anesthesiologist: Sharod Petsch Performed by: anesthesiologist  Preanesthetic Checklist Completed: patient identified, site marked, surgical consent, pre-op evaluation, timeout performed, IV checked, risks and benefits discussed and monitors and equipment checked Spinal Block Patient position: sitting Prep: Betadine Patient monitoring: heart rate, continuous pulse ox and blood pressure Approach: midline Location: L3-4 Injection technique: single-shot Needle Needle type: Spinocan  Needle gauge: 22 G Needle length: 9 cm Additional Notes Expiration date of kit checked and confirmed. Patient tolerated procedure well, without complications.     

## 2012-11-30 NOTE — Progress Notes (Signed)
Patient's CBG 383 at HS.  No sliding scale coverage ordered for HS.  Paged Alphonsa Overall PA, received new order.  Will continue to monitor.

## 2012-11-30 NOTE — Progress Notes (Signed)
Patient states she has been taking an antibiotic for UTI.  Does not know the name

## 2012-11-30 NOTE — Transfer of Care (Signed)
Immediate Anesthesia Transfer of Care Note  Patient: Paula Steele  Procedure(s) Performed: Procedure(s): TOTAL KNEE ARTHROPLASTY (Right)  Patient Location: PACU  Anesthesia Type:MAC and Spinal  Level of Consciousness: awake, alert , oriented, patient cooperative and responds to stimulation  Airway & Oxygen Therapy: Patient Spontanous Breathing and Patient connected to face mask oxygen  Post-op Assessment: Report given to PACU RN and Post -op Vital signs reviewed and stable, spinal L1  Post vital signs: Reviewed and stable  Complications: No apparent anesthesia complications

## 2012-11-30 NOTE — Anesthesia Postprocedure Evaluation (Signed)
  Anesthesia Post-op Note  Patient: Paula Steele  Procedure(s) Performed: Procedure(s) (LRB): TOTAL KNEE ARTHROPLASTY (Right)  Patient Location: PACU  Anesthesia Type: Spinal  Level of Consciousness: awake and alert   Airway and Oxygen Therapy: Patient Spontanous Breathing  Post-op Pain: mild  Post-op Assessment: Post-op Vital signs reviewed, Patient's Cardiovascular Status Stable, Respiratory Function Stable, Patent Airway and No signs of Nausea or vomiting  Last Vitals:  Filed Vitals:   11/30/12 1345  BP: 139/83  Pulse: 105  Temp:   Resp: 11    Post-op Vital Signs: stable   Complications: No apparent anesthesia complications

## 2012-11-30 NOTE — Plan of Care (Signed)
Problem: Consults Goal: Diagnosis- Total Joint Replacement Primary Total Knee     

## 2012-11-30 NOTE — Anesthesia Preprocedure Evaluation (Addendum)
Anesthesia Evaluation  Patient identified by MRN, date of birth, ID band Patient awake    Reviewed: Allergy & Precautions, H&P , NPO status , Patient's Chart, lab work & pertinent test results  Airway Mallampati: III TM Distance: <3 FB Neck ROM: Limited    Dental no notable dental hx.    Pulmonary shortness of breath and with exertion, former smoker,  breath sounds clear to auscultation  Pulmonary exam normal       Cardiovascular Rhythm:Regular Rate:Normal     Neuro/Psych negative neurological ROS  negative psych ROS   GI/Hepatic Neg liver ROS, GERD-  ,(+) Cirrhosis - (non-alcholic)       ,   Endo/Other  diabetes, Poorly Controlled, Type 2, Insulin DependentMorbid obesity  Renal/GU negative Renal ROS  negative genitourinary   Musculoskeletal negative musculoskeletal ROS (+)   Abdominal (+) + obese,   Peds negative pediatric ROS (+)  Hematology negative hematology ROS (+)   Anesthesia Other Findings   Reproductive/Obstetrics negative OB ROS                           Anesthesia Physical  Anesthesia Plan  ASA: III  Anesthesia Plan: Spinal   Post-op Pain Management:    Induction:   Airway Management Planned: Simple Face Mask  Additional Equipment:   Intra-op Plan:   Post-operative Plan:   Informed Consent: I have reviewed the patients History and Physical, chart, labs and discussed the procedure including the risks, benefits and alternatives for the proposed anesthesia with the patient or authorized representative who has indicated his/her understanding and acceptance.   Dental advisory given  Plan Discussed with: CRNA  Anesthesia Plan Comments:         Anesthesia Quick Evaluation

## 2012-11-30 NOTE — Progress Notes (Signed)
Clinical Social Work Department CLINICAL SOCIAL WORK PLACEMENT NOTE 11/30/2012  Patient:  Paula Steele, Paula Steele  Account Number:  [id number] Admit date:  11/30/2012  Clinical Social Worker:  Doree Albee  Date/time:  11/30/2012 07:22 PM  Clinical Social Work is seeking post-discharge placement for this patient at the following level of care:   SKILLED NURSING   (*CSW will update this form in Epic as items are completed)   11/30/2012  Patient/family provided with [facility] Department of Clinical Social Work's list of facilities offering this level of care within the geographic area requested by the patient (or if unable, by the patient's family).  11/30/2012  Patient/family informed of their freedom to choose among providers that offer the needed level of care, that participate in Medicare, Medicaid or managed care program needed by the patient, have an available bed and are willing to accept the patient.  11/30/2012  Patient/family informed of [facility]' ownership interest in [facility], as well as of the fact that they are under no obligation to receive care at this facility.  PASARR submitted to EDS on 11/30/2012 PASARR number received from EDS on   FL2 transmitted to all facilities in geographic area requested by pt/family on  11/30/2012 FL2 transmitted to all facilities within larger geographic area on   Patient informed that his/her managed care company has contracts with or will negotiate with  certain facilities, including the following:     Patient/family informed of bed offers received:   Patient chooses bed at  Physician recommends and patient chooses bed at    Patient to be transferred to  on   Patient to be transferred to facility by   The following physician request were entered in Epic:   Additional Comments: Patient  requested snf search for [facility] only at this time

## 2012-11-30 NOTE — Preoperative (Signed)
Beta Blockers   Reason not to administer Beta Blockers:Not Applicable, not on home BB 

## 2012-11-30 NOTE — Progress Notes (Signed)
Clinical Social Work Department BRIEF PSYCHOSOCIAL ASSESSMENT 11/30/2012  Patient:  Paula Steele, Paula Steele     Account Number:  [id number]     Admit date:  11/30/2012  Clinical Social Worker:  Doree Albee  Date/Time:  11/30/2012 06:25 PM  Referred by:  RN  Date Referred:  11/30/2012 Referred for  SNF Placement   Other Referral:   Interview type:  Patient Other interview type:    PSYCHOSOCIAL DATA Living Status:  FAMILY Admitted from facility:   Level of care:   Primary support name:  Paula Steele Primary support relationship to patient:  SPOUSE Degree of support available:   strong    CURRENT CONCERNS Current Concerns  Post-Acute Placement   Other Concerns:    SOCIAL WORK ASSESSMENT / PLAN CSW met with pt at bedside to complete psychosocial assessment and discuss possible skilled nursing facility placement. CSW introduced self and CSW role. Pt shared that she had discussed with MD that she may need short term rehab. Pt stated that MD recommended [facility]. CSW discussed with patient that [facility] may be an option over other options may need to be considered if [facility] does have any availability. CSW and patient discussed snf palcement process.    At this time, patient wishes to look at [facility] first and then consider other facilities in [place] and [place] if needed. CSW provided patient with skilled nursing facility list.    Please see placement note for progress regarding placement.   Assessment/plan status:  Psychosocial Support/Ongoing Assessment of Needs Other assessment/ plan:   Information/referral to community resources:   Skilled nursing facility list    PATIENT'S/FAMILY'S RESPONSE TO PLAN OF CARE: Patient thanked csw for concern and support. Pt is motivated to discharged to a short term rehab facility once medically stable. Pt is hopeful for [facility].   Paula Steele, LCSWA  [phone] 11/30/2012 1849pm

## 2012-12-01 ENCOUNTER — Encounter (HOSPITAL_COMMUNITY): Payer: Self-pay | Admitting: Orthopedic Surgery

## 2012-12-01 LAB — BASIC METABOLIC PANEL
BUN: 14 mg/dL (ref 6–23)
CO2: 26 mEq/L (ref 19–32)
Calcium: 8.7 mg/dL (ref 8.4–10.5)
Chloride: 99 mEq/L (ref 96–112)
Creatinine, Ser: 0.89 mg/dL (ref 0.50–1.10)
GFR calc Af Amer: 75 mL/min — ABNORMAL LOW (ref >90)
GFR calc non Af Amer: 65 mL/min — ABNORMAL LOW (ref >90)
Glucose, Bld: 436 mg/dL — ABNORMAL HIGH (ref 70–99)
Potassium: 4.6 mEq/L (ref 3.5–5.1)
Sodium: 133 mEq/L — ABNORMAL LOW (ref 135–145)

## 2012-12-01 LAB — GLUCOSE, CAPILLARY
Glucose-Capillary: 241 mg/dL — ABNORMAL HIGH (ref 70–99)
Glucose-Capillary: 421 mg/dL — ABNORMAL HIGH (ref 70–99)
Glucose-Capillary: 331 mg/dL — ABNORMAL HIGH (ref 70–99)
Glucose-Capillary: 330 mg/dL — ABNORMAL HIGH (ref 70–99)
Glucose-Capillary: 335 mg/dL — ABNORMAL HIGH (ref 70–99)
Glucose-Capillary: 371 mg/dL — ABNORMAL HIGH (ref 70–99)
Glucose-Capillary: 335 mg/dL — ABNORMAL HIGH (ref 70–99)

## 2012-12-01 LAB — CBC
HCT: 35.3 % — ABNORMAL LOW (ref 36.0–46.0)
Hemoglobin: 11.9 g/dL — ABNORMAL LOW (ref 12.0–15.0)
MCH: 28.6 pg (ref 26.0–34.0)
MCHC: 33.7 g/dL (ref 30.0–36.0)
MCV: 84.9 fL (ref 78.0–100.0)
Platelets: 135 10*3/uL — ABNORMAL LOW (ref 150–400)
RBC: 4.16 MIL/uL (ref 3.87–5.11)
RDW: 13.5 % (ref 11.5–15.5)
WBC: 7.3 10*3/uL (ref 4.0–10.5)

## 2012-12-01 MED ORDER — SODIUM CHLORIDE 0.9 % IV BOLUS (SEPSIS)
1000.0000 mL | Freq: Once | INTRAVENOUS | Status: AC
  Administered 2012-12-01: 1000 mL via INTRAVENOUS

## 2012-12-01 MED ORDER — OMEPRAZOLE 20 MG PO CPDR
20.0000 mg | DELAYED_RELEASE_CAPSULE | Freq: Two times a day (BID) | ORAL | Status: DC
  Administered 2012-12-01 – 2012-12-04 (×7): 20 mg via ORAL
  Filled 2012-12-01 (×9): qty 1

## 2012-12-01 MED ORDER — INSULIN ASPART 100 UNIT/ML ~~LOC~~ SOLN
10.0000 [IU] | Freq: Once | SUBCUTANEOUS | Status: AC
  Administered 2012-12-01: 10 [IU] via SUBCUTANEOUS

## 2012-12-01 MED ORDER — HYDROCODONE-ACETAMINOPHEN 5-325 MG PO TABS
1.0000 | ORAL_TABLET | ORAL | Status: DC | PRN
Start: 2012-12-01 — End: 2012-12-04
  Administered 2012-12-01: 1 via ORAL
  Administered 2012-12-01 – 2012-12-03 (×4): 2 via ORAL
  Administered 2012-12-04: 1 via ORAL
  Filled 2012-12-01: qty 1
  Filled 2012-12-01: qty 2
  Filled 2012-12-01: qty 1
  Filled 2012-12-01 (×4): qty 2
  Filled 2012-12-01: qty 1

## 2012-12-01 MED ORDER — INSULIN ASPART 100 UNIT/ML ~~LOC~~ SOLN
30.0000 [IU] | Freq: Once | SUBCUTANEOUS | Status: AC
  Administered 2012-12-01: 17:00:00 via SUBCUTANEOUS

## 2012-12-01 MED ORDER — NON FORMULARY: 20.0000 mg | Freq: Two times a day (BID) | Status: DC

## 2012-12-01 MED ORDER — INSULIN ASPART 100 UNIT/ML ~~LOC~~ SOLN: 20.0000 [IU] | Freq: Once | SUBCUTANEOUS | Status: DC

## 2012-12-01 NOTE — Progress Notes (Signed)
Inpatient Diabetes Program Recommendations  AACE/ADA: New Consensus Statement on Inpatient Glycemic Control (2013)  Target Ranges:  Prepandial:   less than 140 mg/dL      Peak postprandial:   less than 180 mg/dL (1-2 hours)      Critically ill patients:  140 - 180 mg/dL    Results for CHRISTIN, MOLINE (MRN [id number]) as of 12/01/2012 11:55  Ref. Range 12/01/2012 08:12 12/01/2012 11:23  Glucose-Capillary Latest Range: 70-99 mg/dL 409 (H) 811 (H)   Noted home dose of Lantus started last night.  Patient did receive Decadron yesterday and today.  CBGs significantly elevated.  Patient takes fairly large doses of Novolog at home per records.  Per med rec, patient takes anywhere from 12-38 units Novolog tid with meals at home.  Eating 100% of meals so far.    Please consider the following recommendations to improve in-hospital glucose control:  Inpatient Diabetes Program Recommendations Insulin - Meal Coverage: Please consider adding scheduled Novolog meal coverage- Start with Novolog 6 units tid with meals and titrate as needed.  Note: Will follow. Ambrose Finland RN, MSN, CDE Diabetes Coordinator Inpatient Diabetes Program [phone]

## 2012-12-01 NOTE — Care Management Note (Addendum)
    Page 1 of 1   12/04/2012     3:53:17 PM   CARE MANAGEMENT NOTE 12/04/2012  Patient:  Paula Steele, Paula Steele   Account Number:  [id number]  Date Initiated:  12/01/2012  Documentation initiated by:  Colleen Can  Subjective/Objective Assessment:   dx rt knee replacemnt on day of admission     Action/Plan:   SNF  rehab   Anticipated DC Date:  12/03/2012   Anticipated DC Plan:  SKILLED NURSING FACILITY  In-house referral  Clinical Social Worker      DC Planning Services  CM consult      Choice offered to / List presented to:             Status of service:  Completed, signed off Medicare Important Message given?   (If response is "NO", the following Medicare IM given date fields will be blank) Date Medicare IM given:   Date Additional Medicare IM given:    Discharge Disposition:  SKILLED NURSING FACILITY  Per UR Regulation:  Reviewed for med. necessity/level of care/duration of stay  If discussed at Long Length of Stay Meetings, dates discussed:    Comments:  12/01/2012 Colleen Can BSN RN CCM  [phone] CM will follow as needed

## 2012-12-01 NOTE — Progress Notes (Signed)
OT Cancellation Note  Patient Details Name: Paula Steele MRN: [id number] DOB: [birth year]   Cancelled Treatment:    Reason Eval/Treat Not Completed: Other (comment) (screen:  defer to snf)  Alyssandra Hulsebus 12/01/2012, 2:09 PM Marica Otter, OTR/L [phone] 12/01/2012

## 2012-12-01 NOTE — Progress Notes (Signed)
Patient hypotensive and pale.  Patient is alert but disoriented to time.  CBG checked, manual blood pressure checked.  Alphonsa Overall PA paged, order received for bolus. No order received for glucose result.  Will have a stat lab verification.  Will continue to monitor.

## 2012-12-01 NOTE — Progress Notes (Signed)
Physical Therapy Treatment Patient Details Name: CLORINDA WYBLE MRN: [id number] DOB: [birth year] Today's Date: 12/01/2012 Time: 4098-1191 PT Time Calculation (min): 23 min  PT Assessment / Plan / Recommendation Comments on Treatment Session       Follow Up Recommendations  SNF     Does the patient have the potential to tolerate intense rehabilitation     Barriers to Discharge None      Equipment Recommendations  None recommended by PT    Recommendations for Other Services OT consult  Frequency 7X/week   Plan Discharge plan remains appropriate    Precautions / Restrictions Precautions Precautions: Knee;Fall Required Braces or Orthoses: Knee Immobilizer - Right Knee Immobilizer - Right: Discontinue once straight leg raise with < 10 degree lag;On when out of bed or walking Restrictions Weight Bearing Restrictions: No Other Position/Activity Restrictions: WBAT   Pertinent Vitals/Pain 5/10; MEDs requested prior to CPM    Mobility  Bed Mobility Bed Mobility: Sit to Supine Supine to Sit: 1: +2 Total assist;With rails Supine to Sit: Patient Percentage: 60% Sit to Supine: 1: +2 Total assist Sit to Supine: Patient Percentage: 60% Details for Bed Mobility Assistance: cues for sequence and use of L LE to self assist Transfers Transfers: Sit to Stand;Stand to Sit Sit to Stand: 1: +2 Total assist;With armrests;From chair/3-in-1;With upper extremity assist Sit to Stand: Patient Percentage: 60% Stand to Sit: 1: +2 Total assist;To chair/3-in-1;With armrests;With upper extremity assist Stand to Sit: Patient Percentage: 60% Details for Transfer Assistance: cues for use of UEs to self assist and for LE management Ambulation/Gait Ambulation/Gait Assistance: 1: +2 Total assist Ambulation/Gait: Patient Percentage: 70% Ambulation Distance (Feet): 19 Feet Assistive device: Rolling walker Ambulation/Gait Assistance Details: constant cues for posture, sequence, position from RW, stride  length and increased UE WB Gait Pattern: Step-to pattern;Decreased step length - right;Decreased step length - left;Decreased stance time - right    Exercises Total Joint Exercises Ankle Circles/Pumps: AROM;10 reps;Supine;Both Quad Sets: AROM;10 reps;Both;Supine Heel Slides: AAROM;10 reps;Right;Supine Straight Leg Raises: AAROM;Right;10 reps;Supine   PT Diagnosis: Difficulty walking  PT Problem List: Decreased strength;Decreased range of motion;Decreased activity tolerance;Decreased mobility;Obesity;Pain;Decreased knowledge of use of DME PT Treatment Interventions: DME instruction;Gait training;Stair training;Functional mobility training;Therapeutic activities;Therapeutic exercise;Patient/family education   PT Goals Acute Rehab PT Goals PT Goal Formulation: With patient Time For Goal Achievement: 12/08/12 Potential to Achieve Goals: Good Pt will go Supine/Side to Sit: with min assist PT Goal: Supine/Side to Sit - Progress: Progressing toward goal Pt will go Sit to Supine/Side: with min assist PT Goal: Sit to Supine/Side - Progress: Progressing toward goal Pt will go Sit to Stand: with min assist PT Goal: Sit to Stand - Progress: Progressing toward goal Pt will go Stand to Sit: with min assist PT Goal: Stand to Sit - Progress: Progressing toward goal Pt will Ambulate: 51 - 150 feet;with min assist;with rolling walker PT Goal: Ambulate - Progress: Progressing toward goal  Visit Information  Last PT Received On: 12/01/12 Assistance Needed: +2    Subjective Data  Subjective: I'm feeling a little better Patient Stated Goal: Resume previous lifestyle with decreased pain   Cognition  Cognition Overall Cognitive Status: Appears within functional limits for tasks assessed/performed Arousal/Alertness: Awake/alert Orientation Level: Appears intact for tasks assessed Behavior During Session: [facility] for tasks performed    Balance     End of Session PT - End of Session Equipment  Utilized During Treatment: Gait belt;Right knee immobilizer Activity Tolerance: Patient tolerated treatment well Patient left: in bed;with call  bell/phone within reach Nurse Communication: Mobility status;Patient requests pain meds CPM Right Knee CPM Right Knee: On   GP     Ionia Schey 12/01/2012, 3:55 PM

## 2012-12-01 NOTE — Progress Notes (Signed)
Subjective: 1 Day Post-Op Procedure(s) (LRB): TOTAL KNEE ARTHROPLASTY (Right) Patient reports pain as moderate.   Patient seen in rounds with Dr. Lequita Halt. Patient is well, but has had some minor complaints of pain in the knee, requiring pain medications We will start therapy today.  Plan is to go Skilled nursing facility after hospital stay.  Objective: Vital signs in last 24 hours: Temp:  [97.4 F (36.3 C)-99.1 F (37.3 C)] 97.7 F (36.5 C) (02/11 0412) Pulse Rate:  [79-111] 83 (02/11 0626) Resp:  [8-20] 14 (02/11 0412) BP: (66-165)/(42-90) 108/66 mmHg (02/11 0626) SpO2:  [89 %-99 %] 95 % (02/11 0626) Weight:  [120 kg (264 lb 8.8 oz)] 120 kg (264 lb 8.8 oz) (02/10 1950)  Intake/Output from previous day:  Intake/Output Summary (Last 24 hours) at 12/01/12 0757 Last data filed at 12/01/12 0500  Gross per 24 hour  Intake 4696.25 ml  Output   2750 ml  Net 1946.25 ml    Intake/Output this shift:    Labs:  Recent Labs  12/01/12 0223  HGB 11.9*    Recent Labs  12/01/12 0223  WBC 7.3  RBC 4.16  HCT 35.3*  PLT 135*    Recent Labs  12/01/12 0223  NA 133*  K 4.6  CL 99  CO2 26  BUN 14  CREATININE 0.89  GLUCOSE 436*  CALCIUM 8.7   No results found for this basename: LABPT, INR,  in the last 72 hours  EXAM General - Patient is Alert, Appropriate and Oriented Extremity - Neurovascular intact Sensation intact distally Dorsiflexion/Plantar flexion intact Dressing - dressing C/D/I Motor Function - intact, moving foot and toes well on exam.  Hemovac pulled without difficulty.  Past Medical History  Diagnosis Date  . Diverticulosis of colon (without mention of hemorrhage)   . Personal history of other diseases of digestive system     OCCASIONAL ABD. PAIN  . Other chronic nonalcoholic liver disease     SEES DR Arlyce Dice-- ?SCARRING OR CIRRHOSIS PER PET SCAN 2010  . Benign neoplasm of adrenal gland   . Benign neoplasm of colon   . Colon polyp   .  Diabetes mellitus without mention of complication     INSULIN AND ORAL MEDS  . 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 Hyperlipidemia   . Vitamin D deficiency   . Neck pain, chronic S/P RADIAL NECK DISSECTION--  2007    LEFT SIDE OF NECK AND SHOULDER PAIN  . Insomnia DUE TO STRESS  . Shortness of breath on exertion   . Esophageal reflux     CONTROLLED W/ PRILOSEC  . Oropharyngeal dysphagia MILD RIGHT SUPRAGLOTTIC REGION    PT STATES SHE EATS VERY SLOWLY AND CHEWS FOOD WELL  . Weakness of both legs   . Degeneration of intervertebral disc, site unspecified     LUMBAR  . Arthritis      SHOULDERS--   IS CURRENTLY HAS LEFT SHOULDER PAIN  . 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 Left arm pain     and  heaviness since radical neck surg  . Chronic meniscal tear of knee, right   . Urgency incontinence   . Blood transfusion   . Hx: UTI (urinary tract infection)   . Chronic kidney disease     bladder incontinence   . Bronchitis     10/2012-  followed by ER and then PCP- Dr Charm Barges     Assessment/Plan: 1 Day Post-Op Procedure(s) (LRB): TOTAL KNEE ARTHROPLASTY (Right) Principal Problem:   OA (osteoarthritis) of knee  Estimated body mass index is 39.05 kg/(m^2) as calculated from the following:   Height as of this encounter: 5\' 9"  (1.753 m).   Weight as of this encounter: 120 kg (264 lb 8.8 oz). Up with therapy Discharge to SNF  DVT Prophylaxis - Xarelto Weight-Bearing as tolerated to right leg No vaccines. D/C O2 and Pulse OX and try on Room [address]  Patrica Duel 12/01/2012, 7:57 AM

## 2012-12-01 NOTE — Evaluation (Signed)
Physical Therapy Evaluation Patient Details Name: Paula Steele MRN: [id number] DOB: 09-11-1946 Today's Date: 12/01/2012 Time: 1315-1400 PT Time Calculation (min): 45 min  PT Assessment / Plan / Recommendation Clinical Impression  Pt s/p R TKR presents with decreased R LE strength/ROM and post op pain limiting functional mobility    PT Assessment  Patient needs continued PT services    Follow Up Recommendations  SNF    Does the patient have the potential to tolerate intense rehabilitation      Barriers to Discharge None      Equipment Recommendations  None recommended by PT    Recommendations for Other Services OT consult   Frequency 7X/week    Precautions / Restrictions Precautions Precautions: Knee;Fall Required Braces or Orthoses: Knee Immobilizer - Right Knee Immobilizer - Right: Discontinue once straight leg raise with < 10 degree lag;On when out of bed or walking Restrictions Weight Bearing Restrictions: No Other Position/Activity Restrictions: WBAT   Pertinent Vitals/Pain 5/10; premed, cold packs provided      Mobility  Bed Mobility Bed Mobility: Supine to Sit Supine to Sit: 1: +2 Total assist;With rails Supine to Sit: Patient Percentage: 60% Details for Bed Mobility Assistance: cues for sequence and use of L LE to self assist Transfers Transfers: Sit to Stand;Stand to Sit Sit to Stand: 1: +2 Total assist;From bed;From elevated surface;With upper extremity assist Sit to Stand: Patient Percentage: 60% Stand to Sit: 1: +2 Total assist;To chair/3-in-1;With armrests;With upper extremity assist Stand to Sit: Patient Percentage: 60% Details for Transfer Assistance: cues for use of UEs to self assist and for LE management Ambulation/Gait Ambulation/Gait Assistance: 1: +2 Total assist Ambulation/Gait: Patient Percentage: 60% Ambulation Distance (Feet): 3 Feet Assistive device: Rolling walker Ambulation/Gait Assistance Details: cues for posture, sequence and  position from RW - assist for support balance and to advance R LE Gait Pattern: Step-to pattern;Decreased step length - right;Decreased step length - left;Decreased stance time - right    Exercises Total Joint Exercises Ankle Circles/Pumps: AROM;10 reps;Supine;Both Quad Sets: AROM;10 reps;Both;Supine Heel Slides: AAROM;10 reps;Right;Supine Straight Leg Raises: AAROM;Right;10 reps;Supine   PT Diagnosis: Difficulty walking  PT Problem List: Decreased strength;Decreased range of motion;Decreased activity tolerance;Decreased mobility;Obesity;Pain;Decreased knowledge of use of DME PT Treatment Interventions: DME instruction;Gait training;Stair training;Functional mobility training;Therapeutic activities;Therapeutic exercise;Patient/family education   PT Goals Acute Rehab PT Goals PT Goal Formulation: With patient Time For Goal Achievement: 12/08/12 Potential to Achieve Goals: Good Pt will go Supine/Side to Sit: with min assist PT Goal: Supine/Side to Sit - Progress: Goal set today Pt will go Sit to Supine/Side: with min assist PT Goal: Sit to Supine/Side - Progress: Goal set today Pt will go Sit to Stand: with min assist PT Goal: Sit to Stand - Progress: Goal set today Pt will go Stand to Sit: with min assist PT Goal: Stand to Sit - Progress: Goal set today Pt will Ambulate: 51 - 150 feet;with min assist;with rolling walker PT Goal: Ambulate - Progress: Goal set today  Visit Information  Last PT Received On: 12/01/12 Assistance Needed: +2    Subjective Data  Subjective: You mean I have to sit up?  You mean I have to stand up?  You mean I have to walk? 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    Cognition  Cognition Overall Cognitive Status: Appears within functional limits for tasks assessed/performed Arousal/Alertness:  Awake/alert Orientation Level:  Appears intact for tasks assessed Behavior During Session: [facility] for tasks performed    Extremity/Trunk Assessment Right Upper Extremity Assessment RUE ROM/Strength/Tone: [facility] for tasks assessed Left Upper Extremity Assessment LUE ROM/Strength/Tone: WFL for tasks assessed Right Lower Extremity Assessment RLE ROM/Strength/Tone: Deficits RLE ROM/Strength/Tone Deficits: 2/5 quads with aarom -10 - 40 Left Lower Extremity Assessment LLE ROM/Strength/Tone: WFL for tasks assessed   Balance    End of Session PT - End of Session Equipment Utilized During Treatment: Gait belt;Right knee immobilizer Activity Tolerance: Patient limited by fatigue;Patient limited by pain;Other (comment) Patient left: in chair;with call bell/phone within reach Nurse Communication: Mobility status  GP     Paula Steele 12/01/2012, 3:15 PM

## 2012-12-01 NOTE — Progress Notes (Signed)
Stat bmet result obtained, paged Alphonsa Overall PA, new order received.  Will continue to monitor.

## 2012-12-01 NOTE — Progress Notes (Signed)
Utilization review completed.  

## 2012-12-02 LAB — CBC
HCT: 37.4 % (ref 36.0–46.0)
Hemoglobin: 12.7 g/dL (ref 12.0–15.0)
MCH: 28.5 pg (ref 26.0–34.0)
MCHC: 34 g/dL (ref 30.0–36.0)
MCV: 84 fL (ref 78.0–100.0)
Platelets: 177 10*3/uL (ref 150–400)
RBC: 4.45 MIL/uL (ref 3.87–5.11)
RDW: 13.7 % (ref 11.5–15.5)
WBC: 11.7 10*3/uL — ABNORMAL HIGH (ref 4.0–10.5)

## 2012-12-02 LAB — GLUCOSE, CAPILLARY
Glucose-Capillary: 263 mg/dL — ABNORMAL HIGH (ref 70–99)
Glucose-Capillary: 266 mg/dL — ABNORMAL HIGH (ref 70–99)
Glucose-Capillary: 249 mg/dL — ABNORMAL HIGH (ref 70–99)
Glucose-Capillary: 230 mg/dL — ABNORMAL HIGH (ref 70–99)

## 2012-12-02 LAB — BASIC METABOLIC PANEL
BUN: 15 mg/dL (ref 6–23)
CO2: 27 mEq/L (ref 19–32)
Calcium: 9.3 mg/dL (ref 8.4–10.5)
Chloride: 99 mEq/L (ref 96–112)
Creatinine, Ser: 0.59 mg/dL (ref 0.50–1.10)
GFR calc Af Amer: >90 mL/min (ref >90)
GFR calc non Af Amer: >90 mL/min (ref >90)
Glucose, Bld: 299 mg/dL — ABNORMAL HIGH (ref 70–99)
Potassium: 3.8 mEq/L (ref 3.5–5.1)
Sodium: 135 mEq/L (ref 135–145)

## 2012-12-02 MED ORDER — OXYCODONE HCL 5 MG PO TABS
5.0000 mg | ORAL_TABLET | ORAL | Status: DC | PRN
  Administered 2012-12-02 (×2): 5 mg via ORAL
  Administered 2012-12-02: 10 mg via ORAL
  Administered 2012-12-03: 20 mg via ORAL
  Filled 2012-12-02: qty 2
  Filled 2012-12-02 (×2): qty 1
  Filled 2012-12-02: qty 4

## 2012-12-02 NOTE — Progress Notes (Signed)
Physical Therapy Treatment Patient Details Name: Paula Steele MRN: [id number] DOB: 09-05-1946 Today's Date: 12/02/2012 Time: 1478-2956 PT Time Calculation (min): 17 min  PT Assessment / Plan / Recommendation Comments on Treatment Session       Follow Up Recommendations  SNF     Does the patient have the potential to tolerate intense rehabilitation     Barriers to Discharge        Equipment Recommendations  None recommended by PT    Recommendations for Other Services OT consult  Frequency 7X/week   Plan Discharge plan remains appropriate    Precautions / Restrictions Precautions Precautions: Knee;Fall Required Braces or Orthoses: Knee Immobilizer - Right Knee Immobilizer - Right: Discontinue once straight leg raise with < 10 degree lag;On when out of bed or walking Restrictions Weight Bearing Restrictions: No Other Position/Activity Restrictions: WBAT   Pertinent Vitals/Pain     Mobility       Exercises Total Joint Exercises Ankle Circles/Pumps: AROM;Supine;Both;AAROM;20 reps Quad Sets: AROM;Both;Supine;20 reps Heel Slides: AAROM;Right;Supine;20 reps Straight Leg Raises: AAROM;Right;Supine;20 reps   PT Diagnosis:    PT Problem List:   PT Treatment Interventions:     PT Goals Acute Rehab PT Goals PT Goal Formulation: With patient Time For Goal Achievement: 12/08/12 Potential to Achieve Goals: Good Pt will go Supine/Side to Sit: with min assist  Visit Information  Last PT Received On: 12/02/12 Assistance Needed: +1    Subjective Data  Subjective: I've been getting up and down to the bathroom [facility]) Patient Stated Goal: Resume previous lifestyle with decreased pain   Cognition  Cognition Overall Cognitive Status: Appears within functional limits for tasks assessed/performed Arousal/Alertness: Awake/alert Orientation Level: Appears intact for tasks assessed Behavior During Session: [facility] for tasks performed    Balance     End of Session PT - End of  Session Activity Tolerance: Patient limited by fatigue Patient left: in bed;with call bell/phone within reach   GP     [facility] 12/02/2012, 1:43 PM

## 2012-12-02 NOTE — Progress Notes (Signed)
Subjective: 2 Days Post-Op Procedure(s) (LRB): TOTAL KNEE ARTHROPLASTY (Right) Patient reports pain as moderate and severe.   Patient seen in rounds with Dr. Lequita Halt. She had a rough night and not much sleep. Patient is having problems with pain in the knee, requiring pain medications Plan is to go Skilled nursing facility after hospital stay.  Objective: Vital signs in last 24 hours: Temp:  [97.5 F (36.4 C)-98 F (36.7 C)] 97.5 F (36.4 C) (02/12 0981) Pulse Rate:  [82-100] 95 (02/12 0613) Resp:  [14-16] 16 (02/12 0613) BP: (111-167)/(69-98) 155/86 mmHg (02/12 0613) SpO2:  [93 %-95 %] 95 % (02/12 0613)  Intake/Output from previous day:  Intake/Output Summary (Last 24 hours) at 12/02/12 0727 Last data filed at 12/02/12 0600  Gross per 24 hour  Intake   2955 ml  Output   3950 ml  Net   -995 ml    Intake/Output this shift:    Labs:  Recent Labs  12/01/12 0223 12/02/12 0410  HGB 11.9* 12.7    Recent Labs  12/01/12 0223 12/02/12 0410  WBC 7.3 11.7*  RBC 4.16 4.45  HCT 35.3* 37.4  PLT 135* 177    Recent Labs  12/01/12 0223 12/02/12 0410  NA 133* 135  K 4.6 3.8  CL 99 99  CO2 26 27  BUN 14 15  CREATININE 0.89 0.59  GLUCOSE 436* 299*  CALCIUM 8.7 9.3   No results found for this basename: LABPT, INR,  in the last 72 hours  EXAM General - Patient is Alert, Appropriate and Oriented Extremity - Neurovascular intact Sensation intact distally Dorsiflexion/Plantar flexion intact Dressing/Incision - clean, dry, no drainage, healing Motor Function - intact, moving foot and toes well on exam.   Past Medical History  Diagnosis Date  . Diverticulosis of colon (without mention of hemorrhage)   . Personal history of other diseases of digestive system     OCCASIONAL ABD. PAIN  . Other chronic nonalcoholic liver disease     SEES DR Arlyce Dice-- ?SCARRING OR CIRRHOSIS PER PET SCAN 2010  . Benign neoplasm of adrenal gland   . Benign neoplasm of colon   .  Colon polyp   . Diabetes mellitus without mention of complication     INSULIN AND ORAL MEDS  . 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 Hyperlipidemia   . Vitamin D deficiency   . Neck pain, chronic S/P RADIAL NECK DISSECTION--  2007    LEFT SIDE OF NECK AND SHOULDER PAIN  . Insomnia DUE TO STRESS  . Shortness of breath on exertion   . Esophageal reflux     CONTROLLED W/ PRILOSEC  . Oropharyngeal dysphagia MILD RIGHT SUPRAGLOTTIC REGION    PT STATES SHE EATS VERY SLOWLY AND CHEWS FOOD WELL  . Weakness of both legs   . Degeneration of intervertebral disc, site unspecified     LUMBAR  . Arthritis      SHOULDERS--   IS CURRENTLY HAS LEFT SHOULDER PAIN  . 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 Left arm pain     and  heaviness since radical neck surg  . Chronic meniscal tear of knee, right   . Urgency incontinence   . Blood transfusion   . Hx: UTI (urinary tract infection)   . Chronic kidney disease     bladder incontinence   .  Bronchitis     10/2012- followed by ER and then PCP- Dr Charm Barges     Assessment/Plan: 2 Days Post-Op Procedure(s) (LRB): TOTAL KNEE ARTHROPLASTY (Right) Principal Problem:   OA (osteoarthritis) of knee  Estimated body mass index is 39.05 kg/(m^2) as calculated from the following:   Height as of this encounter: 5\' 9"  (1.753 m).   Weight as of this encounter: 120 kg (264 lb 8.8 oz). Up with therapy Plan for discharge tomorrow Discharge to SNF  DVT Prophylaxis - Xarelto Weight-Bearing as tolerated to right leg  PERKINS, ALEXZANDREW 12/02/2012, 7:27 AM

## 2012-12-02 NOTE — Progress Notes (Signed)
Physical Therapy Treatment Patient Details Name: Paula Steele MRN: [id number] DOB: 01-03-1946 Today's Date: 12/02/2012 Time: 1191-4782 PT Time Calculation (min): 19 min  PT Assessment / Plan / Recommendation Comments on Treatment Session       Follow Up Recommendations  SNF     Does the patient have the potential to tolerate intense rehabilitation     Barriers to Discharge        Equipment Recommendations  None recommended by PT    Recommendations for Other Services OT consult  Frequency 7X/week   Plan Discharge plan remains appropriate    Precautions / Restrictions Precautions Precautions: Knee;Fall Required Braces or Orthoses: Knee Immobilizer - Right Knee Immobilizer - Right: Discontinue once straight leg raise with < 10 degree lag;On when out of bed or walking Restrictions Weight Bearing Restrictions: No Other Position/Activity Restrictions: WBAT   Pertinent Vitals/Pain 4/10; premed, ice packs provided    Mobility  Bed Mobility Bed Mobility: Supine to Sit Sit to Supine: 3: Mod assist Details for Bed Mobility Assistance: cues for sequence and use of L LE to self assist Transfers Transfers: Sit to Stand;Stand to Sit Sit to Stand: 1: +2 Total assist;With armrests;From chair/3-in-1;With upper extremity assist Sit to Stand: Patient Percentage: 70% Stand to Sit: 1: +2 Total assist;To chair/3-in-1;With armrests;With upper extremity assist Stand to Sit: Patient Percentage: 70% Details for Transfer Assistance: cues for use of UEs to self assist and for LE management Ambulation/Gait Ambulation/Gait Assistance: 1: +2 Total assist Ambulation/Gait: Patient Percentage: 70% Ambulation Distance (Feet): 21 Feet Assistive device: Rolling walker Ambulation/Gait Assistance Details: cues for sequence, posture, position from RW - assist for support, balance and to advance R LE Gait Pattern: Step-to pattern;Decreased step length - right;Decreased step length - left;Decreased  stance time - right    Exercises     PT Diagnosis:    PT Problem List:   PT Treatment Interventions:     PT Goals Acute Rehab PT Goals PT Goal Formulation: With patient Time For Goal Achievement: 12/08/12 Potential to Achieve Goals: Good Pt will go Supine/Side to Sit: with min assist PT Goal: Supine/Side to Sit - Progress: Progressing toward goal Pt will go Sit to Supine/Side: with min assist PT Goal: Sit to Supine/Side - Progress: Progressing toward goal Pt will go Sit to Stand: with min assist PT Goal: Sit to Stand - Progress: Progressing toward goal Pt will go Stand to Sit: with min assist PT Goal: Stand to Sit - Progress: Progressing toward goal Pt will Ambulate: 51 - 150 feet;with min assist;with rolling walker PT Goal: Ambulate - Progress: Progressing toward goal  Visit Information  Last PT Received On: 12/02/12 Assistance Needed: +2    Subjective Data  Subjective: I'll try to walk a llittle but I want to get back to bed Patient Stated Goal: Resume previous lifestyle with decreased pain   Cognition  Cognition Overall Cognitive Status: Appears within functional limits for tasks assessed/performed Arousal/Alertness: Awake/alert Orientation Level: Appears intact for tasks assessed Behavior During Session: [facility] for tasks performed    Balance     End of Session PT - End of Session Equipment Utilized During Treatment: Gait belt;Right knee immobilizer Activity Tolerance: Patient tolerated treatment well Patient left: in bed;with call bell/phone within reach Nurse Communication: Mobility status;Patient requests pain meds   GP     Ferrah Panagopoulos 12/02/2012, 12:04 PM

## 2012-12-03 LAB — GLUCOSE, CAPILLARY
Glucose-Capillary: 156 mg/dL — ABNORMAL HIGH (ref 70–99)
Glucose-Capillary: 128 mg/dL — ABNORMAL HIGH (ref 70–99)
Glucose-Capillary: 157 mg/dL — ABNORMAL HIGH (ref 70–99)
Glucose-Capillary: 140 mg/dL — ABNORMAL HIGH (ref 70–99)

## 2012-12-03 LAB — CBC
HCT: 33.3 % — ABNORMAL LOW (ref 36.0–46.0)
Hemoglobin: 10.9 g/dL — ABNORMAL LOW (ref 12.0–15.0)
MCH: 27.9 pg (ref 26.0–34.0)
MCHC: 32.7 g/dL (ref 30.0–36.0)
MCV: 85.4 fL (ref 78.0–100.0)
Platelets: 139 10*3/uL — ABNORMAL LOW (ref 150–400)
RBC: 3.9 MIL/uL (ref 3.87–5.11)
RDW: 13.7 % (ref 11.5–15.5)
WBC: 8.1 10*3/uL (ref 4.0–10.5)

## 2012-12-03 MED ORDER — METHOCARBAMOL 500 MG PO TABS: 500.0000 mg | ORAL_TABLET | Freq: Four times a day (QID) | ORAL | Status: DC | PRN

## 2012-12-03 MED ORDER — RIVAROXABAN 10 MG PO TABS: 10.0000 mg | ORAL_TABLET | Freq: Every day | ORAL | Status: DC

## 2012-12-03 MED ORDER — OXYCODONE HCL 5 MG PO TABS: 5.0000 mg | ORAL_TABLET | ORAL | Status: DC | PRN

## 2012-12-03 NOTE — Progress Notes (Signed)
Subjective: 3 Days Post-Op Procedure(s) (LRB): TOTAL KNEE ARTHROPLASTY (Right) Patient reports pain as mild.   Patient seen in rounds with Dr. Lequita Halt. Patient is well, but has had some minor complaints of pain in the knee, requiring pain medications Plan is to go Skilled nursing facility after hospital stay.  Objective: Vital signs in last 24 hours: Temp:  [97.9 F (36.6 C)-98.8 F (37.1 C)] 98.7 F (37.1 C) (02/13 0518) Pulse Rate:  [96-106] 96 (02/13 0518) Resp:  [14-16] 16 (02/13 0518) BP: (122-135)/(65-78) 128/65 mmHg (02/13 0518) SpO2:  [96 %-98 %] 96 % (02/13 0518)  Intake/Output from previous day:  Intake/Output Summary (Last 24 hours) at 12/03/12 0942 Last data filed at 12/03/12 0004  Gross per 24 hour  Intake    720 ml  Output   1050 ml  Net   -330 ml    Intake/Output this shift:    Labs:  Recent Labs  12/01/12 0223 12/02/12 0410 12/03/12 0410  HGB 11.9* 12.7 10.9*    Recent Labs  12/02/12 0410 12/03/12 0410  WBC 11.7* 8.1  RBC 4.45 3.90  HCT 37.4 33.3*  PLT 177 139*    Recent Labs  12/01/12 0223 12/02/12 0410  NA 133* 135  K 4.6 3.8  CL 99 99  CO2 26 27  BUN 14 15  CREATININE 0.89 0.59  GLUCOSE 436* 299*  CALCIUM 8.7 9.3   No results found for this basename: LABPT, INR,  in the last 72 hours  EXAM General - Patient is Alert, Appropriate and Oriented Extremity - Neurovascular intact Sensation intact distally Dorsiflexion/Plantar flexion intact No cellulitis present Dressing/Incision - clean, dry, no drainage, healing Motor Function - intact, moving foot and toes well on exam.   Past Medical History  Diagnosis Date  . Diverticulosis of colon (without mention of hemorrhage)   . Personal history of other diseases of digestive system     OCCASIONAL ABD. PAIN  . Other chronic nonalcoholic liver disease     SEES DR Arlyce Dice-- ?SCARRING OR CIRRHOSIS PER PET SCAN 2010  . Benign neoplasm of adrenal gland   . Benign neoplasm of  colon   . Colon polyp   . Diabetes mellitus without mention of complication     INSULIN AND ORAL MEDS  . 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 Hyperlipidemia   . Vitamin D deficiency   . Neck pain, chronic S/P RADIAL NECK DISSECTION--  2007    LEFT SIDE OF NECK AND SHOULDER PAIN  . Insomnia DUE TO STRESS  . Shortness of breath on exertion   . Esophageal reflux     CONTROLLED W/ PRILOSEC  . Oropharyngeal dysphagia MILD RIGHT SUPRAGLOTTIC REGION    PT STATES SHE EATS VERY SLOWLY AND CHEWS FOOD WELL  . Weakness of both legs   . Degeneration of intervertebral disc, site unspecified     LUMBAR  . Arthritis      SHOULDERS--   IS CURRENTLY HAS LEFT SHOULDER PAIN  . 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 Left arm pain     and  heaviness since radical neck surg  . Chronic meniscal tear of knee, right   . Urgency incontinence   . Blood transfusion   . Hx: UTI (urinary tract infection)   . Chronic kidney disease     bladder incontinence   .  Bronchitis     10/2012- followed by ER and then PCP- Dr Charm Barges     Assessment/Plan: 3 Days Post-Op Procedure(s) (LRB): TOTAL KNEE ARTHROPLASTY (Right) Principal Problem:   OA (osteoarthritis) of knee  Estimated body mass index is 39.05 kg/(m^2) as calculated from the following:   Height as of this encounter: 5\' 9"  (1.753 m).   Weight as of this encounter: 120 kg (264 lb 8.8 oz). Up with therapy Plan for discharge tomorrow Discharge to SNF  DVT Prophylaxis - Xarelto Weight-Bearing as tolerated to right leg  PERKINS, ALEXZANDREW 12/03/2012, 9:42 AM

## 2012-12-03 NOTE — Progress Notes (Signed)
CSW assisting with d/c planning. [facility] contacted . SNF is unable to accept admissions today. SNF bed will be available for pt tomorrow if stable for d/c. Nsg will update pt/ MD. CSW unable to reach pt by phone.  Cori Razor LCSW [phone]

## 2012-12-03 NOTE — Plan of Care (Signed)
Problem: Phase II Progression Outcomes Goal: Ambulates Outcome: Progressing Pt very weak tonight requiring two person max asst.  Pt only able to transfer to [facility] w/ difficulty

## 2012-12-03 NOTE — Progress Notes (Signed)
12/03/12  Pt lethargic but oriented throughout night.  Pt has a flat affect and voices inappropriate words at times but remains oriented.  Pt restless during first half of night stating that her room was a cemetary and she was in a coffin.  Ultram given prn as ordered and pt settled down and slept peacefully for 4-5 hrs w/o complaints.

## 2012-12-03 NOTE — Progress Notes (Signed)
PT Cancellation Note  Patient Details Name: COURTNEY BELLIZZI MRN: [id number] DOB: [birth year]   Cancelled Treatment:    Reason Eval/Treat Not Completed: Medical issues which prohibited therapy;Other (comment) (pt refused due to pain, Pt was up to [facility] with Nsg)   Tamala Ser 12/03/2012, 3:57 PM [phone]

## 2012-12-04 LAB — GLUCOSE, CAPILLARY
Glucose-Capillary: 137 mg/dL — ABNORMAL HIGH (ref 70–99)
Glucose-Capillary: 168 mg/dL — ABNORMAL HIGH (ref 70–99)

## 2012-12-04 NOTE — Progress Notes (Signed)
Subjective: 4 Days Post-Op Procedure(s) (LRB): TOTAL KNEE ARTHROPLASTY (Right) Patient reports pain as mild.   Patient seen in rounds with Dr. Lequita Halt. Patient is well, and has had no acute complaints or problems Patient is ready to go [facility].  Objective: Vital signs in last 24 hours: Temp:  [98.8 F (37.1 C)-100.3 F (37.9 C)] 100.3 F (37.9 C) (02/14 0523) Pulse Rate:  [88-118] 118 (02/14 0523) Resp:  [16-20] 20 (02/14 0523) BP: (116-148)/(71-78) 148/78 mmHg (02/14 0523) SpO2:  [80 %-98 %] 92 % (02/14 0524)  Intake/Output from previous day:  Intake/Output Summary (Last 24 hours) at 12/04/12 0902 Last data filed at 12/03/12 2220  Gross per 24 hour  Intake    720 ml  Output    800 ml  Net    -80 ml    Intake/Output this shift:    Labs:  Recent Labs  12/02/12 0410 12/03/12 0410  HGB 12.7 10.9*    Recent Labs  12/02/12 0410 12/03/12 0410  WBC 11.7* 8.1  RBC 4.45 3.90  HCT 37.4 33.3*  PLT 177 139*    Recent Labs  12/02/12 0410  NA 135  K 3.8  CL 99  CO2 27  BUN 15  CREATININE 0.59  GLUCOSE 299*  CALCIUM 9.3   No results found for this basename: LABPT, INR,  in the last 72 hours  EXAM: General - Patient is Alert, Appropriate and Oriented Extremity - Neurovascular intact Sensation intact distally Dorsiflexion/Plantar flexion intact No cellulitis present Incision - clean, dry, no drainage, healing Motor Function - intact, moving foot and toes well on exam.   Assessment/Plan: 4 Days Post-Op Procedure(s) (LRB): TOTAL KNEE ARTHROPLASTY (Right) Procedure(s) (LRB): TOTAL KNEE ARTHROPLASTY (Right) Past Medical History  Diagnosis Date  . Diverticulosis of colon (without mention of hemorrhage)   . Personal history of other diseases of digestive system     OCCASIONAL ABD. PAIN  . Other chronic nonalcoholic liver disease     SEES DR Arlyce Dice-- ?SCARRING OR CIRRHOSIS PER PET SCAN 2010  . Benign neoplasm of adrenal gland   . Benign neoplasm  of colon   . Colon polyp   . Diabetes mellitus without mention of complication     INSULIN AND ORAL MEDS  . 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 Hyperlipidemia   . Vitamin D deficiency   . Neck pain, chronic S/P RADIAL NECK DISSECTION--  2007    LEFT SIDE OF NECK AND SHOULDER PAIN  . Insomnia DUE TO STRESS  . Shortness of breath on exertion   . Esophageal reflux     CONTROLLED W/ PRILOSEC  . Oropharyngeal dysphagia MILD RIGHT SUPRAGLOTTIC REGION    PT STATES SHE EATS VERY SLOWLY AND CHEWS FOOD WELL  . Weakness of both legs   . Degeneration of intervertebral disc, site unspecified     LUMBAR  . Arthritis      SHOULDERS--   IS CURRENTLY HAS LEFT SHOULDER PAIN  . 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 Left arm pain     and  heaviness since radical neck surg  . Chronic meniscal tear of knee, right   . Urgency incontinence   . Blood transfusion   . Hx: UTI (urinary tract infection)   . Chronic kidney disease     bladder incontinence   . Bronchitis  10/2012- followed by ER and then PCP- Dr Charm Barges    Principal Problem:   OA (osteoarthritis) of knee  Estimated body mass index is 39.05 kg/(m^2) as calculated from the following:   Height as of this encounter: 5\' 9"  (1.753 m).   Weight as of this encounter: 120 kg (264 lb 8.8 oz). Discharge to SNF Diet - Diabetic diet Follow up - in 2 weeks Activity - WBAT Disposition - Skilled nursing facility Condition Upon Discharge - Good D/C Meds - See DC Summary DVT Prophylaxis - Xarelto  Nadeem Romanoski 12/04/2012, 9:02 AM

## 2012-12-04 NOTE — Progress Notes (Signed)
Clinical Social Work Department CLINICAL SOCIAL WORK PLACEMENT NOTE 12/04/2012  Patient:  Paula Steele, Paula Steele  Account Number:  [id number] Admit date:  11/30/2012  Clinical Social Worker:  Doree Albee  Date/time:  11/30/2012 07:22 PM  Clinical Social Work is seeking post-discharge placement for this patient at the following level of care:   SKILLED NURSING   (*CSW will update this form in Epic as items are completed)   11/30/2012  Patient/family provided with [facility] Department of Clinical Social Work's list of facilities offering this level of care within the geographic area requested by the patient (or if unable, by the patient's family).  11/30/2012  Patient/family informed of their freedom to choose among providers that offer the needed level of care, that participate in Medicare, Medicaid or managed care program needed by the patient, have an available bed and are willing to accept the patient.  11/30/2012  Patient/family informed of [facility]' ownership interest in [facility], as well as of the fact that they are under no obligation to receive care at this facility.  PASARR submitted to [employer] on 11/30/2012 PASARR number received from [employer] on   FL2 transmitted to all facilities in geographic area requested by pt/family on  11/30/2012 FL2 transmitted to all facilities within larger geographic area on   Patient informed that his/her managed care company has contracts with or will negotiate with  certain facilities, including the following:     Patient/family informed of bed offers received:  12/01/2012 Patient chooses bed at [facility] Physician recommends and patient chooses bed at    Patient to be transferred to [facility] on  12/04/2012 Patient to be transferred to facility by [facility]  The following physician request were entered in Epic:   Additional Comments: Patient  requested snf search for [facility] only at this time  Cori Razor LCSW  [phone]

## 2012-12-04 NOTE — Discharge Summary (Signed)
Physician Discharge Summary   Patient ID: Paula Steele MRN: [id number] DOB/AGE: [birth year] 68 y.o.  Admit date: 11/30/2012 Discharge date: 12/04/2012  Primary Diagnosis:Osteoarthritis Right knee  Admission Diagnoses:  Past Medical History  Diagnosis Date  . Diverticulosis of colon (without mention of hemorrhage)   . Personal history of other diseases of digestive system     OCCASIONAL ABD. PAIN  . Other chronic nonalcoholic liver disease     SEES DR Arlyce Dice-- ?SCARRING OR CIRRHOSIS PER PET SCAN 2010  . Benign neoplasm of adrenal gland   . Benign neoplasm of colon   . Colon polyp   . Diabetes mellitus without mention of complication     INSULIN AND ORAL MEDS  . 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 Hyperlipidemia   . Vitamin D deficiency   . Neck pain, chronic S/P RADIAL NECK DISSECTION--  2007    LEFT SIDE OF NECK AND SHOULDER PAIN  . Insomnia DUE TO STRESS  . Shortness of breath on exertion   . Esophageal reflux     CONTROLLED W/ PRILOSEC  . Oropharyngeal dysphagia MILD RIGHT SUPRAGLOTTIC REGION    PT STATES SHE EATS VERY SLOWLY AND CHEWS FOOD WELL  . Weakness of both legs   . Degeneration of intervertebral disc, site unspecified     LUMBAR  . Arthritis      SHOULDERS--   IS CURRENTLY HAS LEFT SHOULDER PAIN  . 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 Left arm pain     and  heaviness since radical neck surg  . Chronic meniscal tear of knee, right   . Urgency incontinence   . Blood transfusion   . Hx: UTI (urinary tract infection)   . Chronic kidney disease     bladder incontinence   . Bronchitis     10/2012- followed by ER and then PCP- Dr Charm Barges    Discharge Diagnoses:   Principal Problem:   OA (osteoarthritis) of knee  Estimated body mass index is 39.05 kg/(m^2) as calculated from the following:   Height as of this encounter: 5\' 9"  (1.753 m).   Weight as of this encounter: 120 kg (264 lb 8.8 oz).  Classification of overweight in adults according to BMI (WHO, 1998)   Procedure:  Procedure(s) (LRB): TOTAL KNEE ARTHROPLASTY (Right)   Consults: None  HPI: Paula Steele is a 67 y.o. year old female with end stage OA of her right knee with progressively worsening pain and dysfunction. She has constant pain, with activity and at rest and significant functional deficits with difficulties even with ADLs. She has had extensive non-op management including analgesics, injections of cortisone and viscosupplements, and home exercise program, but remains in significant pain with significant dysfunction.Radiographs show bone on bone arthritis medial and patellofemoral. She presents now for right Total Knee Arthroplasty.   Laboratory Data: Admission on 11/30/2012  Component Date Value Range Status  . ABO/RH(D) 11/30/2012 B POS   Final  . Antibody Screen 11/30/2012 NEG   Final  . Sample Expiration 11/30/2012 12/03/2012   Final  . Glucose-Capillary 11/30/2012 297* 70 - 99 mg/dL Final  . Comment 1 13/05/6577 Documented in Chart   Final  . ABO/RH(D) 11/30/2012 B POS   Final  . Glucose-Capillary 11/30/2012 245* 70 - 99 mg/dL Final  . Comment 1 46/96/2952 Documented in Chart   Final  .  Comment 2 11/30/2012 Notify RN   Final  . Glucose-Capillary 11/30/2012 260* 70 - 99 mg/dL Final  . WBC 16/07/9603 7.3  4.0 - 10.5 K/uL Final  . RBC 12/01/2012 4.16  3.87 - 5.11 MIL/uL Final  . Hemoglobin 12/01/2012 11.9* 12.0 - 15.0 g/dL Final  . HCT 54/06/8118 35.3* 36.0 - 46.0 % Final  . MCV 12/01/2012 84.9  78.0 - 100.0 fL Final  . MCH 12/01/2012 28.6  26.0 - 34.0 pg Final  . MCHC 12/01/2012 33.7  30.0 - 36.0 g/dL Final  . RDW 14/78/2956 13.5  11.5 - 15.5 % Final  . Platelets 12/01/2012 135* 150 - 400 K/uL Final  . Glucose-Capillary 11/30/2012 265* 70 - 99 mg/dL Final  . Glucose-Capillary 11/30/2012 290* 70 - 99 mg/dL Final  . Glucose-Capillary 11/30/2012  383* 70 - 99 mg/dL Final  . Comment 1 21/30/8657 Notify RN   Final  . Sodium 12/01/2012 133* 135 - 145 mEq/L Final  . Potassium 12/01/2012 4.6  3.5 - 5.1 mEq/L Final  . Chloride 12/01/2012 99  96 - 112 mEq/L Final  . CO2 12/01/2012 26  19 - 32 mEq/L Final  . Glucose, Bld 12/01/2012 436* 70 - 99 mg/dL Final  . BUN 84/69/6295 14  6 - 23 mg/dL Final  . Creatinine, Ser 12/01/2012 0.89  0.50 - 1.10 mg/dL Final  . Calcium 28/41/3244 8.7  8.4 - 10.5 mg/dL Final  . GFR calc non Af Amer 12/01/2012 65* >90 mL/min Final  . GFR calc Af Amer 12/01/2012 75* >90 mL/min Final   Comment:                                 The eGFR has been calculated                          using the CKD EPI equation.                          This calculation has not been                          validated in all clinical                          situations.                          eGFR's persistently                          <90 mL/min signify                          possible Chronic Kidney Disease.  . Glucose-Capillary 12/01/2012 421* 70 - 99 mg/dL Final  . Glucose-Capillary 12/01/2012 331* 70 - 99 mg/dL Final  . Glucose-Capillary 12/01/2012 330* 70 - 99 mg/dL Final  . Glucose-Capillary 11/30/2012 241* 70 - 99 mg/dL Final  . Glucose-Capillary 12/01/2012 335* 70 - 99 mg/dL Final  . WBC 10/23/7251 11.7* 4.0 - 10.5 K/uL Final  . RBC 12/02/2012 4.45  3.87 - 5.11 MIL/uL Final  . Hemoglobin 12/02/2012 12.7  12.0 - 15.0 g/dL Final  . HCT 66/44/0347 37.4  36.0 - 46.0 % Final  .  MCV 12/02/2012 84.0  78.0 - 100.0 fL Final  . MCH 12/02/2012 28.5  26.0 - 34.0 pg Final  . MCHC 12/02/2012 34.0  30.0 - 36.0 g/dL Final  . RDW 16/07/9603 13.7  11.5 - 15.5 % Final  . Platelets 12/02/2012 177  150 - 400 K/uL Final   Comment: DELTA CHECK NOTED                          REPEATED TO VERIFY  . Sodium 12/02/2012 135  135 - 145 mEq/L Final  . Potassium 12/02/2012 3.8  3.5 - 5.1 mEq/L Final   DELTA CHECK NOTED  . Chloride 12/02/2012  99  96 - 112 mEq/L Final  . CO2 12/02/2012 27  19 - 32 mEq/L Final  . Glucose, Bld 12/02/2012 299* 70 - 99 mg/dL Final  . BUN 54/06/8118 15  6 - 23 mg/dL Final  . Creatinine, Ser 12/02/2012 0.59  0.50 - 1.10 mg/dL Final   DELTA CHECK NOTED  . Calcium 12/02/2012 9.3  8.4 - 10.5 mg/dL Final  . GFR calc non Af Amer 12/02/2012 >90  >90 mL/min Final  . GFR calc Af Amer 12/02/2012 >90  >90 mL/min Final   Comment:                                 The eGFR has been calculated                          using the CKD EPI equation.                          This calculation has not been                          validated in all clinical                          situations.                          eGFR's persistently                          <90 mL/min signify                          possible Chronic Kidney Disease.  . Glucose-Capillary 12/01/2012 371* 70 - 99 mg/dL Final  . Glucose-Capillary 12/01/2012 335* 70 - 99 mg/dL Final  . Comment 1 14/78/2956 Notify RN   Final  . Glucose-Capillary 12/02/2012 263* 70 - 99 mg/dL Final  . Glucose-Capillary 12/02/2012 266* 70 - 99 mg/dL Final  . WBC 21/30/8657 8.1  4.0 - 10.5 K/uL Final  . RBC 12/03/2012 3.90  3.87 - 5.11 MIL/uL Final  . Hemoglobin 12/03/2012 10.9* 12.0 - 15.0 g/dL Final  . HCT 84/69/6295 33.3* 36.0 - 46.0 % Final  . MCV 12/03/2012 85.4  78.0 - 100.0 fL Final  . MCH 12/03/2012 27.9  26.0 - 34.0 pg Final  . MCHC 12/03/2012 32.7  30.0 - 36.0 g/dL Final  . RDW 28/41/3244 13.7  11.5 - 15.5 % Final  . Platelets 12/03/2012 139* 150 - 400 K/uL Final  . Glucose-Capillary  12/02/2012 249* 70 - 99 mg/dL Final  . Glucose-Capillary 12/02/2012 230* 70 - 99 mg/dL Final  . Glucose-Capillary 12/03/2012 156* 70 - 99 mg/dL Final  . Glucose-Capillary 12/03/2012 128* 70 - 99 mg/dL Final  . Glucose-Capillary 12/03/2012 157* 70 - 99 mg/dL Final  . Comment 1 40/98/1191 Notify RN   Final  . Comment 2 12/03/2012 Documented in Chart   Final  . Glucose-Capillary  12/03/2012 140* 70 - 99 mg/dL Final  . Comment 1 47/82/9562 Documented in Chart   Final  . Comment 2 12/03/2012 Notify RN   Final  . Glucose-Capillary 12/04/2012 137* 70 - 99 mg/dL Final  . Comment 1 13/05/6577 Notify RN   Final  Hospital Outpatient Visit on 11/24/2012  Component Date Value Range Status  . aPTT 11/24/2012 32  24 - 37 seconds Final  . WBC 11/24/2012 6.1  4.0 - 10.5 K/uL Final  . RBC 11/24/2012 5.22* 3.87 - 5.11 MIL/uL Final  . Hemoglobin 11/24/2012 14.9  12.0 - 15.0 g/dL Final  . HCT 46/96/2952 43.9  36.0 - 46.0 % Final  . MCV 11/24/2012 84.1  78.0 - 100.0 fL Final  . MCH 11/24/2012 28.5  26.0 - 34.0 pg Final  . MCHC 11/24/2012 33.9  30.0 - 36.0 g/dL Final  . RDW 84/13/2440 13.4  11.5 - 15.5 % Final  . Platelets 11/24/2012 139* 150 - 400 K/uL Final  . Sodium 11/24/2012 136  135 - 145 mEq/L Final  . Potassium 11/24/2012 3.9  3.5 - 5.1 mEq/L Final  . Chloride 11/24/2012 99  96 - 112 mEq/L Final  . CO2 11/24/2012 25  19 - 32 mEq/L Final  . Glucose, Bld 11/24/2012 284* 70 - 99 mg/dL Final  . BUN 08/17/2535 13  6 - 23 mg/dL Final  . Creatinine, Ser 11/24/2012 0.66  0.50 - 1.10 mg/dL Final  . Calcium 64/40/3474 9.4  8.4 - 10.5 mg/dL Final  . Total Protein 11/24/2012 8.0  6.0 - 8.3 g/dL Final  . Albumin 25/95/6387 3.4* 3.5 - 5.2 g/dL Final  . AST 56/43/3295 67* 0 - 37 U/L Final  . ALT 11/24/2012 64* 0 - 35 U/L Final  . Alkaline Phosphatase 11/24/2012 118* 39 - 117 U/L Final  . Total Bilirubin 11/24/2012 0.5  0.3 - 1.2 mg/dL Final  . GFR calc non Af Amer 11/24/2012 89* >90 mL/min Final  . GFR calc Af Amer 11/24/2012 >90  >90 mL/min Final   Comment:                                 The eGFR has been calculated                          using the CKD EPI equation.                          This calculation has not been                          validated in all clinical                          situations.                          eGFR's persistently                           <  90 mL/min signify                          possible Chronic Kidney Disease.  Marland Kitchen Prothrombin Time 11/24/2012 13.3  11.6 - 15.2 seconds Final  . INR 11/24/2012 1.02  0.00 - 1.49 Final  . Color, Urine 11/24/2012 YELLOW  YELLOW Final  . APPearance 11/24/2012 CLOUDY* CLEAR Final  . Specific Gravity, Urine 11/24/2012 1.031* 1.005 - 1.030 Final  . pH 11/24/2012 5.5  5.0 - 8.0 Final  . Glucose, UA 11/24/2012 >1000* NEGATIVE mg/dL Final  . Hgb urine dipstick 11/24/2012 NEGATIVE  NEGATIVE Final  . Bilirubin Urine 11/24/2012 NEGATIVE  NEGATIVE Final  . Ketones, ur 11/24/2012 NEGATIVE  NEGATIVE mg/dL Final  . Protein, ur 16/07/9603 NEGATIVE  NEGATIVE mg/dL Final  . Urobilinogen, UA 11/24/2012 0.2  0.0 - 1.0 mg/dL Final  . Nitrite 54/06/8118 NEGATIVE  NEGATIVE Final  . Leukocytes, UA 11/24/2012 SMALL* NEGATIVE Final  . MRSA, PCR 11/24/2012 NEGATIVE  NEGATIVE Final  . Staphylococcus aureus 11/24/2012 NEGATIVE  NEGATIVE Final   Comment:                                 The Xpert SA Assay (FDA                          approved for NASAL specimens                          in patients over 24 years of age),                          is one component of                          a comprehensive surveillance                          program.  Test performance has                          been validated by [employer] for patients greater                          than or equal to 82 year old.                          It is not intended                          to diagnose infection nor to                          guide or monitor treatment.  . Squamous Epithelial / LPF 11/24/2012 FEW* RARE Final  . WBC, UA 11/24/2012 3-6  <3 WBC/hpf Final  . Bacteria, UA 11/24/2012 FEW* RARE Final  . Urine-Other 11/24/2012 FEW YEAST   Final  . Specimen Description 11/24/2012 URINE, CLEAN CATCH   Final  .  Special Requests 11/24/2012 NONE   Final  . Culture  Setup Time 11/24/2012 11/25/2012 01:32    Final  . Colony Count 11/24/2012 >=100,000 COLONIES/ML   Final  . Culture 11/24/2012 ESCHERICHIA COLI   Final  . Report Status 11/24/2012 11/26/2012 FINAL   Final  . Organism ID, Bacteria 11/24/2012 ESCHERICHIA COLI   Final     X-Rays:Dg Chest 2 View  11/24/2012  *RADIOLOGY REPORT*  Clinical Data: History of recent bronchitis, preop for right knee surgery  CHEST - 2 VIEW  Comparison: Chest x-ray of 10/22/2012  Findings: No active infiltrate or effusion is seen.  The heart is mildly enlarged and stable.  No acute skeletal abnormality is seen.  IMPRESSION: No active lung disease.  Stable mild cardiomegaly.   Original Report Authenticated By: Dwyane Dee, M.D.     EKG: Orders placed during the hospital encounter of 11/24/12  . EKG 12-LEAD  . EKG 12-LEAD     Hospital Course: Paula Steele is a 67 y.o. who was admitted to [facility]. They were brought to the operating room on 11/30/2012 and underwent Procedure(s): TOTAL KNEE ARTHROPLASTY.  Patient tolerated the procedure well and was later transferred to the recovery room and then to the orthopaedic floor for postoperative care.  They were given PO and IV analgesics for pain control following their surgery.  They were given 24 hours of postoperative antibiotics of  Anti-infectives   Start     Dose/Rate Route Frequency Ordered Stop   11/30/12 1800  ceFAZolin (ANCEF) IVPB 2 g/50 mL premix     2 g 100 mL/hr over 30 Minutes Intravenous Every 6 hours 11/30/12 1447 12/01/12 0022   11/30/12 0847  ceFAZolin (ANCEF) 3 g in dextrose 5 % 50 mL IVPB     3 g 160 mL/hr over 30 Minutes Intravenous On call to O.R. 11/30/12 1610 11/30/12 1144     and started on DVT prophylaxis in the form of Xarelto.   PT and OT were ordered for total joint protocol.  Discharge planning consulted to help with postop disposition and equipment needs.  Patient had a tough night on the evening of surgery and started to get up OOB with therapy on day one and walked over  15 feet. Hemovac drain was pulled without difficulty.  Continued to work with therapy into day two walking over 20 feet.  Dressing was changed on day two and the incision was healing well.  By day three, the patient had progressed with therapy and meeting their goals but no facilities were accepting transfers due to inclement weather so she stayed in the hospital another day.  Incision was healing well.  Patient was seen in rounds on POD 4 and was ready to go to the SNF - [facility].   Discharge Medications: Prior to Admission medications   Medication Sig Start Date End Date Taking? Authorizing Provider  albuterol (PROVENTIL HFA;VENTOLIN HFA) 108 (90 BASE) MCG/ACT inhaler Inhale 2 puffs into the lungs every 6 (six) hours as needed. Wheezing   Yes Historical Provider, MD  diazepam (VALIUM) 10 MG tablet Take 10 mg by mouth every 12 (twelve) hours as needed. Muscle spasms   Yes Historical Provider, MD  fexofenadine (ALLEGRA) 180 MG tablet Take 180 mg by mouth daily.   Yes Historical Provider, MD  gabapentin (NEURONTIN) 300 MG capsule Take 300 mg by mouth at bedtime.   Yes Historical Provider, MD  glimepiride (AMARYL) 4 MG tablet Take 4 mg by mouth 2 (two)  times daily.    Yes Historical Provider, MD  Hyoscyamine Sulfate 0.375 MG TBCR Take 1 tablet (0.375 mg total) by mouth 2 (two) times daily as needed. Take one tab twice a day for abdominal pain for 3-4 days then as needed 02/03/12  Yes Louis Meckel, MD  insulin glargine (LANTUS) 100 UNIT/ML injection Inject 60 Units into the skin 2 (two) times daily. Sliding scale in the mornings   Yes Historical Provider, MD  NOVOLOG FLEXPEN 100 UNIT/ML injection Inject 12-38 Units into the skin 3 (three) times daily. PER Sliding scale 06/25/11  Yes Historical Provider, MD  promethazine (PHENERGAN) 25 MG tablet Take 25-50 mg by mouth 2 (two) times daily as needed. nausea 06/24/11  Yes Historical Provider, MD  simvastatin (ZOCOR) 10 MG tablet Take 10 mg by mouth every  morning.   Yes Historical Provider, MD  sitaGLIPtin (JANUVIA) 100 MG tablet Take 100 mg by mouth every morning.   Yes Historical Provider, MD  tiZANidine (ZANAFLEX) 4 MG tablet Take 4 mg by mouth at bedtime. As needed 06/24/11  Yes Historical Provider, MD  Vitamin D, Ergocalciferol, (DRISDOL) 50000 UNITS CAPS Take 50,000 Units by mouth every Sunday.    Yes Historical Provider, MD  methocarbamol (ROBAXIN) 500 MG tablet Take 1 tablet (500 mg total) by mouth every 6 (six) hours as needed. 12/03/12   Loanne Drilling, MD  omeprazole (PRILOSEC) 20 MG capsule Take 20 mg by mouth 2 (two) times daily.     Historical Provider, MD  oxyCODONE (OXY IR/ROXICODONE) 5 MG immediate release tablet Take 1-4 tablets (5-20 mg total) by mouth every 3 (three) hours as needed. 12/03/12   Loanne Drilling, MD  rivaroxaban (XARELTO) 10 MG TABS tablet Take 1 tablet (10 mg total) by mouth daily with breakfast. 12/03/12   Loanne Drilling, MD  triamcinolone (KENALOG) 0.1 % cream Apply 1 application topically 2 (two) times daily as needed. itching 05/25/11   Historical Provider, MD    Diet: Diabetic diet Activity:WBAT Follow-up:in 2 weeks Disposition - Skilled nursing facility - [facility] Discharged Condition: good      Medication List    STOP taking these medications       HYDROcodone-acetaminophen 10-325 MG per tablet  Commonly known as:  NORCO     ibuprofen 800 MG tablet  Commonly known as:  ADVIL,MOTRIN      TAKE these medications       albuterol 108 (90 BASE) MCG/ACT inhaler  Commonly known as:  PROVENTIL HFA;VENTOLIN HFA  Inhale 2 puffs into the lungs every 6 (six) hours as needed. Wheezing     diazepam 10 MG tablet  Commonly known as:  VALIUM  Take 10 mg by mouth every 12 (twelve) hours as needed. Muscle spasms     fexofenadine 180 MG tablet  Commonly known as:  ALLEGRA  Take 180 mg by mouth daily.     gabapentin 300 MG capsule  Commonly known as:  NEURONTIN  Take 300 mg by mouth at bedtime.      glimepiride 4 MG tablet  Commonly known as:  AMARYL  Take 4 mg by mouth 2 (two) times daily.     Hyoscyamine Sulfate 0.375 MG Tbcr  Take 1 tablet (0.375 mg total) by mouth 2 (two) times daily as needed. Take one tab twice a day for abdominal pain for 3-4 days then as needed     LANTUS 100 UNIT/ML injection  Generic drug:  insulin glargine  Inject 60 Units into the  skin 2 (two) times daily. Sliding scale in the mornings     methocarbamol 500 MG tablet  Commonly known as:  ROBAXIN  Take 1 tablet (500 mg total) by mouth every 6 (six) hours as needed.     NOVOLOG FLEXPEN 100 UNIT/ML injection  Generic drug:  insulin aspart  Inject 12-38 Units into the skin 3 (three) times daily. PER Sliding scale     omeprazole 20 MG capsule  Commonly known as:  PRILOSEC  Take 20 mg by mouth 2 (two) times daily.     oxyCODONE 5 MG immediate release tablet  Commonly known as:  Oxy IR/ROXICODONE  Take 1-4 tablets (5-20 mg total) by mouth every 3 (three) hours as needed.     promethazine 25 MG tablet  Commonly known as:  PHENERGAN  Take 25-50 mg by mouth 2 (two) times daily as needed. nausea     rivaroxaban 10 MG Tabs tablet  Commonly known as:  XARELTO  Take 1 tablet (10 mg total) by mouth daily with breakfast.     simvastatin 10 MG tablet  Commonly known as:  ZOCOR  Take 10 mg by mouth every morning.     sitaGLIPtin 100 MG tablet  Commonly known as:  JANUVIA  Take 100 mg by mouth every morning.     tiZANidine 4 MG tablet  Commonly known as:  ZANAFLEX  Take 4 mg by mouth at bedtime. As needed     triamcinolone cream 0.1 %  Commonly known as:  KENALOG  Apply 1 application topically 2 (two) times daily as needed. itching     Vitamin D (Ergocalciferol) 50000 UNITS Caps  Commonly known as:  DRISDOL  Take 50,000 Units by mouth every Sunday.           Follow-up Information   Follow up with Loanne Drilling, MD. Schedule an appointment as soon as possible for a visit on 12/15/2012. (Call  [phone] Monday to make the appointment)    Contact information:   [address], [address] [place] [address] [phone]       Signed: Patrica Duel 12/04/2012, 9:03 AM

## 2012-12-04 NOTE — Progress Notes (Signed)
Physical Therapy Treatment Patient Details Name: Paula Steele MRN: [id number] DOB: [birth year] Today's Date: 12/04/2012 Time: 2725-3664 PT Time Calculation (min): 52 min  PT Assessment / Plan / Recommendation Comments on Treatment Session       Follow Up Recommendations  SNF     Does the patient have the potential to tolerate intense rehabilitation     Barriers to Discharge        Equipment Recommendations  None recommended by PT    Recommendations for Other Services OT consult  Frequency 7X/week   Plan Discharge plan remains appropriate    Precautions / Restrictions Precautions Precautions: Knee;Fall Required Braces or Orthoses: Knee Immobilizer - Right Knee Immobilizer - Right: Discontinue once straight leg raise with < 10 degree lag;On when out of bed or walking Restrictions Weight Bearing Restrictions: No Other Position/Activity Restrictions: WBAT   Pertinent Vitals/Pain 7/10; premed, RN aware, cold packs provided    Mobility  Bed Mobility Bed Mobility: Supine to Sit Supine to Sit: 3: Mod assist Details for Bed Mobility Assistance: cues for sequence and use of L LE to self assist Transfers Transfers: Sit to Stand;Stand to Sit Sit to Stand: 3: Mod assist;From bed;With upper extremity assist;From chair/3-in-1 Stand to Sit: 3: Mod assist;To chair/3-in-1;With upper extremity assist;With armrests Details for Transfer Assistance: cues for use of UEs to self assist and for LE management Ambulation/Gait Ambulation/Gait Assistance: 1: +2 Total assist Ambulation Distance (Feet): 5 Feet (and 3) Assistive device: Rolling walker Ambulation/Gait Assistance Details: cues for posture, sequence, and position from RW Gait Pattern: Step-to pattern;Decreased step length - right;Decreased step length - left;Decreased stance time - right    Exercises Total Joint Exercises Ankle Circles/Pumps: AROM;Both;20 reps;Supine Quad Sets: AROM;Both;Supine;20 reps Heel Slides:  AAROM;Right;Supine;10 reps Straight Leg Raises: AAROM;Right;Supine;20 reps   PT Diagnosis:    PT Problem List:   PT Treatment Interventions:     PT Goals Acute Rehab PT Goals PT Goal Formulation: With patient Time For Goal Achievement: 12/08/12 Potential to Achieve Goals: Good Pt will go Supine/Side to Sit: with min assist PT Goal: Supine/Side to Sit - Progress: Progressing toward goal Pt will go Sit to Supine/Side: with min assist PT Goal: Sit to Supine/Side - Progress: Progressing toward goal Pt will go Sit to Stand: with min assist PT Goal: Sit to Stand - Progress: Progressing toward goal Pt will go Stand to Sit: with min assist PT Goal: Stand to Sit - Progress: Progressing toward goal Pt will Ambulate: 51 - 150 feet;with min assist;with rolling walker PT Goal: Ambulate - Progress: Progressing toward goal  Visit Information  Last PT Received On: 12/04/12 Assistance Needed: +2    Subjective Data  Subjective: I need to use the bathroom Patient Stated Goal: Resume previous lifestyle with decreased pain   Cognition  Cognition Overall Cognitive Status: Appears within functional limits for tasks assessed/performed Arousal/Alertness: Awake/alert Orientation Level: Appears intact for tasks assessed Behavior During Session: [facility] for tasks performed    Balance     End of Session PT - End of Session Equipment Utilized During Treatment: Gait belt;Right knee immobilizer Activity Tolerance: Patient limited by fatigue;Patient limited by pain Patient left: in chair;with call bell/phone within reach Nurse Communication: Mobility status   GP     Paula Steele 12/04/2012, 12:41 PM

## 2012-12-06 ENCOUNTER — Encounter (HOSPITAL_COMMUNITY): Payer: Self-pay

## 2012-12-06 ENCOUNTER — Emergency Department (HOSPITAL_COMMUNITY): Payer: Medicare Other

## 2012-12-06 ENCOUNTER — Inpatient Hospital Stay (HOSPITAL_COMMUNITY)
Admission: EM | Admit: 2012-12-06 | Discharge: 2012-12-08 | DRG: 064 | Disposition: A | Discharge status: Home Health | Payer: Medicare Other | Attending: Internal Medicine | Admitting: Internal Medicine

## 2012-12-06 DIAGNOSIS — G9340 Encephalopathy, unspecified: Secondary | ICD-10-CM | POA: Diagnosis present

## 2012-12-06 DIAGNOSIS — Z79899 Other long term (current) drug therapy: Secondary | ICD-10-CM

## 2012-12-06 DIAGNOSIS — I6350 Cerebral infarction due to unspecified occlusion or stenosis of unspecified cerebral artery: Principal | ICD-10-CM | POA: Diagnosis present

## 2012-12-06 DIAGNOSIS — I2510 Atherosclerotic heart disease of native coronary artery without angina pectoris: Secondary | ICD-10-CM | POA: Diagnosis present

## 2012-12-06 DIAGNOSIS — E11319 Type 2 diabetes mellitus with unspecified diabetic retinopathy without macular edema: Secondary | ICD-10-CM | POA: Diagnosis present

## 2012-12-06 DIAGNOSIS — E1139 Type 2 diabetes mellitus with other diabetic ophthalmic complication: Secondary | ICD-10-CM | POA: Diagnosis present

## 2012-12-06 DIAGNOSIS — E785 Hyperlipidemia, unspecified: Secondary | ICD-10-CM | POA: Diagnosis present

## 2012-12-06 DIAGNOSIS — R4182 Altered mental status, unspecified: Secondary | ICD-10-CM

## 2012-12-06 DIAGNOSIS — Z8719 Personal history of other diseases of the digestive system: Secondary | ICD-10-CM

## 2012-12-06 DIAGNOSIS — E119 Type 2 diabetes mellitus without complications: Secondary | ICD-10-CM

## 2012-12-06 DIAGNOSIS — Z96659 Presence of unspecified artificial knee joint: Secondary | ICD-10-CM

## 2012-12-06 DIAGNOSIS — M1710 Unilateral primary osteoarthritis, unspecified knee: Secondary | ICD-10-CM | POA: Diagnosis present

## 2012-12-06 DIAGNOSIS — I639 Cerebral infarction, unspecified: Secondary | ICD-10-CM | POA: Diagnosis present

## 2012-12-06 DIAGNOSIS — K219 Gastro-esophageal reflux disease without esophagitis: Secondary | ICD-10-CM | POA: Diagnosis present

## 2012-12-06 DIAGNOSIS — Z794 Long term (current) use of insulin: Secondary | ICD-10-CM

## 2012-12-06 DIAGNOSIS — K7460 Unspecified cirrhosis of liver: Secondary | ICD-10-CM | POA: Diagnosis present

## 2012-12-06 LAB — CBC WITH DIFFERENTIAL/PLATELET
Basophils Absolute: 0 10*3/uL (ref 0.0–0.1)
Basophils Relative: 0 % (ref 0–1)
Eosinophils Absolute: 0.1 10*3/uL (ref 0.0–0.7)
Eosinophils Relative: 2 % (ref 0–5)
HCT: 32.3 % — ABNORMAL LOW (ref 36.0–46.0)
Hemoglobin: 10.9 g/dL — ABNORMAL LOW (ref 12.0–15.0)
Lymphocytes Relative: 28 % (ref 12–46)
Lymphs Abs: 1.7 10*3/uL (ref 0.7–4.0)
MCH: 28.6 pg (ref 26.0–34.0)
MCHC: 33.7 g/dL (ref 30.0–36.0)
MCV: 84.8 fL (ref 78.0–100.0)
Monocytes Absolute: 0.4 10*3/uL (ref 0.1–1.0)
Monocytes Relative: 7 % (ref 3–12)
Neutro Abs: 3.8 10*3/uL (ref 1.7–7.7)
Neutrophils Relative %: 63 % (ref 43–77)
Platelets: 163 10*3/uL (ref 150–400)
RBC: 3.81 MIL/uL — ABNORMAL LOW (ref 3.87–5.11)
RDW: 13.9 % (ref 11.5–15.5)
WBC: 6.1 10*3/uL (ref 4.0–10.5)

## 2012-12-06 LAB — URINALYSIS, ROUTINE W REFLEX MICROSCOPIC
Bilirubin Urine: NEGATIVE
Glucose, UA: NEGATIVE mg/dL
Hgb urine dipstick: NEGATIVE
Ketones, ur: 15 mg/dL — AB
Nitrite: NEGATIVE
Protein, ur: NEGATIVE mg/dL
Specific Gravity, Urine: 1.013 (ref 1.005–1.030)
Urobilinogen, UA: 0.2 mg/dL (ref 0.0–1.0)
pH: 6 (ref 5.0–8.0)

## 2012-12-06 LAB — GLUCOSE, CAPILLARY: Glucose-Capillary: 228 mg/dL — ABNORMAL HIGH (ref 70–99)

## 2012-12-06 LAB — URINE MICROSCOPIC-ADD ON

## 2012-12-06 LAB — BASIC METABOLIC PANEL
BUN: 15 mg/dL (ref 6–23)
CO2: 28 mEq/L (ref 19–32)
Calcium: 9.1 mg/dL (ref 8.4–10.5)
Chloride: 99 mEq/L (ref 96–112)
Creatinine, Ser: 0.77 mg/dL (ref 0.50–1.10)
GFR calc Af Amer: >90 mL/min (ref >90)
GFR calc non Af Amer: 84 mL/min — ABNORMAL LOW (ref >90)
Glucose, Bld: 180 mg/dL — ABNORMAL HIGH (ref 70–99)
Potassium: 3.8 mEq/L (ref 3.5–5.1)
Sodium: 133 mEq/L — ABNORMAL LOW (ref 135–145)

## 2012-12-06 MED ORDER — SODIUM CHLORIDE 0.9 % IV SOLN: Freq: Once | INTRAVENOUS | Status: DC

## 2012-12-06 NOTE — ED Notes (Signed)
Patient from [facility]. 2 days ago staff noticed patient confused and nonsensical in verbage. Patient had blood sugar of 29. d10 given came up to 290.  No complaint of pain.

## 2012-12-06 NOTE — ED Notes (Signed)
Patients daughter called upset. Daughter states that mother's mental status is usually 'normal'. Informed daughter that tests will be run and to call back in 1.5 hours. Oncoming nursing made aware

## 2012-12-06 NOTE — ED Notes (Signed)
Pt's daughter called from [place] asking about the care progression of her mother.  Nursing stated that results were pending and that if she called back at 2130 we possibly could have more results.

## 2012-12-06 NOTE — ED Notes (Signed)
Pt daughter called.  Pt daughter questioned time line of pt's arrival and care in the ED.  Pt's daughter stated that her mother had bee here for 6 hours.  Pts daughter stated that her father was told that her mother had left the nursing facility four hours prior to arrival at ED.  Pt's daughter states that something has happened to her mother.  Pt'sdaughter was given timeline but became upset.  Pt's daughter stated "I am writing all of this down." Something is terribly wrong."  Pt's daughter then hung up abruptly.

## 2012-12-06 NOTE — ED Notes (Signed)
Pt's daughter Reather Littler.  Phone number [phone]

## 2012-12-06 NOTE — ED Provider Notes (Addendum)
History     CSN: [id number]  Arrival date & time 12/06/12  1847   First MD Initiated Contact with Patient 12/06/12 2143      Chief Complaint  Patient presents with  . Altered Mental Status    (Consider location/radiation/quality/duration/timing/severity/associated sxs/prior treatment) Patient is a 67 y.o. female presenting with altered mental status.  Altered Mental Status   Level 5 caveat due to confusion Pt with multiple medical problems was recently admitted for R total knee arthoplasty and subsequently discharged to [facility] for rehab. Staff there reports she has been intermittently agitated, belligerent and violent with staff there since arrival 2 days ago. She had a more severe outburst this afternoon, threatening staff and grabbing them. This is apparently not her baseline. No family is available at the bedside.   Past Medical History  Diagnosis Date  . Diverticulosis of colon (without mention of hemorrhage)   . Personal history of other diseases of digestive system     OCCASIONAL ABD. PAIN  . Other chronic nonalcoholic liver disease     SEES DR Arlyce Dice-- ?SCARRING OR CIRRHOSIS PER PET SCAN 2010  . Benign neoplasm of adrenal gland   . Benign neoplasm of colon   . Colon polyp   . Diabetes mellitus without mention of complication     INSULIN AND ORAL MEDS  . 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 Hyperlipidemia   . Vitamin D deficiency   . Neck pain, chronic S/P RADIAL NECK DISSECTION--  2007    LEFT SIDE OF NECK AND SHOULDER PAIN  . Insomnia DUE TO STRESS  . Shortness of breath on exertion   . Esophageal reflux     CONTROLLED W/ PRILOSEC  . Oropharyngeal dysphagia MILD RIGHT SUPRAGLOTTIC REGION    PT STATES SHE EATS VERY SLOWLY AND CHEWS FOOD WELL  . Weakness of both legs   . Degeneration of intervertebral disc, site unspecified     LUMBAR  . Arthritis      SHOULDERS--   IS CURRENTLY HAS LEFT SHOULDER PAIN  . 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 Left arm pain     and  heaviness since radical neck surg  . Chronic meniscal tear of knee, right   . Urgency incontinence   . Blood transfusion   . Hx: UTI (urinary tract infection)   . Chronic kidney disease     bladder incontinence   . Bronchitis     10/2012- followed by ER and then PCP- Dr Charm Barges     Past Surgical History  Procedure Laterality Date  . Shoulder surgery  1990    LEFT  . Laparoscopic salpingoopherectomy  2001    RIGHT  W/ LYSIS AHESIONS  . Abdominal hysterectomy  1984    W/  LSO  . Neck mass excision  2007    LEFT  . Radical neck dissection  2007    W/ LEFT TONSILLECTOMY AND LEFT EXCISION LEFT TONGUE BASE DUE TO CANCER  . Direct laryngoscopy  2007    W/ LEFT NECK MASS EXCISION  . Tonsillectomy  01-31-2009    RIGHT (DUE TO MASS)  . Tonsillectomy  2007    LEFT W/ NECK DISSECTION DUE TO CANCER  . Cholecystectomy  15 YRS AGO  . Excision left sebacous cyst  05-14-2011 W/  LOCAL  . Cataract extraction w/ intraocular  lens  implant, bilateral    . Knee arthroscopy  09/26/2011    Procedure: ARTHROSCOPY KNEE;  Surgeon: Loanne Drilling;  Location: [facility];  Service: Orthopedics;  Laterality: Right;  right knee arthroscopy with meniscal tear  . Knee arthroscopy  03/25/2012    Procedure: ARTHROSCOPY KNEE;  Surgeon: Loanne Drilling, MD;  Location: [facility];  Service: Orthopedics;  Laterality: Right;  With Debridement of medial lateral meniscus  . Total knee arthroplasty Right 11/30/2012    Procedure: TOTAL KNEE ARTHROPLASTY;  Surgeon: Loanne Drilling, MD;  Location: [facility];  Service: Orthopedics;  Laterality: Right;    Family History  Problem Relation Age of Onset  . Thyroid cancer Brother   . Diabetes Mother   . Diabetes Brother   . Heart disease Father   . Heart disease Brother     History  Substance Use Topics  . Smoking status: Former  Smoker -- 15 years    Types: Cigarettes    Quit date: 09/22/1984  . Smokeless tobacco: Never Used  . Alcohol Use: No    OB History   Grav Para Term Preterm Abortions TAB SAB Ect Mult Living                  Review of Systems  Psychiatric/Behavioral: Positive for altered mental status.   Unable to assess due to mental status.     Allergies  Review of patient's allergies indicates no known allergies.  Home Medications   Current Outpatient Rx  Name  Route  Sig  Dispense  Refill  . albuterol (PROVENTIL HFA;VENTOLIN HFA) 108 (90 BASE) MCG/ACT inhaler   Inhalation   Inhale 2 puffs into the lungs every 6 (six) hours as needed. Wheezing         . diazepam (VALIUM) 10 MG tablet   Oral   Take 5 mg by mouth every 8 (eight) hours as needed for anxiety. Muscle spasms         . fexofenadine (ALLEGRA) 180 MG tablet   Oral   Take 180 mg by mouth daily.         Marland Kitchen gabapentin (NEURONTIN) 300 MG capsule   Oral   Take 300 mg by mouth at bedtime.         Marland Kitchen glimepiride (AMARYL) 4 MG tablet   Oral   Take 4 mg by mouth 2 (two) times daily.          Marland Kitchen HYDROcodone-acetaminophen (NORCO/VICODIN) 5-325 MG per tablet   Oral   Take 1 tablet by mouth every 4 (four) hours as needed for pain.         Marland Kitchen insulin glargine (LANTUS) 100 UNIT/ML injection   Subcutaneous   Inject 60 Units into the skin 2 (two) times daily. Sliding scale in the mornings         . NOVOLOG FLEXPEN 100 UNIT/ML injection   Subcutaneous   Inject 12-38 Units into the skin 3 (three) times daily. PER Sliding scale         . omeprazole (PRILOSEC) 20 MG capsule   Oral   Take 20 mg by mouth 2 (two) times daily.          . rivaroxaban (XARELTO) 10 MG TABS tablet   Oral   Take 1 tablet (10 mg total) by mouth daily with breakfast.   16 tablet   0   . simvastatin (ZOCOR) 10 MG tablet   Oral   Take 10 mg by mouth every  morning.         . sitaGLIPtin (JANUVIA) 100 MG tablet   Oral   Take 100 mg by  mouth every morning.         Marland Kitchen tiZANidine (ZANAFLEX) 4 MG tablet   Oral   Take 4 mg by mouth at bedtime. As needed         . triamcinolone (KENALOG) 0.1 % cream   Topical   Apply 1 application topically 2 (two) times daily as needed. itching         . Vitamin D, Ergocalciferol, (DRISDOL) 50000 UNITS CAPS   Oral   Take 50,000 Units by mouth every Sunday.          . methocarbamol (ROBAXIN) 500 MG tablet   Oral   Take 1 tablet (500 mg total) by mouth every 6 (six) hours as needed.   80 tablet   1     BP 112/44  Pulse 81  Temp(Src) 98.4 F (36.9 C) (Oral)  Resp 18  SpO2 93%  Physical Exam  Nursing note and vitals reviewed. Constitutional: She appears well-developed and well-nourished.  HENT:  Head: Normocephalic and atraumatic.  Eyes: EOM are normal. Pupils are equal, round, and reactive to light.  Neck: Normal range of motion. Neck supple.  Cardiovascular: Normal rate, normal heart sounds and intact distal pulses.   Pulmonary/Chest: Effort normal and breath sounds normal.  Abdominal: Bowel sounds are normal. She exhibits no distension. There is no tenderness.  Musculoskeletal: She exhibits no edema and no tenderness.  R leg in knee immobilizer post-op, distal pulses are normal  Neurological: She is alert. She has normal strength. No cranial nerve deficit or sensory deficit.  Skin: Skin is warm and dry. No rash noted.  Psychiatric: She has a normal mood and affect.    ED Course  Procedures (including critical care time)  Labs Reviewed  GLUCOSE, CAPILLARY - Abnormal; Notable for the following:    Glucose-Capillary 228 (*)    All other components within normal limits  URINALYSIS, ROUTINE W REFLEX MICROSCOPIC - Abnormal; Notable for the following:    APPearance CLOUDY (*)    Ketones, ur 15 (*)    Leukocytes, UA SMALL (*)    All other components within normal limits  CBC WITH DIFFERENTIAL - Abnormal; Notable for the following:    RBC 3.81 (*)    Hemoglobin  10.9 (*)    HCT 32.3 (*)    All other components within normal limits  BASIC METABOLIC PANEL - Abnormal; Notable for the following:    Sodium 133 (*)    Glucose, Bld 180 (*)    GFR calc non Af Amer 84 (*)    All other components within normal limits  URINE CULTURE  URINE MICROSCOPIC-ADD ON   No results found.   No diagnosis found.    MDM  Discussed with the patient's daughter who is in [place]. States the patient is normally 'of sound mind'. She says that staff at [facility] were concerned that she may have been self-medicating in addition to the medications that they were giving here prescribed by the staff physician. They did not mention this concern to me when I spoke with them. The patient is still confused. Labs and imaging are unremarkable. Plan for admission for further eval.    Date: 12/06/2012  Rate: 80  Rhythm: normal sinus rhythm  QRS Axis: normal  Intervals: normal  ST/T Wave abnormalities: normal  Conduction Disutrbances:none  Narrative Interpretation:   Old  EKG Reviewed: unchanged    Charles B. Bernette Mayers, MD 12/06/12 [phone]

## 2012-12-07 ENCOUNTER — Encounter (HOSPITAL_COMMUNITY): Payer: Self-pay | Admitting: Internal Medicine

## 2012-12-07 ENCOUNTER — Inpatient Hospital Stay (HOSPITAL_COMMUNITY): Payer: Medicare Other

## 2012-12-07 DIAGNOSIS — G9340 Encephalopathy, unspecified: Secondary | ICD-10-CM | POA: Diagnosis present

## 2012-12-07 DIAGNOSIS — I6350 Cerebral infarction due to unspecified occlusion or stenosis of unspecified cerebral artery: Principal | ICD-10-CM

## 2012-12-07 DIAGNOSIS — R4182 Altered mental status, unspecified: Secondary | ICD-10-CM

## 2012-12-07 DIAGNOSIS — I639 Cerebral infarction, unspecified: Secondary | ICD-10-CM | POA: Diagnosis present

## 2012-12-07 LAB — RPR: RPR Ser Ql: NONREACTIVE

## 2012-12-07 LAB — CBC
HCT: 31.3 % — ABNORMAL LOW (ref 36.0–46.0)
Hemoglobin: 10.3 g/dL — ABNORMAL LOW (ref 12.0–15.0)
MCH: 27.9 pg (ref 26.0–34.0)
MCHC: 32.9 g/dL (ref 30.0–36.0)
MCV: 84.8 fL (ref 78.0–100.0)
Platelets: 185 10*3/uL (ref 150–400)
RBC: 3.69 MIL/uL — ABNORMAL LOW (ref 3.87–5.11)
RDW: 14.1 % (ref 11.5–15.5)
WBC: 6.6 10*3/uL (ref 4.0–10.5)

## 2012-12-07 LAB — BASIC METABOLIC PANEL
BUN: 11 mg/dL (ref 6–23)
CO2: 27 mEq/L (ref 19–32)
Calcium: 9.2 mg/dL (ref 8.4–10.5)
Chloride: 103 mEq/L (ref 96–112)
Creatinine, Ser: 0.58 mg/dL (ref 0.50–1.10)
GFR calc Af Amer: >90 mL/min (ref >90)
GFR calc non Af Amer: >90 mL/min (ref >90)
Glucose, Bld: 143 mg/dL — ABNORMAL HIGH (ref 70–99)
Potassium: 3.5 mEq/L (ref 3.5–5.1)
Sodium: 139 mEq/L (ref 135–145)

## 2012-12-07 LAB — HEPATIC FUNCTION PANEL
ALT: 18 U/L (ref 0–35)
AST: 21 U/L (ref 0–37)
Albumin: 2.7 g/dL — ABNORMAL LOW (ref 3.5–5.2)
Alkaline Phosphatase: 68 U/L (ref 39–117)
Bilirubin, Direct: 0.3 mg/dL (ref 0.0–0.3)
Indirect Bilirubin: 0.8 mg/dL (ref 0.3–0.9)
Total Bilirubin: 1.1 mg/dL (ref 0.3–1.2)
Total Protein: 7 g/dL (ref 6.0–8.3)

## 2012-12-07 LAB — GLUCOSE, CAPILLARY
Glucose-Capillary: 135 mg/dL — ABNORMAL HIGH (ref 70–99)
Glucose-Capillary: 98 mg/dL (ref 70–99)
Glucose-Capillary: 105 mg/dL — ABNORMAL HIGH (ref 70–99)
Glucose-Capillary: 157 mg/dL — ABNORMAL HIGH (ref 70–99)
Glucose-Capillary: 239 mg/dL — ABNORMAL HIGH (ref 70–99)
Glucose-Capillary: 173 mg/dL — ABNORMAL HIGH (ref 70–99)

## 2012-12-07 LAB — MRSA PCR SCREENING: MRSA by PCR: NEGATIVE

## 2012-12-07 LAB — AMMONIA: Ammonia: 16 umol/L (ref 11–60)

## 2012-12-07 LAB — TSH: TSH: 1.171 u[IU]/mL (ref 0.350–4.500)

## 2012-12-07 MED ORDER — ENOXAPARIN SODIUM 40 MG/0.4ML ~~LOC~~ SOLN: 40.0000 mg | SUBCUTANEOUS | Status: DC

## 2012-12-07 MED ORDER — ONDANSETRON HCL 4 MG/2ML IJ SOLN: 4.0000 mg | Freq: Four times a day (QID) | INTRAMUSCULAR | Status: DC | PRN

## 2012-12-07 MED ORDER — PANTOPRAZOLE SODIUM 40 MG PO TBEC
40.0000 mg | DELAYED_RELEASE_TABLET | Freq: Every day | ORAL | Status: DC
  Administered 2012-12-07 – 2012-12-08 (×2): 40 mg via ORAL
  Filled 2012-12-07 (×2): qty 1

## 2012-12-07 MED ORDER — RIVAROXABAN 10 MG PO TABS
10.0000 mg | ORAL_TABLET | Freq: Every day | ORAL | Status: DC
  Administered 2012-12-07 – 2012-12-08 (×2): 10 mg via ORAL
  Filled 2012-12-07 (×4): qty 1

## 2012-12-07 MED ORDER — SIMVASTATIN 10 MG PO TABS
10.0000 mg | ORAL_TABLET | Freq: Every morning | ORAL | Status: DC
  Administered 2012-12-07 – 2012-12-08 (×2): 10 mg via ORAL
  Filled 2012-12-07 (×2): qty 1

## 2012-12-07 MED ORDER — SENNOSIDES-DOCUSATE SODIUM 8.6-50 MG PO TABS
1.0000 | ORAL_TABLET | Freq: Every evening | ORAL | Status: DC | PRN
  Filled 2012-12-07: qty 1

## 2012-12-07 MED ORDER — INSULIN ASPART 100 UNIT/ML ~~LOC~~ SOLN
0.0000 [IU] | Freq: Three times a day (TID) | SUBCUTANEOUS | Status: DC
  Administered 2012-12-07: 3 [IU] via SUBCUTANEOUS
  Administered 2012-12-07 – 2012-12-08 (×2): 2 [IU] via SUBCUTANEOUS
  Administered 2012-12-08: 3 [IU] via SUBCUTANEOUS

## 2012-12-07 MED ORDER — HYDROCODONE-ACETAMINOPHEN 5-325 MG PO TABS
1.0000 | ORAL_TABLET | ORAL | Status: DC | PRN
  Administered 2012-12-07 (×2): 1 via ORAL
  Filled 2012-12-07 (×2): qty 1

## 2012-12-07 MED ORDER — ACETAMINOPHEN 650 MG RE SUPP: 650.0000 mg | Freq: Four times a day (QID) | RECTAL | Status: DC | PRN

## 2012-12-07 MED ORDER — LORAZEPAM 2 MG/ML IJ SOLN
1.0000 mg | Freq: Once | INTRAMUSCULAR | Status: AC
  Administered 2012-12-07: 1 mg via INTRAVENOUS
  Filled 2012-12-07: qty 1

## 2012-12-07 MED ORDER — ONDANSETRON HCL 4 MG PO TABS: 4.0000 mg | ORAL_TABLET | Freq: Four times a day (QID) | ORAL | Status: DC | PRN

## 2012-12-07 MED ORDER — INSULIN GLARGINE 100 UNIT/ML ~~LOC~~ SOLN
60.0000 [IU] | Freq: Two times a day (BID) | SUBCUTANEOUS | Status: DC
  Administered 2012-12-07 – 2012-12-08 (×2): 60 [IU] via SUBCUTANEOUS

## 2012-12-07 MED ORDER — GLIMEPIRIDE 4 MG PO TABS
4.0000 mg | ORAL_TABLET | Freq: Two times a day (BID) | ORAL | Status: DC
  Administered 2012-12-07 – 2012-12-08 (×3): 4 mg via ORAL
  Filled 2012-12-07 (×6): qty 1

## 2012-12-07 MED ORDER — LORAZEPAM 1 MG PO TABS
1.0000 mg | ORAL_TABLET | Freq: Once | ORAL | Status: AC
  Administered 2012-12-07: 1 mg via ORAL
  Filled 2012-12-07: qty 1

## 2012-12-07 MED ORDER — SODIUM CHLORIDE 0.9 % IJ SOLN
3.0000 mL | Freq: Two times a day (BID) | INTRAMUSCULAR | Status: DC
  Administered 2012-12-07 – 2012-12-08 (×3): 3 mL via INTRAVENOUS

## 2012-12-07 MED ORDER — HYDROCODONE-ACETAMINOPHEN 5-325 MG PO TABS
2.0000 | ORAL_TABLET | Freq: Four times a day (QID) | ORAL | Status: DC | PRN
  Administered 2012-12-07 – 2012-12-08 (×2): 2 via ORAL
  Filled 2012-12-07 (×2): qty 2

## 2012-12-07 MED ORDER — LORATADINE 10 MG PO TABS
10.0000 mg | ORAL_TABLET | Freq: Every day | ORAL | Status: DC
  Administered 2012-12-07 – 2012-12-08 (×2): 10 mg via ORAL
  Filled 2012-12-07 (×2): qty 1

## 2012-12-07 MED ORDER — GABAPENTIN 300 MG PO CAPS
300.0000 mg | ORAL_CAPSULE | Freq: Every day | ORAL | Status: DC
  Administered 2012-12-07: 300 mg via ORAL
  Filled 2012-12-07 (×2): qty 1

## 2012-12-07 MED ORDER — LINAGLIPTIN 5 MG PO TABS
5.0000 mg | ORAL_TABLET | Freq: Every day | ORAL | Status: DC
  Administered 2012-12-07 – 2012-12-08 (×2): 5 mg via ORAL
  Filled 2012-12-07 (×2): qty 1

## 2012-12-07 MED ORDER — ACETAMINOPHEN 325 MG PO TABS: 650.0000 mg | ORAL_TABLET | Freq: Four times a day (QID) | ORAL | Status: DC | PRN

## 2012-12-07 MED ORDER — SODIUM CHLORIDE 0.9 % IV SOLN
INTRAVENOUS | Status: AC
  Administered 2012-12-07: 03:00:00 via INTRAVENOUS

## 2012-12-07 MED ORDER — LORAZEPAM 2 MG/ML IJ SOLN
0.5000 mg | Freq: Three times a day (TID) | INTRAMUSCULAR | Status: DC | PRN
  Administered 2012-12-07: 0.5 mg via INTRAVENOUS
  Filled 2012-12-07: qty 1

## 2012-12-07 MED ORDER — LORAZEPAM 2 MG/ML IJ SOLN: 1.0000 mg | Freq: Once | INTRAMUSCULAR | Status: DC

## 2012-12-07 MED ORDER — ALBUTEROL SULFATE HFA 108 (90 BASE) MCG/ACT IN AERS
2.0000 | INHALATION_SPRAY | Freq: Four times a day (QID) | RESPIRATORY_TRACT | Status: DC | PRN
  Filled 2012-12-07: qty 6.7

## 2012-12-07 NOTE — H&P (Signed)
[facility] History and Physical  Paula Steele ZOX:[id number] DOB: 09/22/1946 DOA: 12/06/2012  Referring physician: Dr. Bernette Mayers. PCP: Samuel Jester, DO  Specialists: None.  Chief Complaint: Confusion.  HPI: Paula Steele is a 67 y.o. female with history of diabetes mellitus type 2 and hyperlipidemia who was just recently transferred to rehabilitation after having right knee surgery was found to be increasingly confused. Patient's husband with whom I discussed states that she was found to be agitated and confused last afternoon and was wanting to get out of the rehabilitation. She was brought to the ER and in the ER patient was found to be having waxing and waning mental status. Patient's CT head was negative patient was not febrile. Patient just had a recent right knee surgery. Prior to surgery patient's husband states she had UTI otherwise she was having no other complaints. At this time patient has been admitted for further management.  Review of Systems: The patient denies anorexia, fever, weight loss,, vision loss, decreased hearing, hoarseness, chest pain, syncope, dyspnea on exertion, peripheral edema, balance deficits, hemoptysis, abdominal pain, melena, hematochezia, severe indigestion/heartburn, hematuria, incontinence, genital sores, muscle weakness, suspicious skin lesions, transient blindness, difficulty walking, depression, unusual weight change, abnormal bleeding, enlarged lymph nodes, angioedema, and breast masses.  Past Medical History  Diagnosis Date  . Diverticulosis of colon (without mention of hemorrhage)   . Personal history of other diseases of digestive system     OCCASIONAL ABD. PAIN  . Other chronic nonalcoholic liver disease     SEES DR Arlyce Dice-- ?SCARRING OR CIRRHOSIS PER PET SCAN 2010  . Benign neoplasm of adrenal gland   . Benign neoplasm of colon   . Colon polyp   . Diabetes mellitus without mention of complication     INSULIN AND ORAL MEDS  .  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 Hyperlipidemia   . Vitamin D deficiency   . Neck pain, chronic S/P RADIAL NECK DISSECTION--  2007    LEFT SIDE OF NECK AND SHOULDER PAIN  . Insomnia DUE TO STRESS  . Shortness of breath on exertion   . Esophageal reflux     CONTROLLED W/ PRILOSEC  . Oropharyngeal dysphagia MILD RIGHT SUPRAGLOTTIC REGION    PT STATES SHE EATS VERY SLOWLY AND CHEWS FOOD WELL  . Weakness of both legs   . Degeneration of intervertebral disc, site unspecified     LUMBAR  . Arthritis      SHOULDERS--   IS CURRENTLY HAS LEFT SHOULDER PAIN  . 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 Left arm pain     and  heaviness since radical neck surg  . Chronic meniscal tear of knee, right   . Urgency incontinence   . Blood transfusion   . Hx: UTI (urinary tract infection)   . Chronic kidney disease     bladder incontinence   . Bronchitis     10/2012- followed by ER and then PCP- Dr Charm Barges    Past Surgical History  Procedure Laterality Date  . Shoulder surgery  1990    LEFT  . Laparoscopic salpingoopherectomy  2001    RIGHT  W/ LYSIS AHESIONS  . Abdominal hysterectomy  1984    W/  LSO  . Neck mass excision  2007    LEFT  . Radical neck dissection  2007  W/ LEFT TONSILLECTOMY AND LEFT EXCISION LEFT TONGUE BASE DUE TO CANCER  . Direct laryngoscopy  2007    W/ LEFT NECK MASS EXCISION  . Tonsillectomy  01-31-2009    RIGHT (DUE TO MASS)  . Tonsillectomy  2007    LEFT W/ NECK DISSECTION DUE TO CANCER  . Cholecystectomy  15 YRS AGO  . Excision left sebacous cyst  05-14-2011 W/  LOCAL  . Cataract extraction w/ intraocular lens  implant, bilateral    . Knee arthroscopy  09/26/2011    Procedure: ARTHROSCOPY KNEE;  Surgeon: Loanne Drilling;  Location: [facility];  Service: Orthopedics;  Laterality: Right;  right knee arthroscopy with meniscal  tear  . Knee arthroscopy  03/25/2012    Procedure: ARTHROSCOPY KNEE;  Surgeon: Loanne Drilling, MD;  Location: [facility];  Service: Orthopedics;  Laterality: Right;  With Debridement of medial lateral meniscus  . Total knee arthroplasty Right 11/30/2012    Procedure: TOTAL KNEE ARTHROPLASTY;  Surgeon: Loanne Drilling, MD;  Location: [facility];  Service: Orthopedics;  Laterality: Right;   Social History:  reports that she quit smoking about 28 years ago. Her smoking use included Cigarettes. She smoked 0.00 packs per day for 15 years. She has never used smokeless tobacco. She reports that she does not drink alcohol or use illicit drugs. Presently lives in nursing home for rehabilitation. where does patient live--home, ALF, SNF? and with whom if at home? Can do her ADLs usually. Can patient participate in ADLs?  No Known Allergies  Family History  Problem Relation Age of Onset  . Thyroid cancer Brother   . Diabetes Mother   . Diabetes Brother   . Heart disease Father   . Heart disease Brother      Prior to Admission medications   Medication Sig Start Date End Date Taking? Authorizing Provider  albuterol (PROVENTIL HFA;VENTOLIN HFA) 108 (90 BASE) MCG/ACT inhaler Inhale 2 puffs into the lungs every 6 (six) hours as needed. Wheezing   Yes Historical Provider, MD  diazepam (VALIUM) 10 MG tablet Take 5 mg by mouth every 8 (eight) hours as needed for anxiety. Muscle spasms   Yes Historical Provider, MD  fexofenadine (ALLEGRA) 180 MG tablet Take 180 mg by mouth daily.   Yes Historical Provider, MD  gabapentin (NEURONTIN) 300 MG capsule Take 300 mg by mouth at bedtime.   Yes Historical Provider, MD  glimepiride (AMARYL) 4 MG tablet Take 4 mg by mouth 2 (two) times daily.    Yes Historical Provider, MD  HYDROcodone-acetaminophen (NORCO/VICODIN) 5-325 MG per tablet Take 1 tablet by mouth every 4 (four) hours as needed for pain.   Yes Historical Provider, MD  insulin glargine (LANTUS) 100 UNIT/ML injection  Inject 60 Units into the skin 2 (two) times daily. Sliding scale in the mornings   Yes Historical Provider, MD  NOVOLOG FLEXPEN 100 UNIT/ML injection Inject 12-38 Units into the skin 3 (three) times daily. PER Sliding scale 06/25/11  Yes Historical Provider, MD  omeprazole (PRILOSEC) 20 MG capsule Take 20 mg by mouth 2 (two) times daily.    Yes Historical Provider, MD  rivaroxaban (XARELTO) 10 MG TABS tablet Take 1 tablet (10 mg total) by mouth daily with breakfast. 12/03/12  Yes Loanne Drilling, MD  simvastatin (ZOCOR) 10 MG tablet Take 10 mg by mouth every morning.   Yes Historical Provider, MD  sitaGLIPtin (JANUVIA) 100 MG tablet Take 100 mg by mouth every morning.   Yes Historical Provider, MD  tiZANidine (ZANAFLEX) 4 MG tablet Take 4 mg by mouth at bedtime. As needed 06/24/11  Yes Historical Provider, MD  triamcinolone (KENALOG) 0.1 % cream Apply 1 application topically 2 (two) times daily as needed. itching 05/25/11  Yes Historical Provider, MD  Vitamin D, Ergocalciferol, (DRISDOL) 50000 UNITS CAPS Take 50,000 Units by mouth every Sunday.    Yes Historical Provider, MD  methocarbamol (ROBAXIN) 500 MG tablet Take 1 tablet (500 mg total) by mouth every 6 (six) hours as needed. 12/03/12   Loanne Drilling, MD   Physical Exam: Filed Vitals:   12/06/12 1854 12/06/12 2336  BP: 112/44 118/65  Pulse: 81 85  Temp: 98.4 F (36.9 C) 98.6 F (37 C)  TempSrc: Oral Oral  Resp: 18 20  SpO2: 93% 93%     General:  Well-nourished and built.  Eyes: Anicteric no pallor.  ENT: No discharge from ears eyes nose and mouth.  Neck: No mass felt.  Cardiovascular: S1-S2 heard.  Respiratory: No rhonchi no crepitations.  Abdomen: Soft nontender bowel sounds present.  Skin: No rash.  Musculoskeletal: Right leg is under brace.  Psychiatric: Presently patient is talking well and oriented to time place and person.  Neurologic: Moves all extremities 5 x 5.  Labs on Admission:  Basic Metabolic  Panel:  Recent Labs Lab 12/01/12 0223 12/02/12 0410 12/06/12 2110  NA 133* 135 133*  K 4.6 3.8 3.8  CL 99 99 99  CO2 26 27 28   GLUCOSE 436* 299* 180*  BUN 14 15 15   CREATININE 0.89 0.59 0.77  CALCIUM 8.7 9.3 9.1   Liver Function Tests: No results found for this basename: AST, ALT, ALKPHOS, BILITOT, PROT, ALBUMIN,  in the last 168 hours No results found for this basename: LIPASE, AMYLASE,  in the last 168 hours No results found for this basename: AMMONIA,  in the last 168 hours CBC:  Recent Labs Lab 12/01/12 0223 12/02/12 0410 12/03/12 0410 12/06/12 2110  WBC 7.3 11.7* 8.1 6.1  NEUTROABS  --   --   --  3.8  HGB 11.9* 12.7 10.9* 10.9*  HCT 35.3* 37.4 33.3* 32.3*  MCV 84.9 84.0 85.4 84.8  PLT 135* 177 139* 163   Cardiac Enzymes: No results found for this basename: CKTOTAL, CKMB, CKMBINDEX, TROPONINI,  in the last 168 hours  BNP (last 3 results) No results found for this basename: PROBNP,  in the last 8760 hours CBG:  Recent Labs Lab 12/03/12 1606 12/03/12 2124 12/04/12 0700 12/04/12 1141 12/06/12 1902  GLUCAP 157* 140* 137* 168* 228*    Radiological Exams on Admission: Ct Head Wo Contrast  12/06/2012  *RADIOLOGY REPORT*  Clinical Data: Altered mental status, weakness  CT HEAD WITHOUT CONTRAST  Technique:  Contiguous axial images were obtained from the base of the skull through the vertex without contrast.  Comparison: None.  Findings: Periventricular and subcortical white matter hypodensities are most in keeping with chronic microangiopathic change. There is no evidence for acute hemorrhage, hydrocephalus, mass lesion, or abnormal extra-axial fluid collection.  No definite CT evidence for acute infarction.  Atherosclerotic calcification noted. The visualized paranasal sinuses and mastoid air cells are predominately clear.  IMPRESSION: White matter changes are nonspecific, a finding often seen with chronic microangiopathic change.  No definite CT evidence of acute  intracranial abnormality.   Original Report Authenticated By: Jearld Lesch, M.D.     Assessment/Plan Principal Problem:   Acute encephalopathy Active Problems:   DM   Cirrhosis of liver without mention of  alcohol   1. Acute encephalopathy/delirium - the causes not clear but could be from pain medications. At this time I'm just continuing hydrocodone and will stop Robaxin and Zanaflex. Since patient has history of cirrhosis of the liver per the chart check ammonia levels along with TSH and RPR. Patient presently is afebrile. Check MRI brain. Patient will be placed on when necessary Ativan for any agitation. 2. Diabetes mellitus type 2 - continue home medications with sliding-scale coverage. 3. Hyperlipidemia - continue home medications. 4. Recent right knee surgery - no acute issues.  None. if consultant consulted, please document name and whether formally or informally consulted  Code Status: Full code. (must indicate code status--if unknown or must be presumed, indicate so) Family Communication: Discussed with patient's husband. (indicate person spoken with, if applicable, with phone number if by telephone) Disposition Plan: Admit the patient.  KAKRAKANDY,ARSHAD N. [facility] Pager [phone].  If 7PM-7AM, please contact night-coverage [email or web address] Password [facility] 12/07/2012, 12:11 AM

## 2012-12-07 NOTE — Progress Notes (Addendum)
[facility] PROGRESS NOTE  CLOEY SFERRAZZA FAO:[id number] DOB: 1945/12/06 DOA: 12/06/2012 PCP: Samuel Jester, DO  Assessment/Plan: Altered mental status Likely in the setting of acute stroke versus overuse of pain medications. Mental status improved on evaluation this morning however they still some slowing during conversation. No focal neurological weakness noted. We'll continue monitoring with neuro checks. Continue telemetry monitoring -Minimize pain medications. -Urinalysis not very convincing for UTI. Will hold antibiotics. Patient asymptomatic  Acute stroke As evident on MRI with a small left ventricular infarct. Patient has chronic small vessel disease. Does not have any focal neurological weakness on exam. She does have some slowness of speech and comprehension. We'll get stroke workup. Order 2-D echo and carotid Dopplers. Check hemoglobin A1c and lipid panel. -Patient on Pradaxa for from recent knee surgery several continue. -Allow for permissive hypertension. PT eval -Neurology consult called and will follow up with recommendation. -Will hold off on aspirin as patient on pradaxa. On low dose statin at home which I will continue  Diabetes mellitus On insulin at home. Fingersticks stable and patient has poor appetite. Holding home dose Lantus and sliding scale insulin continued. Check hemoglobin A1c  Total knee arthroplasty right knee Recent surgery one week back. Pain controlled as tolerated and avoiding sedation Physical therapy evaluation. Patient wishes to go home after hospitalization and husband informs he able to set up care at home.      DVT prophylaxis: On Pradaxa  Code Status: Full Code Family Communication: Husband at bedside Disposition Plan: Home with home health once stable   Consultants:  Neurology  Procedures:  None  Antibiotics:  None  HPI/Subjective: Patient seen and examined this morning. Admission H&P reviewed. MRI brain showing acute  small left-sided stroke. No focal neurological deficits. Mental status improved but still has some slowing.  Objective: Filed Vitals:   12/06/12 2336 12/07/12 0248 12/07/12 0252 12/07/12 0618  BP: 118/65 152/73 141/74 124/56  Pulse: 85 103  99  Temp: 98.6 F (37 C) 98.5 F (36.9 C)  99.4 F (37.4 C)  TempSrc: Oral Oral  Oral  Resp: 20 18  20   Height:  5\' 9"  (1.753 m)    Weight:  117.799 kg (259 lb 11.2 oz)    SpO2: 93% 100%  97%    Intake/Output Summary (Last 24 hours) at 12/07/12 1512 Last data filed at 12/07/12 1150  Gross per 24 hour  Intake    120 ml  Output   1200 ml  Net  -1080 ml   Filed Weights   12/07/12 0248  Weight: 117.799 kg (259 lb 11.2 oz)    Exam:   General:  Elderly female lying in bed in no acute distress  HEENT: No pallor, moist oral mucosa  Cardiovascular: Normal S1 and S2, no murmurs rub or gallop  Respiratory: Clear to auscultation bilaterally no added sounds  Abdomen: Rough, nontender, nondistended, bowel sounds present  Extremities: Right knee brace, no focal deficit, no edema  CNS: AAO x3 has some slowness of speech, no focal deficits  Data Reviewed: Basic Metabolic Panel:  Recent Labs Lab 12/01/12 0223 12/02/12 0410 12/06/12 2110 12/07/12 0515  NA 133* 135 133* 139  K 4.6 3.8 3.8 3.5  CL 99 99 99 103  CO2 26 27 28 27   GLUCOSE 436* 299* 180* 143*  BUN 14 15 15 11   CREATININE 0.89 0.59 0.77 0.58  CALCIUM 8.7 9.3 9.1 9.2   Liver Function Tests:  Recent Labs Lab 12/07/12 0516  AST 21  ALT  18  ALKPHOS 68  BILITOT 1.1  PROT 7.0  ALBUMIN 2.7*   No results found for this basename: LIPASE, AMYLASE,  in the last 168 hours  Recent Labs Lab 12/07/12 0516  AMMONIA 16   CBC:  Recent Labs Lab 12/01/12 0223 12/02/12 0410 12/03/12 0410 12/06/12 2110 12/07/12 0515  WBC 7.3 11.7* 8.1 6.1 6.6  NEUTROABS  --   --   --  3.8  --   HGB 11.9* 12.7 10.9* 10.9* 10.3*  HCT 35.3* 37.4 33.3* 32.3* 31.3*  MCV 84.9 84.0 85.4  84.8 84.8  PLT 135* 177 139* 163 185   Cardiac Enzymes: No results found for this basename: CKTOTAL, CKMB, CKMBINDEX, TROPONINI,  in the last 168 hours BNP (last 3 results) No results found for this basename: PROBNP,  in the last 8760 hours CBG:  Recent Labs Lab 12/06/12 1902 12/07/12 0306 12/07/12 0744 12/07/12 0954 12/07/12 1329  GLUCAP 228* 135* 98 105* 157*    Recent Results (from the past 240 hour(s))  MRSA PCR SCREENING     Status: None   Collection Time    12/07/12  3:34 AM      Result Value Range Status   MRSA by PCR NEGATIVE  NEGATIVE Final   Comment:            The GeneXpert MRSA Assay (FDA     approved for NASAL specimens     only), is one component of a     comprehensive MRSA colonization     surveillance program. It is not     intended to diagnose MRSA     infection nor to guide or     monitor treatment for     MRSA infections.     Studies: Ct Head Wo Contrast  12/06/2012  *RADIOLOGY REPORT*  Clinical Data: Altered mental status, weakness  CT HEAD WITHOUT CONTRAST  Technique:  Contiguous axial images were obtained from the base of the skull through the vertex without contrast.  Comparison: None.  Findings: Periventricular and subcortical white matter hypodensities are most in keeping with chronic microangiopathic change. There is no evidence for acute hemorrhage, hydrocephalus, mass lesion, or abnormal extra-axial fluid collection.  No definite CT evidence for acute infarction.  Atherosclerotic calcification noted. The visualized paranasal sinuses and mastoid air cells are predominately clear.  IMPRESSION: White matter changes are nonspecific, a finding often seen with chronic microangiopathic change.  No definite CT evidence of acute intracranial abnormality.   Original Report Authenticated By: Jearld Lesch, M.D.    Mr Brain Wo Contrast  12/07/2012  *RADIOLOGY REPORT*  Clinical Data: Altered mental status  MRI HEAD WITHOUT CONTRAST  Technique:  Multiplanar,  multiecho pulse sequences of the brain and surrounding structures were obtained according to standard protocol without intravenous contrast.  Comparison: Head CT 12/06/2012  Findings: There is a sub-centimeter acute infarction affecting the white matter adjacent to the atrium of the left lateral ventricle. There is no swelling or mass effect.  There is a punctate focus of susceptibility artifact suggesting minor hemorrhagic products.  No actual hematoma.  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  I do not see evidence that there has been previous hemorrhage.  Presumably, this is incidental to the current presentation.  There are extensive chronic small vessel changes throughout the pons.  The cerebral hemispheres show old small vessel changes affecting the basal ganglia, thalami and throughout  the hemispheric deep white matter.  No cortical or large vessel territory infarction.  No mass lesion, hydrocephalus or extra-axial collection.  No pituitary mass.  Paranasal sinuses are clear. There is some fluid in the left mastoid air cells.  IMPRESSION: Sub-centimeter acute infarction affecting the white matter adjacent to the atrium of the left lateral ventricle.  Tiny focus of hemosiderin in the region suggesting minor blood products. Certainly there is no measurable hematoma.  Extensive chronic small vessel changes elsewhere throughout the brain.  Fluid in the mastoid air cells on the left.  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  This is probably not acutely relevant. There is no sign of previous hemorrhage related to that.   Original Report Authenticated By: Paulina Fusi, M.D.     Scheduled Meds: . gabapentin  300 mg Oral QHS  . glimepiride  4 mg Oral BID AC  . insulin aspart  0-9 Units  Subcutaneous TID WC  . insulin glargine  60 Units Subcutaneous BID  . linagliptin  5 mg Oral Daily  . loratadine  10 mg Oral Daily  . pantoprazole  40 mg Oral Daily  . rivaroxaban  10 mg Oral Q breakfast  . simvastatin  10 mg Oral q morning - 10a  . sodium chloride  3 mL Intravenous Q12H   Continuous Infusions: . sodium chloride 50 mL/hr at 12/07/12 1610      Time spent: 25 minutes    Jarris Kortz  [facility] Pager [phone] If 8PM-8AM, please contact night-coverage at [email or web address], password [facility] 12/07/2012, 3:12 PM  LOS: 1 day

## 2012-12-07 NOTE — Progress Notes (Signed)
Echocardiogram 2D Echocardiogram has been performed.  Paula Steele 12/07/2012, 3:17 PM

## 2012-12-08 LAB — LIPID PANEL
Cholesterol: 104 mg/dL (ref 0–200)
HDL: 23 mg/dL — ABNORMAL LOW (ref >39)
LDL Cholesterol: 61 mg/dL (ref 0–99)
Total CHOL/HDL Ratio: 4.5 RATIO
Triglycerides: 100 mg/dL (ref <150)
VLDL: 20 mg/dL (ref 0–40)

## 2012-12-08 LAB — HEMOGLOBIN A1C
Hgb A1c MFr Bld: 9.2 % — ABNORMAL HIGH (ref <5.7)
Mean Plasma Glucose: 217 mg/dL — ABNORMAL HIGH (ref <117)

## 2012-12-08 LAB — GLUCOSE, CAPILLARY
Glucose-Capillary: 152 mg/dL — ABNORMAL HIGH (ref 70–99)
Glucose-Capillary: 220 mg/dL — ABNORMAL HIGH (ref 70–99)

## 2012-12-08 MED ORDER — HYDROCODONE-ACETAMINOPHEN 5-325 MG PO TABS: 1.0000 | ORAL_TABLET | Freq: Four times a day (QID) | ORAL | Status: DC | PRN

## 2012-12-08 MED ORDER — DIAZEPAM 10 MG PO TABS: 5.0000 mg | ORAL_TABLET | Freq: Two times a day (BID) | ORAL | Status: DC | PRN

## 2012-12-08 NOTE — Progress Notes (Signed)
Clinical Social Work Department BRIEF PSYCHOSOCIAL ASSESSMENT 12/08/2012  Patient:  Paula Steele, Paula Steele     Account Number:  [id number]     Admit date:  12/06/2012  Clinical Social Worker:  Hattie Perch  Date/Time:  12/07/2012 12:00 M  Referred by:  Physician  Date Referred:  12/07/2012 Referred for  SNF Placement   Other Referral:   Interview type:  Patient Other interview type:   spouse    PSYCHOSOCIAL DATA Living Status:  FACILITY Admitted from facility:  [facility] Level of care:  Skilled Nursing Facility Primary support name:  Helayne Metsker Primary support relationship to patient:  SPOUSE Degree of support available:   fair    CURRENT CONCERNS Current Concerns  Post-Acute Placement   Other Concerns:    SOCIAL WORK ASSESSMENT / PLAN CSW met with patient and husband at bedside. patient is oriented X3. patient was at [facility] for snf but is planning on going home.   Assessment/plan status:   Other assessment/ plan:   Information/referral to community resources:    PATIENT'S/FAMILY'S RESPONSE TO PLAN OF CARE: agreeable to home with home health.

## 2012-12-08 NOTE — Discharge Summary (Addendum)
Physician Discharge Summary  JANIKA JEDLICKA RUE:[id number] DOB: 1946-03-03 DOA: 12/06/2012  PCP: Samuel Jester, DO  Admit date: 12/06/2012 Discharge date: 12/08/2012  Time spent: 40 minutes minutes  Recommendations for Outpatient Follow-up:  1. Home with home health, PT, [place] and [place] 2. Followup with PCP in one week  3. Follow up with Dr Pearlean Brownie  with neurology in 2 months  Discharge Diagnoses:  Principal Problem:   Acute encephalopathy  Active Problems:   CVA (cerebral infarction)   DM   CAD   Cirrhosis of liver without mention of alcohol   OA (osteoarthritis) of knee, with recent THA   Discharge Condition: Fair  Diet recommendation: Diabetic  Filed Weights   12/07/12 0248 12/08/12 0658  Weight: 117.799 kg (259 lb 11.2 oz) 115.8 kg (255 lb 4.7 oz)    History of present illness:  Please refer to admission H&P for details but in brief 67 y.o. female with history of diabetes mellitus type 2 and hyperlipidemia who was just recently transferred to rehabilitation after having right knee surgery was found to be increasingly confused. As per patient's husband she was found to be agitated and confused last afternoon and was wanting to get out of the rehabilitation. She was brought to the ER and in the ER patient was found to be having waxing and waning mental status. Patient's CT head was negative patient was not febrile. Patient just had a recent right knee surgery   Hospital Course:  Altered mental status  Likely in the setting of acute stroke versus along with overuse of pain medications.  Mental status improved  Progressively.  No other focal neurological weakness noted. A 2-D echo and carotid Dopplers unremarkable. Stable on telemetry. Have tried to minimize pain medications.  - also on Valium 5 mg 3 times a day as needed for anxiety which I will space out to every 2 hours only.  Acute stroke  Head CT on admission was unremarkable. MRI of the brain showed small left  ventricular infarct. Patient has chronic small vessel disease. Does not have any focal neurological weakness on exam. She does have some slowness of speech and comprehension on exam which has markedly resolved now. 2-D echo and carotid Dopplers normal.-Patient on xeralto following a recent knee surgery. -Will allow permissive hypertension. -Lipid panel within normal limits. Appreciate neurology consultation. Recommend to continue patient on xeralto and  not to start aspirin or Plavix while he is on it. Once patient is off xeralto she should be on aspirin. -Follow Up with neurology as outpatient in 2 months.  Diabetes mellitus  On significant dose of insulin at home. Hemoglobin A1c of 9.2. Patient informs not taking her insulin regularly given poor appetite. Recommended her to be on her usual dose of in and follow up with her PCP  Total knee arthroplasty right knee  Recent surgery one week back. Pain controlled as tolerated and avoiding sedation  Physical therapy evaluation. Patient wishes to go home after hospitalization and husband informs he able to set up care at home.    Code Status: Full Code  Family Communication: Husband at bedside  Disposition Plan: Home with home health PT/OT and RN  Consultants:  Neurology     Procedures:  MRI brain, 2-D echo, carotid Dopplers  Consultations:  Neurology  Discharge Exam: Filed Vitals:   12/08/12 0615 12/08/12 0658 12/08/12 0830 12/08/12 1342  BP: 144/72  167/71 144/65  Pulse: 89  94 90  Temp: 98.1 F (36.7 C)  98.3 F (  36.8 C) 98.6 F (37 C)  TempSrc: Oral  Oral Oral  Resp: 18  18 18   Height:      Weight:  115.8 kg (255 lb 4.7 oz)    SpO2: 96%  99% 96%    General: Elderly obese female lying in bed in no acute distress HEENT: No pallor, moist oral mucosa Cardiovascular: Normal S1 and S2 no murmurs rub or gallop Respiratory: Her to auscultation bilaterally no added sounds Abdomen: Soft, nontender, nondistended, bowel sounds  present Extremities: Warm, no edema,  brace over it ankle    Discharge Instructions     Medication List    TAKE these medications       albuterol 108 (90 BASE) MCG/ACT inhaler  Commonly known as:  PROVENTIL HFA;VENTOLIN HFA  Inhale 2 puffs into the lungs every 6 (six) hours as needed. Wheezing     diazepam 10 MG tablet  Commonly known as:  VALIUM  Take 5 mg by mouth every 12  hours as needed for anxiety. Muscle spasms     fexofenadine 180 MG tablet  Commonly known as:  ALLEGRA  Take 180 mg by mouth daily.     gabapentin 300 MG capsule  Commonly known as:  NEURONTIN  Take 300 mg by mouth at bedtime.     glimepiride 4 MG tablet  Commonly known as:  AMARYL  Take 4 mg by mouth 2 (two) times daily.     HYDROcodone-acetaminophen 5-325 MG per tablet  Commonly known as:  NORCO/VICODIN  Take 1 tablet by mouth every 6 (six) hours as needed for pain.     LANTUS 100 UNIT/ML injection  Generic drug:  insulin glargine  Inject 60 Units into the skin 2 (two) times daily. Sliding scale in the mornings     methocarbamol 500 MG tablet  Commonly known as:  ROBAXIN  Take 1 tablet (500 mg total) by mouth every 6 (six) hours as needed.     NOVOLOG FLEXPEN 100 UNIT/ML injection  Generic drug:  insulin aspart  Inject 12-38 Units into the skin 3 (three) times daily. PER Sliding scale     omeprazole 20 MG capsule  Commonly known as:  PRILOSEC  Take 20 mg by mouth 2 (two) times daily.     rivaroxaban 10 MG Tabs tablet  Commonly known as:  XARELTO  Take 1 tablet (10 mg total) by mouth daily with breakfast.     simvastatin 10 MG tablet  Commonly known as:  ZOCOR  Take 10 mg by mouth every morning.     sitaGLIPtin 100 MG tablet  Commonly known as:  JANUVIA  Take 100 mg by mouth every morning.     tiZANidine 4 MG tablet  Commonly known as:  ZANAFLEX  Take 4 mg by mouth at bedtime. As needed     triamcinolone cream 0.1 %  Commonly known as:  KENALOG  Apply 1 application  topically 2 (two) times daily as needed. itching     Vitamin D (Ergocalciferol) 50000 UNITS Caps  Commonly known as:  DRISDOL  Take 50,000 Units by mouth every Sunday.           Follow-up Information   Follow up with BUTLER, CYNTHIA, DO In 1 week.   Contact information:   [address] [place] [address] [phone]       Follow up with Gates Rigg, MD. Schedule an appointment as soon as possible for a visit in 2 months.   Contact information:   [address], [address] [facility] [place] [address] [phone]        The results of significant diagnostics from this hospitalization (including imaging, microbiology, ancillary and laboratory) are listed below for reference.    Significant Diagnostic Studies: Dg Chest 2 View  12/07/2012  *RADIOLOGY REPORT*  Clinical Data: Stroke  CHEST - 2 VIEW  Comparison: 11/24/2012  Findings: Decreased lung volume with mild atelectasis in the lung bases.  Cardiac enlargement without heart failure.  Negative for pneumonia or effusion.  IMPRESSION: Hypoventilation with bibasilar atelectasis.   Original Report Authenticated By: Janeece Riggers, M.D.    Dg Chest 2 View  11/24/2012  *RADIOLOGY REPORT*  Clinical Data: History of recent bronchitis, preop for right knee surgery  CHEST - 2 VIEW  Comparison: Chest x-ray of 10/22/2012  Findings: No active infiltrate or effusion is seen.  The heart is mildly enlarged and stable.  No acute skeletal abnormality is seen.  IMPRESSION: No active lung disease.  Stable mild cardiomegaly.   Original Report Authenticated By: Dwyane Dee, M.D.    Ct Head Wo Contrast  12/06/2012  *RADIOLOGY REPORT*  Clinical Data: Altered mental status, weakness  CT HEAD WITHOUT CONTRAST  Technique:  Contiguous axial images were obtained from the base of the skull through the vertex without contrast.  Comparison: None.  Findings: Periventricular and subcortical white matter hypodensities are most in  keeping with chronic microangiopathic change. There is no evidence for acute hemorrhage, hydrocephalus, mass lesion, or abnormal extra-axial fluid collection.  No definite CT evidence for acute infarction.  Atherosclerotic calcification noted. The visualized paranasal sinuses and mastoid air cells are predominately clear.  IMPRESSION: White matter changes are nonspecific, a finding often seen with chronic microangiopathic change.  No definite CT evidence of acute intracranial abnormality.   Original Report Authenticated By: Jearld Lesch, M.D.    Mr Brain Wo Contrast  12/07/2012  *RADIOLOGY REPORT*  Clinical Data: Altered mental status  MRI HEAD WITHOUT CONTRAST  Technique:  Multiplanar, multiecho pulse sequences of the brain and surrounding structures were obtained according to standard protocol without intravenous contrast.  Comparison: Head CT 12/06/2012  Findings: There is a sub-centimeter acute infarction affecting the white matter adjacent to the atrium of the left lateral ventricle. There is no swelling or mass effect.  There is a punctate focus of susceptibility artifact suggesting minor hemorrhagic products.  No actual hematoma.  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  I do not see evidence that there has been previous hemorrhage.  Presumably, this is incidental to the current presentation.  There are extensive chronic small vessel changes throughout the pons.  The cerebral hemispheres show old small vessel changes affecting the basal ganglia, thalami and throughout the hemispheric deep white matter.  No cortical or large vessel territory infarction.  No mass lesion, hydrocephalus or extra-axial collection.  No pituitary mass.  Paranasal sinuses are clear. There is some fluid in the left mastoid air cells.  IMPRESSION: Sub-centimeter acute infarction affecting the white matter adjacent to  the atrium of the left lateral ventricle.  Tiny focus of hemosiderin in the region suggesting minor blood products. Certainly there is no measurable hematoma.  Extensive chronic small vessel changes elsewhere throughout the brain.  Fluid in the mastoid air cells on the left.  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  This is probably not acutely relevant. There is no sign of previous hemorrhage related to that.   Original Report Authenticated By: Paulina Fusi, M.D.     Microbiology: Recent Results (from the past 240 hour(s))  URINE CULTURE     Status: None   Collection Time    12/06/12  7:34 PM      Result Value Range Status   Specimen Description URINE, CLEAN CATCH   Final   Special Requests NONE   Final   Culture  Setup Time 12/07/2012 02:54   Final   Colony Count PENDING   Incomplete   Culture Culture reincubated for better growth   Final   Report Status PENDING   Incomplete  MRSA PCR SCREENING     Status: None   Collection Time    12/07/12  3:34 AM      Result Value Range Status   MRSA by PCR NEGATIVE  NEGATIVE Final   Comment:            The GeneXpert MRSA Assay (FDA     approved for NASAL specimens     only), is one component of a     comprehensive MRSA colonization     surveillance program. It is not     intended to diagnose MRSA     infection nor to guide or     monitor treatment for     MRSA infections.     Labs: Basic Metabolic Panel:  Recent Labs Lab 12/02/12 0410 12/06/12 2110 12/07/12 0515  NA 135 133* 139  K 3.8 3.8 3.5  CL 99 99 103  CO2 27 28 27   GLUCOSE 299* 180* 143*  BUN 15 15 11   CREATININE 0.59 0.77 0.58  CALCIUM 9.3 9.1 9.2   Liver Function Tests:  Recent Labs Lab 12/07/12 0516  AST 21  ALT 18  ALKPHOS 68  BILITOT 1.1  PROT 7.0  ALBUMIN 2.7*   No results found for this basename: LIPASE, AMYLASE,  in the last 168  hours  Recent Labs Lab 12/07/12 0516  AMMONIA 16   CBC:  Recent Labs Lab 12/02/12 0410 12/03/12 0410 12/06/12 2110 12/07/12 0515  WBC 11.7* 8.1 6.1 6.6  NEUTROABS  --   --  3.8  --   HGB 12.7 10.9* 10.9* 10.3*  HCT 37.4 33.3* 32.3* 31.3*  MCV 84.0 85.4 84.8 84.8  PLT 177 139* 163 185   Cardiac Enzymes: No results found for this basename: CKTOTAL, CKMB, CKMBINDEX, TROPONINI,  in the last 168 hours BNP: BNP (last 3 results) No results found for this basename: PROBNP,  in the last 8760 hours CBG:  Recent Labs Lab 12/07/12 1329 12/07/12 1714 12/07/12 2139 12/08/12 0806 12/08/12 1144  GLUCAP 157* 239* 173* 152* 220*       Signed:  Anadelia Kintz  [facility] 12/08/2012, 3:30 PM

## 2012-12-08 NOTE — Progress Notes (Signed)
Addendum: Husband feels that patient is at baseline.  Echocardiogram performed and unremarkable.  Carotid doppler pending.    Plan: 1.  Patient on Xarelto.  Once discontinued would place on an aspirin a day 2.  Will follow up carotid doppler results. 3.  Patient to follow up with neurology as an outpatient    Thana Farr, MD [facility] [phone]

## 2012-12-08 NOTE — Progress Notes (Signed)
*  PRELIMINARY RESULTS* Vascular Ultrasound Carotid Duplex (Doppler) has been completed.  Preliminary findings: Bilateral:  No evidence of hemodynamically significant internal carotid artery stenosis.   Vertebral artery flow is antegrade.      Farrel Demark, RDMS, RVT  12/08/2012, 10:37 AM

## 2012-12-08 NOTE — Care Management Note (Signed)
    Page 1 of 1   12/08/2012     3:19:40 PM   CARE MANAGEMENT NOTE 12/08/2012  Patient:  Paula Steele, Paula Steele   Account Number:  [id number]  Date Initiated:  12/08/2012  Documentation initiated by:  Lanier Clam  Subjective/Objective Assessment:   ADMITTED W/AMS.ACUTE ENCEPHALOPATHY.READMIT.     Action/Plan:   FROM SNF.   Anticipated DC Date:  12/08/2012   Anticipated DC Plan:  HOME W HOME HEALTH SERVICES      DC Planning Services  CM consult      Choice offered to / List presented to:  C-1 Patient        HH arranged  HH-1 RN  HH-2 PT  HH-3 OT      [facility] agency  [facility]   Status of service:  Completed, signed off Medicare Important Message given?   (If response is "NO", the following Medicare IM given date fields will be blank) Date Medicare IM given:   Date Additional Medicare IM given:    Discharge Disposition:  HOME W HOME HEALTH SERVICES  Per UR Regulation:  Reviewed for med. necessity/level of care/duration of stay  If discussed at Long Length of Stay Meetings, dates discussed:    Comments:  12/07/12 [facility] RN,BSN NCM [phone] PATIENT DECLINED SNF.PATIENT/SPOUSE PREFER HOME [facility](REP) AWARE OF REFERRAL & D/C.RECOMMEND HHRN/PT/OT.PATIENT HAS RW,W/C,& HANDICAPPED BATHROOM,MAY ALREADY HAVE 3N1,WILL CHECK @ HOME.

## 2012-12-08 NOTE — Evaluation (Signed)
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  Pt admitted from SNF since R TKR a week ago.  Pt and spouse wish for pt to d/c home from hospital and not SNF.  Pt would benefit from acute PT services in order to improve indepedence with transfers and ambulation by increasing strength and ROM of R knee to prepare for d/c home.  Recommend 24/7 assist at home other than spouse since spouse reports "bad" back sometimes requiring power w/c.    PT Assessment  Patient needs continued PT services    Follow Up Recommendations  Home health PT;Supervision/Assistance - 24 hour    Does the patient have the potential to tolerate intense rehabilitation      Barriers to Discharge        Equipment Recommendations  Other (comment) (BSC)    Recommendations for Other Services     Frequency Min 3X/week    Precautions / Restrictions Precautions Precautions: Knee;Fall Required Braces or Orthoses: Knee Immobilizer - Right Knee Immobilizer - Right: Discontinue once straight leg raise with < 10 degree lag;On when out of bed or walking Restrictions Weight Bearing Restrictions: No Other Position/Activity Restrictions: WBAT   Pertinent Vitals/Pain N/a     Mobility  Bed Mobility Bed Mobility: Not assessed Transfers Transfers: Sit to Stand;Stand to Sit Sit to Stand: 4: Min guard;With upper extremity assist;From chair/3-in-1 Stand to Sit: To chair/3-in-1;4: Min guard Details for Transfer Assistance: verbal cues for backing up to sitting surface and hand placement Ambulation/Gait Ambulation/Gait Assistance: 4: Min guard Ambulation Distance (Feet): 60 Feet Assistive device: Rolling walker Ambulation/Gait Assistance Details: very slow gait, verbal cues  for RW distance Gait Pattern: Step-to pattern;Antalgic    Exercises     PT Diagnosis: Difficulty walking  PT Problem List: Decreased strength;Decreased range of motion;Decreased mobility;Decreased safety awareness;Decreased knowledge of use of DME PT Treatment Interventions: DME instruction;Gait training;Functional mobility training;Therapeutic activities;Therapeutic exercise;Patient/family education   PT Goals Acute Rehab PT Goals PT Goal Formulation: With patient Time For Goal Achievement: 12/22/12 Potential to Achieve Goals: Good Pt will go Supine/Side to Sit: with modified independence PT Goal: Supine/Side to Sit - Progress: Goal set today Pt will go Sit to Supine/Side: with modified independence PT Goal: Sit to Supine/Side - Progress: Goal set today Pt will go Sit to Stand: with modified independence PT Goal: Sit to Stand - Progress: Goal set today Pt will go Stand to Sit: with modified independence PT Goal: Stand to Sit - Progress: Goal set today Pt will Ambulate: 51 - 150 feet;with supervision;with least restrictive assistive device PT Goal: Ambulate - Progress: Goal set today Pt will Perform Home Exercise Program: with supervision, verbal cues required/provided PT Goal: Perform Home Exercise Program - Progress: Goal set today  Visit Information  Last PT Received On: 12/08/12 Assistance Needed: +1    Subjective Data  Subjective: I have a son about your age.   Prior Functioning  Home Living Lives With: Spouse Type of Home: House Home Access: Ramped entrance Home Layout: One level Home Adaptive Equipment: Walker - rolling Additional Comments: Pt was at SNF for rehab after TKR but pt and spouse report pt will d/c home.  Spouse states they lent their [facility] to family. Prior Function Level of Independence: Needs assistance Needs Assistance:  (min A  for adls) Communication Communication: No difficulties    Cognition  Cognition Overall Cognitive Status: Impaired Area  of Impairment: Safety/judgement Arousal/Alertness: Awake/alert Orientation Level: Appears intact for tasks assessed Behavior During Session: [facility] for tasks performed Safety/Judgement: Decreased awareness of safety precautions Cognition - Other Comments: Pt calls out for assist to bathroom, however safety cues needed for transfers    Extremity/Trunk Assessment Right Upper Extremity Assessment RUE ROM/Strength/Tone: [facility] for tasks assessed Left Upper Extremity Assessment LUE ROM/Strength/Tone: [facility] for tasks assessed Right Lower Extremity Assessment RLE ROM/Strength/Tone: Deficits RLE ROM/Strength/Tone Deficits: pt unable to perform SLR and maintained KI Left Lower Extremity Assessment LLE ROM/Strength/Tone: WFL for tasks assessed   Balance    End of Session PT - End of Session Equipment Utilized During Treatment: Right knee immobilizer Activity Tolerance: Patient tolerated treatment well Patient left: in bed;with call bell/phone within reach;with family/visitor present;Other (comment) (sitting EOB with case manager) Nurse Communication: Mobility status  GP     Tiki Tucciarone,KATHrine E 12/08/2012, 2:48 PM  Zenovia Jarred, PT, DPT 12/08/2012 Pager: [phone]

## 2012-12-08 NOTE — Evaluation (Signed)
Occupational Therapy Evaluation Patient Details Name: Paula Steele MRN: [id number] DOB: [birth year] Today's Date: 12/08/2012 Time: 4098-1191 OT Time Calculation (min): 34 min  OT Assessment / Plan / Recommendation Clinical Impression  This 67 year old female who is s/p R TKA last week, was readmitted from snf with acute encephalopathy.  She is appropriate for skilled OT to increase safety and independence with adls.      OT Assessment  Patient needs continued OT Services    Follow Up Recommendations  SNF    Barriers to Discharge      Equipment Recommendations  3 in 1 bedside comode    Recommendations for Other Services    Frequency  Min 2X/week    Precautions / Restrictions Precautions Precautions: Knee;Fall Required Braces or Orthoses: Knee Immobilizer - Right Knee Immobilizer - Right: Discontinue once straight leg raise with < 10 degree lag;On when out of bed or walking Restrictions Other Position/Activity Restrictions: WBAT   Pertinent Vitals/Pain No pain    ADL  Grooming: Simulated;Set up Where Assessed - Grooming: Supported sitting Upper Body Bathing: Performed;Set up Where Assessed - Upper Body Bathing: Supported sitting Lower Body Bathing: Performed;Minimal assistance Where Assessed - Lower Body Bathing: Supported sit to stand Upper Body Dressing: Performed;Minimal assistance (tele lines) Where Assessed - Upper Body Dressing: Supported sitting Lower Body Dressing: Performed;Minimal assistance Where Assessed - Lower Body Dressing: Supported sit to stand Toileting - Architect and Hygiene: Simulated;Minimal assistance Where Assessed - Engineer, mining and Hygiene: Sit to stand from 3-in-1 or toilet Equipment Used: Rolling walker Transfers/Ambulation Related to ADLs: Pt talks a lot and distracts self.  decreased attention amount of water on washcloth, powder on floor and needs assistance to clean up for safety.  Mod cues to keep one hand  on walker.  Pt did lose balance once because of water on floor; therapist assisted but unable to fully right--assisted to chair behind her.  Needs A x 2 for safety during transfers ADL Comments: decreased safety during adls    OT Diagnosis: Generalized weakness  OT Problem List: Decreased strength;Decreased activity tolerance;Impaired balance (sitting and/or standing);Decreased cognition;Decreased safety awareness;Decreased knowledge of precautions OT Treatment Interventions: Self-care/ADL training;DME and/or AE instruction;Patient/family education;Cognitive remediation/compensation;Balance training   OT Goals Acute Rehab OT Goals OT Goal Formulation: With patient Time For Goal Achievement: 12/22/12 Potential to Achieve Goals: Good ADL Goals Pt Will Perform Lower Body Bathing: with supervision;Sit to stand from chair ADL Goal: Lower Body Bathing - Progress: Goal set today Pt Will Perform Lower Body Dressing: with supervision;Sit to stand from chair ADL Goal: Lower Body Dressing - Progress: Goal set today Pt Will Transfer to Toilet: with supervision;Ambulation;3-in-1 (and complete all aspects of toileting) ADL Goal: Toilet Transfer - Progress: Goal set today Miscellaneous OT Goals Miscellaneous OT Goal #1: Pt will demonstrate good safety awareness during adls but keeping one hand on walker OT Goal: Miscellaneous Goal #1 - Progress: Goal set today  Visit Information  Last OT Received On: 12/08/12 Assistance Needed: +2 (safety for transfers/ambulation)    Subjective Data  Subjective: would you wash my back Patient Stated Goal: home   Prior Functioning     Home Living Additional Comments: was at snf for rehab Prior Function Level of Independence: Needs assistance Needs Assistance:  (min A for adls) Communication Communication: No difficulties         Vision/Perception     Cognition  Cognition Overall Cognitive Status: Impaired Area of Impairment:  Safety/judgement Arousal/Alertness: Awake/alert Orientation Level: Appears intact  for tasks assessed Behavior During Session: [facility] for tasks performed Safety/Judgement: Decreased awareness of safety precautions;Decreased awareness of need for assistance Cognition - Other Comments: pt also talks a lot:  cues to attend to task    Extremity/Trunk Assessment Right Upper Extremity Assessment RUE ROM/Strength/Tone: [facility] for tasks assessed Left Upper Extremity Assessment LUE ROM/Strength/Tone: [facility] for tasks assessed     Mobility Transfers Sit to Stand: 4: Min guard;From chair/3-in-1;With armrests Details for Transfer Assistance: cues for hand placement     Exercise     Balance     End of Session OT - End of Session Activity Tolerance: Patient tolerated treatment well Patient left: in chair;with call bell/phone within reach;with chair alarm set  GO     Kajol Crispen 12/08/2012, 12:23 PM Marica Otter, OTR/L [phone] 12/08/2012

## 2012-12-08 NOTE — Consult Note (Signed)
NEURO HOSPITALIST CONSULT NOTE    Reason for Consult: altered mental status, stroke.  HPI:                                                                                                                                          Paula Steele is an 67 y.o. female with a past medical history significant for diabetes, hyperlipidemia, recent right knee surgery, who was found increasingly confused after knee surgery. Review of her clinical records indicate that her husband found her " confused, agitated, and wanting to get out of rehabilitation". She has been getting narcotics after knee surgery. Upon arrival to the ED she was described as having " waxing and waning mental status" but no definitive focal findings on neurological examination. No metabolic or infectious cause for her confusion was identified, but MRI-DWI showed a sub-centimeter acute infarction affecting the white matter adjacent to the atrium of the left lateral ventricle. TTE was unimpressive. Carotid ultrasound results pending.    Past Medical History  Diagnosis Date  . Diverticulosis of colon (without mention of hemorrhage)   . Personal history of other diseases of digestive system     OCCASIONAL ABD. PAIN  . Other chronic nonalcoholic liver disease     SEES DR Arlyce Dice-- ?SCARRING OR CIRRHOSIS PER PET SCAN 2010  . Benign neoplasm of adrenal gland   . Benign neoplasm of colon   . Colon polyp   . Diabetes mellitus without mention of complication     INSULIN AND ORAL MEDS  . 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 Hyperlipidemia   . Vitamin D deficiency   . Neck pain, chronic S/P RADIAL NECK DISSECTION--  2007    LEFT SIDE OF NECK AND SHOULDER PAIN  . Insomnia DUE TO STRESS  . Shortness of breath on exertion   . Esophageal reflux     CONTROLLED W/ PRILOSEC  . Oropharyngeal dysphagia MILD RIGHT SUPRAGLOTTIC REGION    PT STATES SHE EATS VERY SLOWLY AND CHEWS FOOD WELL  . Weakness of both legs   .  Degeneration of intervertebral disc, site unspecified     LUMBAR  . Arthritis      SHOULDERS--   IS CURRENTLY HAS LEFT SHOULDER PAIN  . 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 Left arm pain     and  heaviness since radical neck surg  . Chronic meniscal tear of knee, right   . Urgency incontinence   . Blood transfusion   . Hx: UTI (urinary tract infection)   . Chronic kidney disease     bladder incontinence   . Bronchitis  10/2012- followed by ER and then PCP- Dr Charm Barges     Past Surgical History  Procedure Laterality Date  . Shoulder surgery  1990    LEFT  . Laparoscopic salpingoopherectomy  2001    RIGHT  W/ LYSIS AHESIONS  . Abdominal hysterectomy  1984    W/  LSO  . Neck mass excision  2007    LEFT  . Radical neck dissection  2007    W/ LEFT TONSILLECTOMY AND LEFT EXCISION LEFT TONGUE BASE DUE TO CANCER  . Direct laryngoscopy  2007    W/ LEFT NECK MASS EXCISION  . Tonsillectomy  01-31-2009    RIGHT (DUE TO MASS)  . Tonsillectomy  2007    LEFT W/ NECK DISSECTION DUE TO CANCER  . Cholecystectomy  15 YRS AGO  . Excision left sebacous cyst  05-14-2011 W/  LOCAL  . Cataract extraction w/ intraocular lens  implant, bilateral    . Knee arthroscopy  09/26/2011    Procedure: ARTHROSCOPY KNEE;  Surgeon: Loanne Drilling;  Location: [facility];  Service: Orthopedics;  Laterality: Right;  right knee arthroscopy with meniscal tear  . Knee arthroscopy  03/25/2012    Procedure: ARTHROSCOPY KNEE;  Surgeon: Loanne Drilling, MD;  Location: [facility];  Service: Orthopedics;  Laterality: Right;  With Debridement of medial lateral meniscus  . Total knee arthroplasty Right 11/30/2012    Procedure: TOTAL KNEE ARTHROPLASTY;  Surgeon: Loanne Drilling, MD;  Location: [facility];  Service: Orthopedics;  Laterality: Right;    Family History  Problem Relation Age of  Onset  . Thyroid cancer Brother   . Diabetes Mother   . Diabetes Brother   . Heart disease Father   . Heart disease Brother     Family History:  Social History:  reports that she quit smoking about 28 years ago. Her smoking use included Cigarettes. She smoked 0.00 packs per day for 15 years. She has never used smokeless tobacco. She reports that she does not drink alcohol or use illicit drugs.  No Known Allergies  MEDICATIONS:                                                                                                                     I have reviewed the patient's current medications.   ROS:  History obtained from chart review and patient.  General ROS: negative for - chills, fatigue, fever, night sweats, weight gain or weight loss Psychological ROS: negative for - behavioral disorder, hallucinations, memory difficulties, mood swings or suicidal ideation Ophthalmic ROS: negative for - blurry vision, double vision, eye pain or loss of vision ENT ROS: negative for - epistaxis, nasal discharge, oral lesions, sore throat, tinnitus or vertigo Allergy and Immunology ROS: negative for - hives or itchy/watery eyes Hematological and Lymphatic ROS: negative for - bleeding problems, bruising or swollen lymph nodes Endocrine ROS: negative for - galactorrhea, hair pattern changes, polydipsia/polyuria or temperature intolerance Respiratory ROS: negative for - cough, hemoptysis, shortness of breath or wheezing Cardiovascular ROS: negative for - chest pain, dyspnea on exertion, edema or irregular heartbeat Gastrointestinal ROS: negative for - abdominal pain, diarrhea, hematemesis, nausea/vomiting or stool incontinence Genito-Urinary ROS: negative for - dysuria, hematuria, incontinence or urinary frequency/urgency Musculoskeletal ROS: negative for - joint swelling  or muscular weakness Neurological ROS: as noted in HPI Dermatological ROS: negative for rash and skin lesion changes   Physical exam: pleasant female in no apparent distress.Blood pressure 144/72, pulse 89, temperature 98.1 F (36.7 C), temperature source Oral, resp. rate 18, height 5\' 9"  (1.753 m), weight 115.8 kg (255 lb 4.7 oz), SpO2 96.00%. Head: normocephalic. Neck: supple, no bruits, no JVD. Cardiac: no murmurs. Lungs: clear. Abdomen: soft, no tender, no mass. Extremities: no edema.   Neurologic Examination:                                                                                                      Mental Status: Alert, awake, oriented x 4, thought content appropriate. Comprehension, naming, and repetition within normal limits. Speech fluent without evidence of aphasia.  Cranial Nerves: II: Discs flat bilaterally; Visual fields grossly normal, pupils equal, round, reactive to light and accommodation III,IV, VI: ptosis not present, extra-ocular motions intact bilaterally V,VII: smile symmetric, facial light touch sensation normal bilaterally VIII: hearing normal bilaterally IX,X: gag reflex present XI: bilateral shoulder shrug XII: midline tongue extension Motor: Right : Upper extremity   5/5    Left:     Upper extremity   5/5  Lower extremity   5/5     Lower extremity   5/5 Tone and bulk:normal tone throughout; no atrophy noted Sensory: Pinprick and light touch intact throughout, bilaterally Deep Tendon Reflexes: 1+ and symmetric throughout Plantars: Right: downgoing   Left: downgoing Cerebellar: normal finger-to-nose,  normal heel-to-shin test Gait: no ataxia. CV: pulses palpable throughout    No results found for this basename: cbc, bmp, coags, chol, tri, ldl, hga1c    Results for orders placed during the hospital encounter of 12/06/12 (from the past 48 hour(s))  GLUCOSE, CAPILLARY     Status: Abnormal   Collection Time    12/06/12  7:02 PM      Result  Value Range   Glucose-Capillary 228 (*) 70 - 99 mg/dL  URINE CULTURE     Status: None   Collection Time    12/06/12  7:34 PM  Result Value Range   Specimen Description URINE, CLEAN CATCH     Special Requests NONE     Culture  Setup Time 12/07/2012 02:54     Colony Count PENDING     Culture Culture reincubated for better growth     Report Status PENDING    URINALYSIS, ROUTINE W REFLEX MICROSCOPIC     Status: Abnormal   Collection Time    12/06/12  7:34 PM      Result Value Range   Color, Urine YELLOW  YELLOW   APPearance CLOUDY (*) CLEAR   Specific Gravity, Urine 1.013  1.005 - 1.030   pH 6.0  5.0 - 8.0   Glucose, UA NEGATIVE  NEGATIVE mg/dL   Hgb urine dipstick NEGATIVE  NEGATIVE   Bilirubin Urine NEGATIVE  NEGATIVE   Ketones, ur 15 (*) NEGATIVE mg/dL   Protein, ur NEGATIVE  NEGATIVE mg/dL   Urobilinogen, UA 0.2  0.0 - 1.0 mg/dL   Nitrite NEGATIVE  NEGATIVE   Leukocytes, UA SMALL (*) NEGATIVE  URINE MICROSCOPIC-ADD ON     Status: None   Collection Time    12/06/12  7:34 PM      Result Value Range   Squamous Epithelial / LPF RARE  RARE   WBC, UA 3-6  <3 WBC/hpf   Urine-Other MANY YEAST    CBC WITH DIFFERENTIAL     Status: Abnormal   Collection Time    12/06/12  9:10 PM      Result Value Range   WBC 6.1  4.0 - 10.5 K/uL   RBC 3.81 (*) 3.87 - 5.11 MIL/uL   Hemoglobin 10.9 (*) 12.0 - 15.0 g/dL   HCT 11.9 (*) 14.7 - 82.9 %   MCV 84.8  78.0 - 100.0 fL   MCH 28.6  26.0 - 34.0 pg   MCHC 33.7  30.0 - 36.0 g/dL   RDW 56.2  13.0 - 86.5 %   Platelets 163  150 - 400 K/uL   Neutrophils Relative 63  43 - 77 %   Neutro Abs 3.8  1.7 - 7.7 K/uL   Lymphocytes Relative 28  12 - 46 %   Lymphs Abs 1.7  0.7 - 4.0 K/uL   Monocytes Relative 7  3 - 12 %   Monocytes Absolute 0.4  0.1 - 1.0 K/uL   Eosinophils Relative 2  0 - 5 %   Eosinophils Absolute 0.1  0.0 - 0.7 K/uL   Basophils Relative 0  0 - 1 %   Basophils Absolute 0.0  0.0 - 0.1 K/uL  BASIC METABOLIC PANEL     Status:  Abnormal   Collection Time    12/06/12  9:10 PM      Result Value Range   Sodium 133 (*) 135 - 145 mEq/L   Potassium 3.8  3.5 - 5.1 mEq/L   Chloride 99  96 - 112 mEq/L   CO2 28  19 - 32 mEq/L   Glucose, Bld 180 (*) 70 - 99 mg/dL   BUN 15  6 - 23 mg/dL   Creatinine, Ser 7.84  0.50 - 1.10 mg/dL   Calcium 9.1  8.4 - 69.6 mg/dL   GFR calc non Af Amer 84 (*) >90 mL/min   GFR calc Af Amer >90  >90 mL/min   Comment:            The eGFR has been calculated     using the CKD EPI equation.     This calculation has  not been     validated in all clinical     situations.     eGFR's persistently     <90 mL/min signify     possible Chronic Kidney Disease.  GLUCOSE, CAPILLARY     Status: Abnormal   Collection Time    12/07/12  3:06 AM      Result Value Range   Glucose-Capillary 135 (*) 70 - 99 mg/dL   Comment 1 Documented in Chart     Comment 2 Notify RN    MRSA PCR SCREENING     Status: None   Collection Time    12/07/12  3:34 AM      Result Value Range   MRSA by PCR NEGATIVE  NEGATIVE   Comment:            The GeneXpert MRSA Assay (FDA     approved for NASAL specimens     only), is one component of a     comprehensive MRSA colonization     surveillance program. It is not     intended to diagnose MRSA     infection nor to guide or     monitor treatment for     MRSA infections.  BASIC METABOLIC PANEL     Status: Abnormal   Collection Time    12/07/12  5:15 AM      Result Value Range   Sodium 139  135 - 145 mEq/L   Potassium 3.5  3.5 - 5.1 mEq/L   Chloride 103  96 - 112 mEq/L   CO2 27  19 - 32 mEq/L   Glucose, Bld 143 (*) 70 - 99 mg/dL   BUN 11  6 - 23 mg/dL   Creatinine, Ser 1.61  0.50 - 1.10 mg/dL   Calcium 9.2  8.4 - 09.6 mg/dL   GFR calc non Af Amer >90  >90 mL/min   GFR calc Af Amer >90  >90 mL/min   Comment:            The eGFR has been calculated     using the CKD EPI equation.     This calculation has not been     validated in all clinical     situations.      eGFR's persistently     <90 mL/min signify     possible Chronic Kidney Disease.  CBC     Status: Abnormal   Collection Time    12/07/12  5:15 AM      Result Value Range   WBC 6.6  4.0 - 10.5 K/uL   RBC 3.69 (*) 3.87 - 5.11 MIL/uL   Hemoglobin 10.3 (*) 12.0 - 15.0 g/dL   HCT 04.5 (*) 40.9 - 81.1 %   MCV 84.8  78.0 - 100.0 fL   MCH 27.9  26.0 - 34.0 pg   MCHC 32.9  30.0 - 36.0 g/dL   RDW 91.4  78.2 - 95.6 %   Platelets 185  150 - 400 K/uL  HEMOGLOBIN A1C     Status: Abnormal   Collection Time    12/07/12  5:15 AM      Result Value Range   Hemoglobin A1C 9.2 (*) <5.7 %   Comment: (NOTE)  According to the ADA Clinical Practice Recommendations for 2011, when     HbA1c is used as a screening test:      >=6.5%   Diagnostic of Diabetes Mellitus               (if abnormal result is confirmed)     5.7-6.4%   Increased risk of developing Diabetes Mellitus     References:Diagnosis and Classification of Diabetes Mellitus,Diabetes     Care,2011,34(Suppl 1):S62-S69 and Standards of Medical Care in             Diabetes - 2011,Diabetes Care,2011,34 (Suppl 1):S11-S61.   Mean Plasma Glucose 217 (*) <117 mg/dL  TSH     Status: None   Collection Time    12/07/12  5:16 AM      Result Value Range   TSH 1.171  0.350 - 4.500 uIU/mL  HEPATIC FUNCTION PANEL     Status: Abnormal   Collection Time    12/07/12  5:16 AM      Result Value Range   Total Protein 7.0  6.0 - 8.3 g/dL   Albumin 2.7 (*) 3.5 - 5.2 g/dL   AST 21  0 - 37 U/L   ALT 18  0 - 35 U/L   Alkaline Phosphatase 68  39 - 117 U/L   Total Bilirubin 1.1  0.3 - 1.2 mg/dL   Bilirubin, Direct 0.3  0.0 - 0.3 mg/dL   Indirect Bilirubin 0.8  0.3 - 0.9 mg/dL  RPR     Status: None   Collection Time    12/07/12  5:16 AM      Result Value Range   RPR NON REACTIVE  NON REACTIVE  AMMONIA     Status: None   Collection Time    12/07/12  5:16 AM      Result Value Range    Ammonia 16  11 - 60 umol/L  GLUCOSE, CAPILLARY     Status: None   Collection Time    12/07/12  7:44 AM      Result Value Range   Glucose-Capillary 98  70 - 99 mg/dL  GLUCOSE, CAPILLARY     Status: Abnormal   Collection Time    12/07/12  9:54 AM      Result Value Range   Glucose-Capillary 105 (*) 70 - 99 mg/dL  GLUCOSE, CAPILLARY     Status: Abnormal   Collection Time    12/07/12  1:29 PM      Result Value Range   Glucose-Capillary 157 (*) 70 - 99 mg/dL  GLUCOSE, CAPILLARY     Status: Abnormal   Collection Time    12/07/12  5:14 PM      Result Value Range   Glucose-Capillary 239 (*) 70 - 99 mg/dL  GLUCOSE, CAPILLARY     Status: Abnormal   Collection Time    12/07/12  9:39 PM      Result Value Range   Glucose-Capillary 173 (*) 70 - 99 mg/dL   Comment 1 Documented in Chart     Comment 2 Notify RN      Dg Chest 2 View  12/07/2012  *RADIOLOGY REPORT*  Clinical Data: Stroke  CHEST - 2 VIEW  Comparison: 11/24/2012  Findings: Decreased lung volume with mild atelectasis in the lung bases.  Cardiac enlargement without heart failure.  Negative for pneumonia or effusion.  IMPRESSION: Hypoventilation with bibasilar atelectasis.   Original Report Authenticated By: Janeece Riggers, M.D.    Ct Head Wo Contrast  12/06/2012  *RADIOLOGY REPORT*  Clinical Data: Altered mental status, weakness  CT HEAD WITHOUT CONTRAST  Technique:  Contiguous axial images were obtained from the base of the skull through the vertex without contrast.  Comparison: None.  Findings: Periventricular and subcortical white matter hypodensities are most in keeping with chronic microangiopathic change. There is no evidence for acute hemorrhage, hydrocephalus, mass lesion, or abnormal extra-axial fluid collection.  No definite CT evidence for acute infarction.  Atherosclerotic calcification noted. The visualized paranasal sinuses and mastoid air cells are predominately clear.  IMPRESSION: White matter changes are nonspecific, a  finding often seen with chronic microangiopathic change.  No definite CT evidence of acute intracranial abnormality.   Original Report Authenticated By: Jearld Lesch, M.D.    Mr Brain Wo Contrast  12/07/2012  *RADIOLOGY REPORT*  Clinical Data: Altered mental status  MRI HEAD WITHOUT CONTRAST  Technique:  Multiplanar, multiecho pulse sequences of the brain and surrounding structures were obtained according to standard protocol without intravenous contrast.  Comparison: Head CT 12/06/2012  Findings: There is a sub-centimeter acute infarction affecting the white matter adjacent to the atrium of the left lateral ventricle. There is no swelling or mass effect.  There is a punctate focus of susceptibility artifact suggesting minor hemorrhagic products.  No actual hematoma.  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  I do not see evidence that there has been previous hemorrhage.  Presumably, this is incidental to the current presentation.  There are extensive chronic small vessel changes throughout the pons.  The cerebral hemispheres show old small vessel changes affecting the basal ganglia, thalami and throughout the hemispheric deep white matter.  No cortical or large vessel territory infarction.  No mass lesion, hydrocephalus or extra-axial collection.  No pituitary mass.  Paranasal sinuses are clear. There is some fluid in the left mastoid air cells.  IMPRESSION: Sub-centimeter acute infarction affecting the white matter adjacent to the atrium of the left lateral ventricle.  Tiny focus of hemosiderin in the region suggesting minor blood products. Certainly there is no measurable hematoma.  Extensive chronic small vessel changes elsewhere throughout the brain.  Fluid in the mastoid air cells on the left.  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  This is probably not acutely relevant. There is no sign of previous hemorrhage related to that.   Original Report Authenticated By: Paulina Fusi, M.D.      Assessment/Plan: 67 years old female with hypertension and hyperlipidemia, admitted to the hospital with an acute confusional state without lateralizing neurological findings. MRI-DWI revealed a small acute infarction affecting the white matter adjacent to the atrium of the left lateral ventricle, which in conjunction with the documented use of post op narcotics could explain patient's altered mental status. The stroke mechanism is most likely due to small vessel disease in the context of hyperlipidemia and diabetes. She stated that she can not tolerate aspirin and thus will suggest plavix for secondary stroke prevention. Continue simvastatin. She is quite stable from a neurological standpoint at this moment, and thus will see her again in case of new neurological developments, questions, or concerns.   Wyatt Portela, MD [facility] [phone]  12/08/2012, 7:33 AM

## 2012-12-09 LAB — URINE CULTURE: Colony Count: >=100000

## 2012-12-10 LAB — URINE CULTURE: Colony Count: >=100000

## 2013-01-12 ENCOUNTER — Ambulatory Visit: Payer: Medicare Other | Attending: Orthopedic Surgery | Admitting: Physical Therapy

## 2013-01-12 DIAGNOSIS — M25669 Stiffness of unspecified knee, not elsewhere classified: Secondary | ICD-10-CM | POA: Insufficient documentation

## 2013-01-12 DIAGNOSIS — M25569 Pain in unspecified knee: Secondary | ICD-10-CM | POA: Insufficient documentation

## 2013-01-12 DIAGNOSIS — R269 Unspecified abnormalities of gait and mobility: Secondary | ICD-10-CM | POA: Insufficient documentation

## 2013-01-12 DIAGNOSIS — IMO0001 Reserved for inherently not codable concepts without codable children: Secondary | ICD-10-CM | POA: Insufficient documentation

## 2013-01-12 DIAGNOSIS — R5381 Other malaise: Secondary | ICD-10-CM | POA: Insufficient documentation

## 2013-01-15 ENCOUNTER — Ambulatory Visit: Payer: Medicare Other | Admitting: *Deleted

## 2013-01-18 ENCOUNTER — Ambulatory Visit: Payer: Medicare Other | Admitting: *Deleted

## 2013-01-20 ENCOUNTER — Encounter: Payer: Medicare Other | Admitting: *Deleted

## 2013-01-22 ENCOUNTER — Ambulatory Visit: Payer: Medicare Other | Attending: Orthopedic Surgery | Admitting: *Deleted

## 2013-01-22 DIAGNOSIS — R5381 Other malaise: Secondary | ICD-10-CM | POA: Insufficient documentation

## 2013-01-22 DIAGNOSIS — R269 Unspecified abnormalities of gait and mobility: Secondary | ICD-10-CM | POA: Insufficient documentation

## 2013-01-22 DIAGNOSIS — M25569 Pain in unspecified knee: Secondary | ICD-10-CM | POA: Insufficient documentation

## 2013-01-22 DIAGNOSIS — IMO0001 Reserved for inherently not codable concepts without codable children: Secondary | ICD-10-CM | POA: Insufficient documentation

## 2013-01-22 DIAGNOSIS — M25669 Stiffness of unspecified knee, not elsewhere classified: Secondary | ICD-10-CM | POA: Insufficient documentation

## 2013-01-25 ENCOUNTER — Ambulatory Visit: Payer: Medicare Other | Admitting: Physical Therapy

## 2013-01-27 ENCOUNTER — Ambulatory Visit: Payer: Medicare Other | Admitting: Physical Therapy

## 2013-01-29 ENCOUNTER — Ambulatory Visit: Payer: Medicare Other | Admitting: Physical Therapy

## 2013-02-01 ENCOUNTER — Ambulatory Visit (INDEPENDENT_AMBULATORY_CARE_PROVIDER_SITE_OTHER): Payer: Medicare Other

## 2013-02-01 ENCOUNTER — Ambulatory Visit: Payer: Medicare Other | Admitting: Physical Therapy

## 2013-02-01 ENCOUNTER — Other Ambulatory Visit: Payer: Self-pay | Admitting: Orthopedic Surgery

## 2013-02-01 DIAGNOSIS — M542 Cervicalgia: Secondary | ICD-10-CM

## 2013-02-01 DIAGNOSIS — M549 Dorsalgia, unspecified: Secondary | ICD-10-CM

## 2013-02-01 DIAGNOSIS — M47817 Spondylosis without myelopathy or radiculopathy, lumbosacral region: Secondary | ICD-10-CM

## 2013-02-08 ENCOUNTER — Ambulatory Visit: Payer: Medicare Other | Admitting: Physical Therapy

## 2013-02-10 ENCOUNTER — Ambulatory Visit: Payer: Medicare Other | Admitting: Physical Therapy

## 2013-02-12 ENCOUNTER — Ambulatory Visit: Payer: Medicare Other | Admitting: *Deleted

## 2013-02-15 ENCOUNTER — Encounter: Payer: Medicare Other | Admitting: Physical Therapy

## 2013-02-16 ENCOUNTER — Ambulatory Visit: Payer: Medicare Other | Admitting: Physical Therapy

## 2013-02-17 ENCOUNTER — Ambulatory Visit: Payer: Medicare Other | Admitting: Physical Therapy

## 2013-02-19 ENCOUNTER — Ambulatory Visit: Payer: Medicare Other | Admitting: Physical Therapy

## 2013-02-22 ENCOUNTER — Ambulatory Visit: Payer: Medicare Other | Attending: Orthopedic Surgery | Admitting: Physical Therapy

## 2013-02-22 DIAGNOSIS — R269 Unspecified abnormalities of gait and mobility: Secondary | ICD-10-CM | POA: Insufficient documentation

## 2013-02-22 DIAGNOSIS — R5381 Other malaise: Secondary | ICD-10-CM | POA: Insufficient documentation

## 2013-02-22 DIAGNOSIS — M25669 Stiffness of unspecified knee, not elsewhere classified: Secondary | ICD-10-CM | POA: Insufficient documentation

## 2013-02-22 DIAGNOSIS — M25569 Pain in unspecified knee: Secondary | ICD-10-CM | POA: Insufficient documentation

## 2013-02-22 DIAGNOSIS — IMO0001 Reserved for inherently not codable concepts without codable children: Secondary | ICD-10-CM | POA: Insufficient documentation

## 2013-02-25 ENCOUNTER — Ambulatory Visit: Payer: Medicare Other | Admitting: Physical Therapy

## 2013-02-26 ENCOUNTER — Ambulatory Visit: Payer: Medicare Other

## 2013-03-01 ENCOUNTER — Ambulatory Visit: Payer: Medicare Other | Admitting: Physical Therapy

## 2013-03-03 ENCOUNTER — Ambulatory Visit: Payer: Medicare Other | Admitting: Physical Therapy

## 2013-03-05 ENCOUNTER — Encounter: Payer: Medicare Other | Admitting: Physical Therapy

## 2013-03-08 ENCOUNTER — Ambulatory Visit: Payer: Medicare Other | Admitting: Physical Therapy

## 2013-03-10 ENCOUNTER — Encounter: Payer: Medicare Other | Admitting: Physical Therapy

## 2013-03-12 ENCOUNTER — Ambulatory Visit: Payer: Medicare Other | Admitting: *Deleted

## 2013-03-16 ENCOUNTER — Ambulatory Visit: Payer: Medicare Other | Admitting: Physical Therapy

## 2013-03-18 ENCOUNTER — Encounter: Payer: Medicare Other | Admitting: Physical Therapy

## 2013-08-05 ENCOUNTER — Ambulatory Visit: Payer: Medicare Other | Attending: Orthopedic Surgery | Admitting: Physical Therapy

## 2013-08-05 DIAGNOSIS — M25669 Stiffness of unspecified knee, not elsewhere classified: Secondary | ICD-10-CM | POA: Insufficient documentation

## 2013-08-05 DIAGNOSIS — R5381 Other malaise: Secondary | ICD-10-CM | POA: Insufficient documentation

## 2013-08-05 DIAGNOSIS — M25569 Pain in unspecified knee: Secondary | ICD-10-CM | POA: Insufficient documentation

## 2013-08-05 DIAGNOSIS — Z96659 Presence of unspecified artificial knee joint: Secondary | ICD-10-CM | POA: Insufficient documentation

## 2013-08-05 DIAGNOSIS — IMO0001 Reserved for inherently not codable concepts without codable children: Secondary | ICD-10-CM | POA: Insufficient documentation

## 2013-08-10 ENCOUNTER — Encounter: Payer: Medicare Other | Admitting: *Deleted

## 2013-08-12 ENCOUNTER — Encounter: Payer: Medicare Other | Admitting: *Deleted

## 2013-08-24 ENCOUNTER — Ambulatory Visit: Payer: Medicare Other | Attending: Orthopedic Surgery | Admitting: *Deleted

## 2013-08-24 DIAGNOSIS — R5381 Other malaise: Secondary | ICD-10-CM | POA: Insufficient documentation

## 2013-08-24 DIAGNOSIS — M25669 Stiffness of unspecified knee, not elsewhere classified: Secondary | ICD-10-CM | POA: Insufficient documentation

## 2013-08-24 DIAGNOSIS — M25569 Pain in unspecified knee: Secondary | ICD-10-CM | POA: Insufficient documentation

## 2013-08-24 DIAGNOSIS — IMO0001 Reserved for inherently not codable concepts without codable children: Secondary | ICD-10-CM | POA: Insufficient documentation

## 2013-08-24 DIAGNOSIS — Z96659 Presence of unspecified artificial knee joint: Secondary | ICD-10-CM | POA: Insufficient documentation

## 2013-08-26 ENCOUNTER — Encounter: Payer: Medicare Other | Admitting: Physical Therapy

## 2013-08-27 ENCOUNTER — Encounter: Payer: Medicare Other | Admitting: Physical Therapy

## 2013-08-31 ENCOUNTER — Ambulatory Visit: Payer: Medicare Other | Admitting: *Deleted

## 2013-09-02 ENCOUNTER — Ambulatory Visit: Payer: Medicare Other | Admitting: *Deleted

## 2013-09-08 ENCOUNTER — Ambulatory Visit: Payer: Medicare Other | Admitting: Gastroenterology

## 2013-10-27 ENCOUNTER — Ambulatory Visit: Payer: Medicare Other | Admitting: Gastroenterology

## 2013-11-22 ENCOUNTER — Ambulatory Visit: Payer: Medicare Other | Admitting: Gastroenterology

## 2013-12-29 ENCOUNTER — Ambulatory Visit: Payer: Medicare Other | Admitting: Cardiovascular Disease

## 2013-12-30 ENCOUNTER — Ambulatory Visit (INDEPENDENT_AMBULATORY_CARE_PROVIDER_SITE_OTHER): Payer: Medicare Other | Admitting: Cardiovascular Disease

## 2013-12-30 ENCOUNTER — Encounter: Payer: Self-pay | Admitting: Cardiovascular Disease

## 2013-12-30 VITALS — BP 112/70 | HR 79 | Ht 69.0 in | Wt 264.9 lb

## 2013-12-30 DIAGNOSIS — R0602 Shortness of breath: Secondary | ICD-10-CM

## 2013-12-30 DIAGNOSIS — R079 Chest pain, unspecified: Secondary | ICD-10-CM

## 2013-12-30 DIAGNOSIS — E785 Hyperlipidemia, unspecified: Secondary | ICD-10-CM | POA: Insufficient documentation

## 2013-12-30 NOTE — Patient Instructions (Signed)
Dr Gwenlyn Found has ordered the following tests: Your physician has requested that you have a lexiscan myoview. For further information please visit [email or web address]. Please follow instruction sheet, as given.  Your physician has requested that you have an echocardiogram. Echocardiography is a painless test that uses sound waves to create images of your heart. It provides your doctor with information about the size and shape of your heart and how well your heart's chambers and valves are working. This procedure takes approximately one hour. There are no restrictions for this procedure.      He will see you back in: after the tests

## 2013-12-30 NOTE — Progress Notes (Signed)
12/30/2013 Paula Steele   02-03-1946  [id number]  Primary Physician Octavio Graves, DO Primary Cardiologist: Lorretta Harp MD Renae Gloss   HPI:  Paula Steele is a 68 year old moderately overweight married Caucasian female mother of 4 children, grandmother of 56 grandchildren referred by Dr. Octavio Graves and her Raoul for cardiovascular evaluation because of a 6 month history of increasing shortness of breath. Her cardiac risk factor profile is marginal for a strong family history heart disease with several brothers have had open heart surgery as well as a father who died of a myocardial infarction at age 56. She smoked remotely and quit 20 years ago probably smoking 15-20 pack years. Her other problems include history of hyperlipidemia and diabetes. She's never had a heart attack or stroke. Over the last 6 months she's complained of increasing shortness of breath with occasional substernal chest pain.   Current Outpatient Prescriptions  Medication Sig Dispense Refill  . albuterol (PROVENTIL HFA;VENTOLIN HFA) 108 (90 BASE) MCG/ACT inhaler Inhale 2 puffs into the lungs every 6 (six) hours as needed. Wheezing      . diazepam (VALIUM) 10 MG tablet Take 0.5 tablets (5 mg total) by mouth every 12 (twelve) hours as needed for anxiety. Muscle spasms  5 tablet  0  . fexofenadine (ALLEGRA) 180 MG tablet Take 180 mg by mouth daily.      Marland Kitchen gabapentin (NEURONTIN) 300 MG capsule Take 300 mg by mouth at bedtime.      Marland Kitchen glimepiride (AMARYL) 4 MG tablet Take 4 mg by mouth 2 (two) times daily.       Marland Kitchen HYDROcodone-acetaminophen (NORCO/VICODIN) 5-325 MG per tablet Take 1 tablet by mouth every 6 (six) hours as needed for pain.  1 tablet  0  . insulin glargine (LANTUS) 100 UNIT/ML injection Inject 60 Units into the skin 2 (two) times daily. Sliding scale in the mornings      . NOVOLOG FLEXPEN 100 UNIT/ML injection Inject 12-38 Units into the skin 3 (three) times daily. PER Sliding scale       . omeprazole (PRILOSEC) 20 MG capsule Take 20 mg by mouth 2 (two) times daily.       . simvastatin (ZOCOR) 10 MG tablet Take 10 mg by mouth every morning.      . sitaGLIPtin (JANUVIA) 100 MG tablet Take 100 mg by mouth every morning.      Marland Kitchen tiZANidine (ZANAFLEX) 4 MG tablet Take 4 mg by mouth at bedtime. As needed      . triamcinolone (KENALOG) 0.1 % cream Apply 1 application topically 2 (two) times daily as needed. itching      . Vitamin D, Ergocalciferol, (DRISDOL) 50000 UNITS CAPS Take 50,000 Units by mouth every Sunday.        No current facility-administered medications for this visit.    No Known Allergies  History   Social History  . Marital Status: Married    Spouse Name: N/A    Number of Children: 4  . Years of Education: N/A   Occupational History  . Disabled    Social History Main Topics  . Smoking status: Former Smoker -- 15 years    Types: Cigarettes    Quit date: 09/22/1984  . Smokeless tobacco: Never Used  . Alcohol Use: No  . Drug Use: No  . Sexual Activity: Not Currently   Other Topics Concern  . Not on file   Social History Narrative  . No narrative on file  Review of Systems: General: negative for chills, fever, night sweats or weight changes.  Cardiovascular: negative for chest pain, dyspnea on exertion, edema, orthopnea, palpitations, paroxysmal nocturnal dyspnea or shortness of breath Dermatological: negative for rash Respiratory: negative for cough or wheezing Urologic: negative for hematuria Abdominal: negative for nausea, vomiting, diarrhea, bright red blood per rectum, melena, or hematemesis Neurologic: negative for visual changes, syncope, or dizziness All other systems reviewed and are otherwise negative except as noted above.    Blood pressure 112/70, pulse 79, height 5\' 9"  (1.753 m), weight 264 lb 14.4 oz (120.158 kg).  General appearance: alert and no distress Neck: no adenopathy, no carotid bruit, no JVD, supple, symmetrical,  trachea midline and thyroid not enlarged, symmetric, no tenderness/mass/nodules Lungs: clear to auscultation bilaterally Heart: regular rate and rhythm, S1, S2 normal, no murmur, click, rub or gallop Abdomen: soft, non-tender; bowel sounds normal; no masses,  no organomegaly Extremities: extremities normal, atraumatic, no cyanosis or edema and 2+ pedal pulses  EKG normal sinus rhythm at 79 with no ST or T wave changes  ASSESSMENT AND PLAN:   Shortness of breath Patient was referred for evaluation of shortness of breath. She did smoke 15-20 pack years but quit 20 years ago. She has a history of hyperlipidemia andinsulin-requiring diabetes. She had a 2-D echocardiogram performed a year ago showed normal systolic function. She also complains of some mild occasional chest pain. She's unable to walk because of knee issues. I'm going to recheck a 2-D echo and a from a Myoview stress test to rule out an ischemic etiology.  Hyperlipidemia On statin therapy followed by her PCP. The left lipid profile I have in our record was from 12/08/12 revealed a total cholesterol 104,, LDL 61 HDL of 23      Lorretta Harp MD [facility], [facility] 12/30/2013 2:21 PM

## 2013-12-30 NOTE — Assessment & Plan Note (Signed)
Patient was referred for evaluation of shortness of breath. She did smoke 15-20 pack years but quit 20 years ago. She has a history of hyperlipidemia andinsulin-requiring diabetes. She had a 2-D echocardiogram performed a year ago showed normal systolic function. She also complains of some mild occasional chest pain. She's unable to walk because of knee issues. I'm going to recheck a 2-D echo and a from a Myoview stress test to rule out an ischemic etiology.

## 2013-12-30 NOTE — Assessment & Plan Note (Signed)
On statin therapy followed by her PCP. The left lipid profile I have in our record was from 12/08/12 revealed a total cholesterol 104,, LDL 61 HDL of 23

## 2014-01-05 ENCOUNTER — Telehealth (HOSPITAL_COMMUNITY): Payer: Self-pay | Admitting: *Deleted

## 2014-01-05 ENCOUNTER — Other Ambulatory Visit: Payer: Self-pay | Admitting: Orthopedic Surgery

## 2014-01-05 DIAGNOSIS — M549 Dorsalgia, unspecified: Principal | ICD-10-CM

## 2014-01-05 DIAGNOSIS — G8929 Other chronic pain: Secondary | ICD-10-CM

## 2014-01-10 ENCOUNTER — Other Ambulatory Visit: Payer: Medicare Other

## 2014-01-13 ENCOUNTER — Ambulatory Visit
Admission: RE | Admit: 2014-01-13 | Discharge: 2014-01-13 | Disposition: A | Discharge status: Home / Self Care | Payer: Medicare Other | Source: Ambulatory Visit | Attending: Orthopedic Surgery | Admitting: Orthopedic Surgery

## 2014-01-13 ENCOUNTER — Other Ambulatory Visit: Payer: Self-pay | Admitting: Orthopedic Surgery

## 2014-01-13 DIAGNOSIS — M549 Dorsalgia, unspecified: Principal | ICD-10-CM

## 2014-01-13 DIAGNOSIS — G8929 Other chronic pain: Secondary | ICD-10-CM

## 2014-01-13 MED ORDER — METHYLPREDNISOLONE ACETATE 40 MG/ML INJ SUSP (RADIOLOG
120.0000 mg | Freq: Once | INTRAMUSCULAR | Status: AC
  Administered 2014-01-13: 120 mg via EPIDURAL

## 2014-01-13 MED ORDER — IOHEXOL 180 MG/ML  SOLN
1.0000 mL | Freq: Once | INTRAMUSCULAR | Status: AC | PRN
  Administered 2014-01-13: 1 mL via EPIDURAL

## 2014-01-13 NOTE — Discharge Instructions (Signed)

## 2014-01-21 ENCOUNTER — Telehealth (HOSPITAL_COMMUNITY): Payer: Self-pay | Admitting: *Deleted

## 2014-03-01 ENCOUNTER — Other Ambulatory Visit: Payer: Self-pay | Admitting: Orthopedic Surgery

## 2014-03-01 DIAGNOSIS — M47817 Spondylosis without myelopathy or radiculopathy, lumbosacral region: Secondary | ICD-10-CM

## 2014-03-02 ENCOUNTER — Ambulatory Visit
Admission: RE | Admit: 2014-03-02 | Discharge: 2014-03-02 | Disposition: A | Discharge status: Home / Self Care | Payer: Medicare Other | Source: Ambulatory Visit | Attending: Orthopedic Surgery | Admitting: Orthopedic Surgery

## 2014-03-02 DIAGNOSIS — M47817 Spondylosis without myelopathy or radiculopathy, lumbosacral region: Secondary | ICD-10-CM

## 2014-03-02 MED ORDER — METHYLPREDNISOLONE ACETATE 40 MG/ML INJ SUSP (RADIOLOG
120.0000 mg | Freq: Once | INTRAMUSCULAR | Status: AC
  Administered 2014-03-02: 120 mg via INTRA_ARTICULAR

## 2014-03-02 MED ORDER — IOHEXOL 180 MG/ML  SOLN
1.0000 mL | Freq: Once | INTRAMUSCULAR | Status: AC | PRN
  Administered 2014-03-02: 1 mL via INTRA_ARTICULAR

## 2014-06-30 ENCOUNTER — Encounter: Payer: Self-pay | Admitting: Gastroenterology

## 2014-07-04 ENCOUNTER — Ambulatory Visit: Payer: Medicare Other | Attending: Student | Admitting: Physical Therapy

## 2014-07-04 DIAGNOSIS — IMO0001 Reserved for inherently not codable concepts without codable children: Secondary | ICD-10-CM | POA: Diagnosis present

## 2014-07-04 DIAGNOSIS — M25569 Pain in unspecified knee: Secondary | ICD-10-CM | POA: Insufficient documentation

## 2014-07-04 DIAGNOSIS — Z96659 Presence of unspecified artificial knee joint: Secondary | ICD-10-CM | POA: Diagnosis not present

## 2014-07-04 DIAGNOSIS — R5381 Other malaise: Secondary | ICD-10-CM | POA: Diagnosis not present

## 2014-07-07 ENCOUNTER — Encounter: Payer: Medicare Other | Admitting: Physical Therapy

## 2014-07-11 ENCOUNTER — Ambulatory Visit: Payer: Medicare Other | Admitting: Physical Therapy

## 2014-07-11 DIAGNOSIS — IMO0001 Reserved for inherently not codable concepts without codable children: Secondary | ICD-10-CM | POA: Diagnosis not present

## 2014-07-13 ENCOUNTER — Ambulatory Visit: Payer: Medicare Other | Admitting: Physical Therapy

## 2014-07-13 DIAGNOSIS — IMO0001 Reserved for inherently not codable concepts without codable children: Secondary | ICD-10-CM | POA: Diagnosis not present

## 2014-07-15 ENCOUNTER — Ambulatory Visit: Payer: Medicare Other | Admitting: *Deleted

## 2014-07-15 DIAGNOSIS — IMO0001 Reserved for inherently not codable concepts without codable children: Secondary | ICD-10-CM | POA: Diagnosis not present

## 2014-07-18 ENCOUNTER — Ambulatory Visit: Payer: Medicare Other | Admitting: Physical Therapy

## 2014-07-18 DIAGNOSIS — IMO0001 Reserved for inherently not codable concepts without codable children: Secondary | ICD-10-CM | POA: Diagnosis not present

## 2014-07-21 ENCOUNTER — Ambulatory Visit: Payer: Medicare Other | Attending: Student | Admitting: *Deleted

## 2014-07-21 DIAGNOSIS — M25561 Pain in right knee: Secondary | ICD-10-CM | POA: Insufficient documentation

## 2014-07-21 DIAGNOSIS — Z96651 Presence of right artificial knee joint: Secondary | ICD-10-CM | POA: Insufficient documentation

## 2014-07-21 DIAGNOSIS — R5381 Other malaise: Secondary | ICD-10-CM | POA: Diagnosis not present

## 2014-07-21 DIAGNOSIS — Z5189 Encounter for other specified aftercare: Secondary | ICD-10-CM | POA: Diagnosis present

## 2014-08-04 ENCOUNTER — Ambulatory Visit: Payer: Medicare Other | Admitting: *Deleted

## 2014-08-04 DIAGNOSIS — Z5189 Encounter for other specified aftercare: Secondary | ICD-10-CM | POA: Diagnosis not present

## 2014-08-05 ENCOUNTER — Ambulatory Visit: Payer: Medicare Other | Admitting: Physical Therapy

## 2014-08-05 DIAGNOSIS — Z5189 Encounter for other specified aftercare: Secondary | ICD-10-CM | POA: Diagnosis not present

## 2014-08-08 ENCOUNTER — Ambulatory Visit: Payer: Medicare Other | Admitting: Physical Therapy

## 2014-08-18 ENCOUNTER — Other Ambulatory Visit (HOSPITAL_COMMUNITY): Payer: Self-pay | Admitting: Otolaryngology

## 2014-08-18 DIAGNOSIS — M542 Cervicalgia: Secondary | ICD-10-CM

## 2014-08-22 ENCOUNTER — Ambulatory Visit (HOSPITAL_COMMUNITY)
Admission: RE | Admit: 2014-08-22 | Discharge: 2014-08-22 | Disposition: A | Discharge status: Home / Self Care | Payer: Medicare Other | Source: Ambulatory Visit | Attending: Otolaryngology | Admitting: Otolaryngology

## 2014-08-22 DIAGNOSIS — M542 Cervicalgia: Secondary | ICD-10-CM | POA: Diagnosis not present

## 2014-08-22 DIAGNOSIS — M25512 Pain in left shoulder: Secondary | ICD-10-CM | POA: Insufficient documentation

## 2014-08-22 LAB — POCT I-STAT CREATININE: Creatinine, Ser: 0.7 mg/dL (ref 0.50–1.10)

## 2014-08-22 MED ORDER — IOHEXOL 300 MG/ML  SOLN
80.0000 mL | Freq: Once | INTRAMUSCULAR | Status: AC | PRN
  Administered 2014-08-22: 80 mL via INTRAVENOUS

## 2015-02-25 IMAGING — CT CT NECK W/ CM
4 series · 14 of 33 positions shown, 17 images · IV contrast (Omnipaque 300)
Comparison: None

CLINICAL DATA: Patient complaining of left-sided neck pain
radiating down to the left shoulder for he years. History of neck
carcinoma in 2335 treated with surgery only.

EXAM:
CT NECK WITH CONTRAST
TECHNIQUE: Multidetector CT imaging of the neck was performed using the
standard protocol following the bolus administration of intravenous
contrast.
CONTRAST:  80mL OMNIPAQUE IOHEXOL 300 MG/ML  SOLN

[Series 2: soft tissue neck 2.0 b31s · axial · 0.47mm/px · z∈[+28,+186]mm · 5 of 119 slices shown, 7 images]
[im 20/119  soft-tissue]
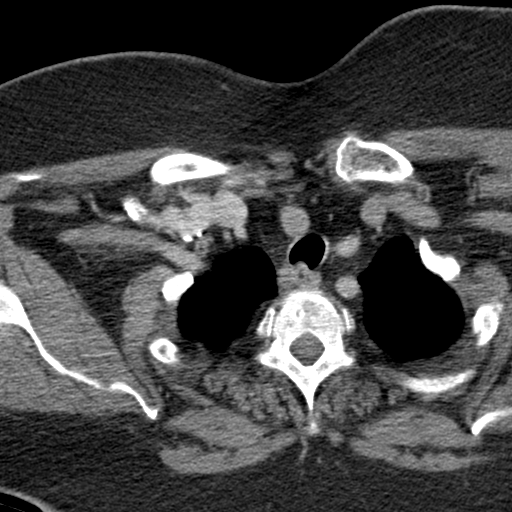
[im 20/119  bone]
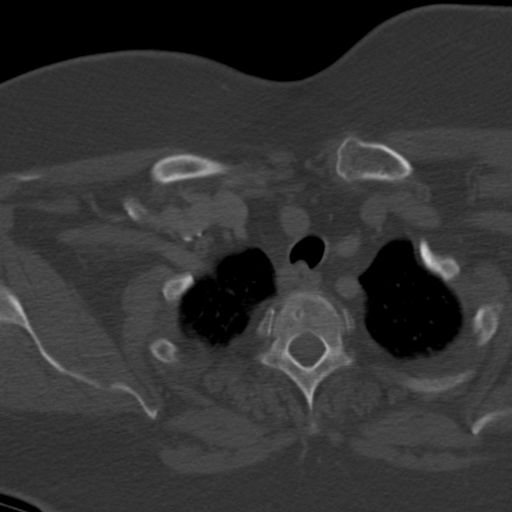
[im 40/119  bone]
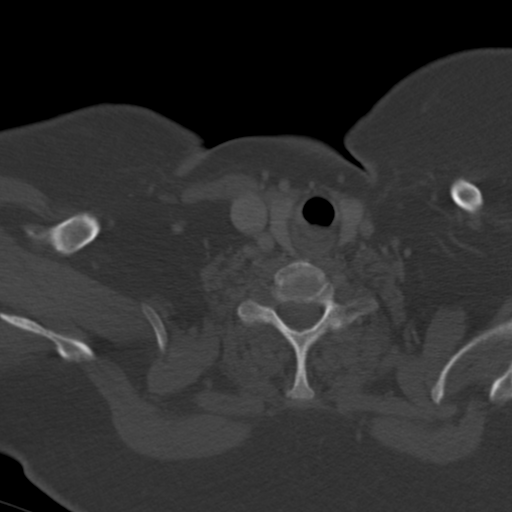
[im 60/119  bone]
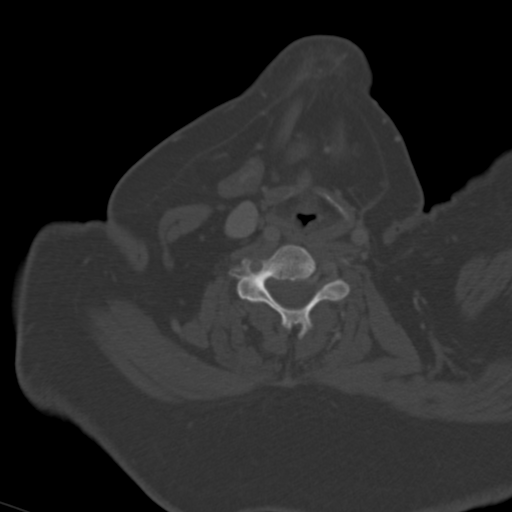
[im 79/119  bone]
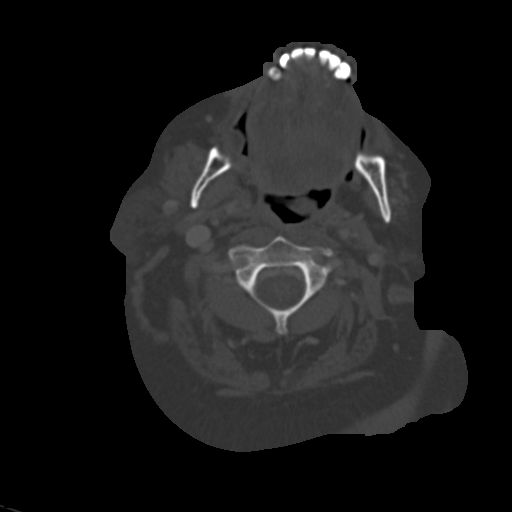
[im 99/119  soft-tissue]
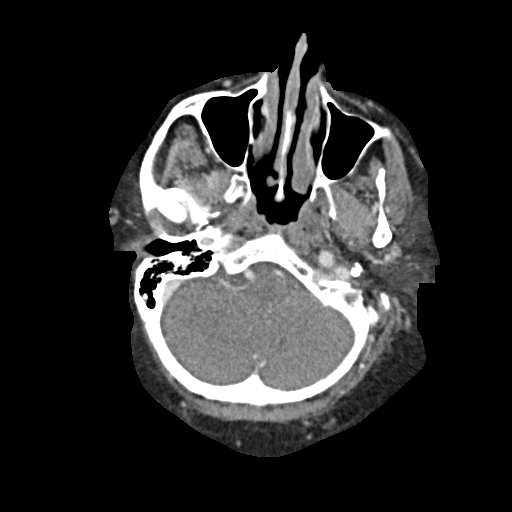
[im 99/119  bone]
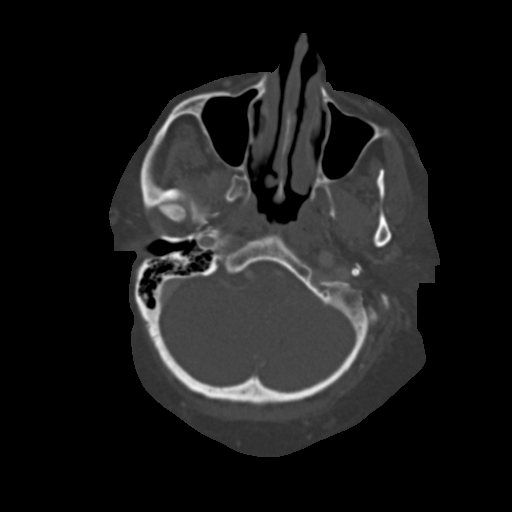

[Series 4: neck 2.0 soft tissue sag · sagittal · 0.43mm/px · 5 of 104 slices shown, 6 images]
[im 35/104  bone]
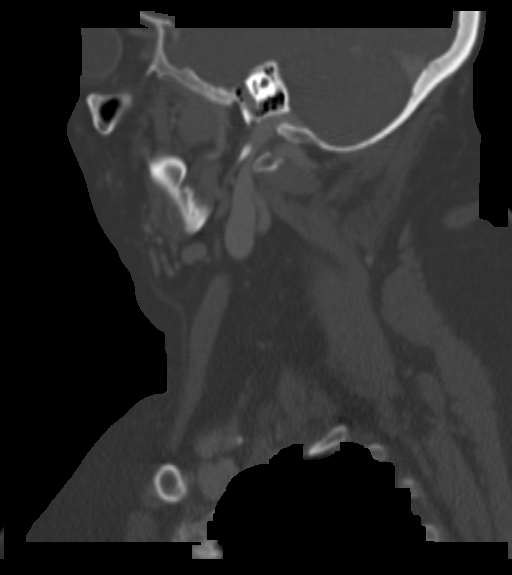
[im 43/104  bone]
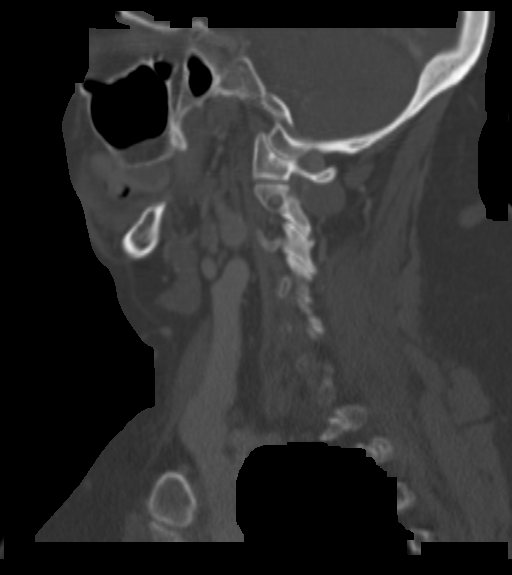
[im 52/104  soft-tissue]
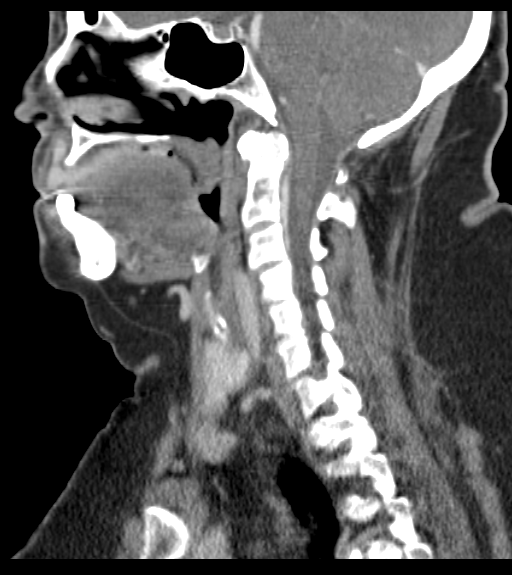
[im 52/104  bone]
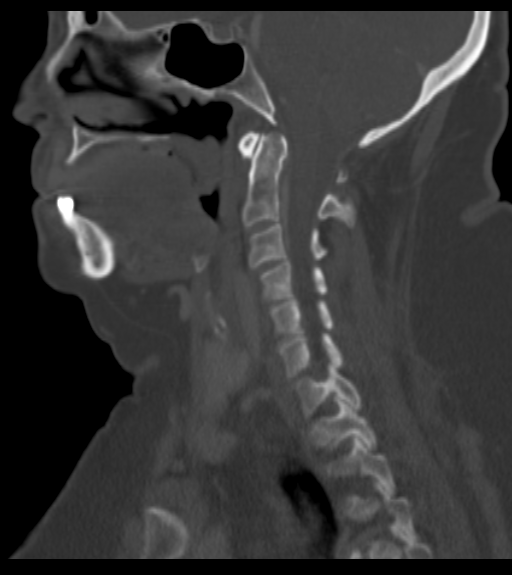
[im 61/104  bone]
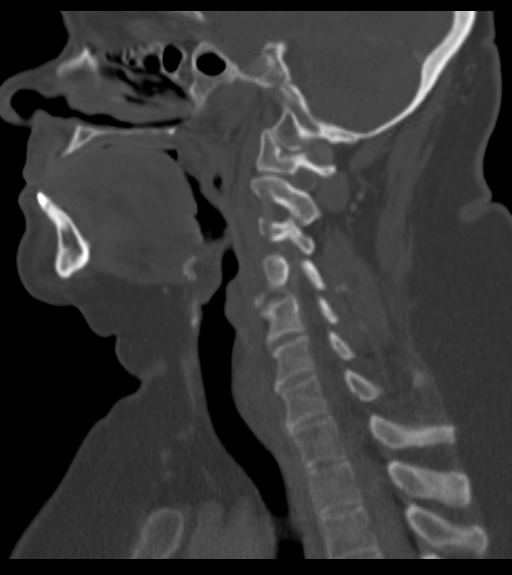
[im 69/104  bone]
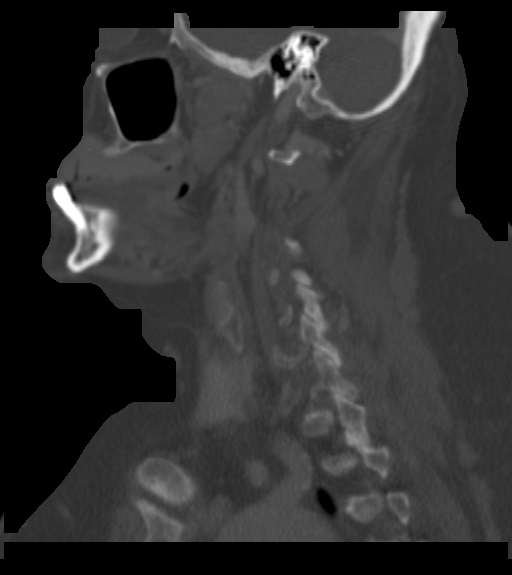

[Series 5: neck 2.0 soft tissue coro · coronal · 0.46mm/px · 3 of 98 slices shown]
[im 20/98  bone]
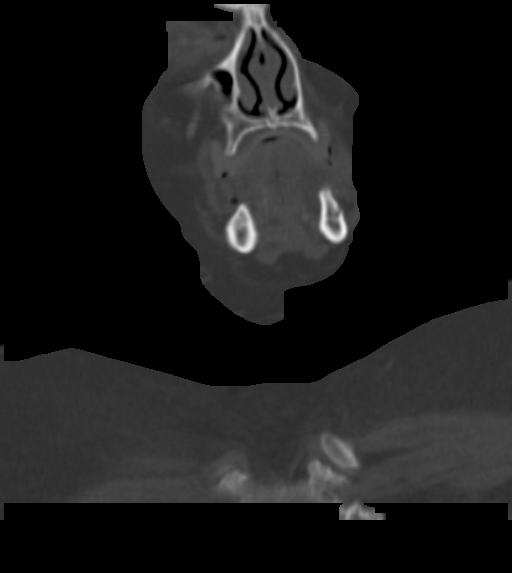
[im 39/98  bone]
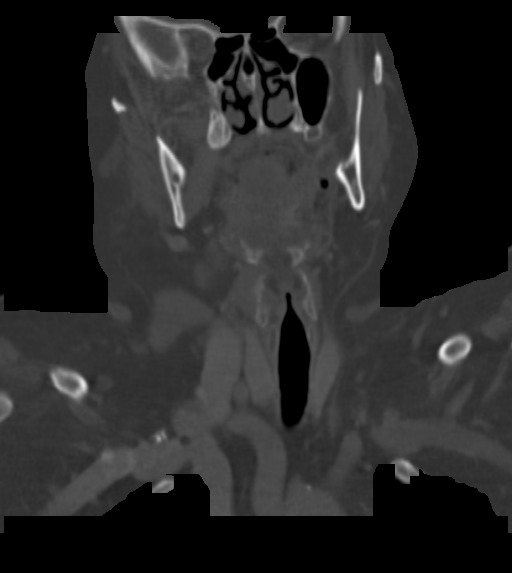
[im 59/98  bone]
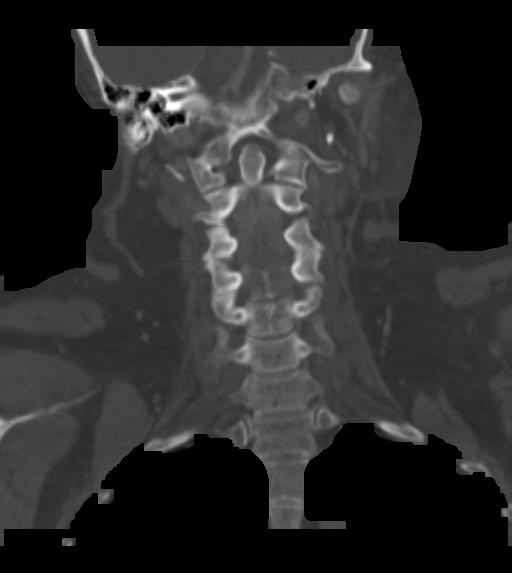

[Series 6: axial soft tissue neck 2.0 · axial · 0.46mm/px · 1 of 129 slices shown]
[im 22/129  soft-tissue]
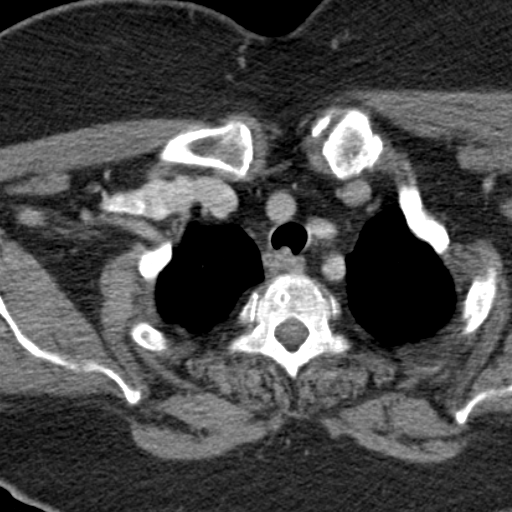

[14 of 33 positions shown; findings below may reference images not displayed]

FINDINGS: Left submandibular gland is absent consistent with the history of
previous neck surgery. There are no neck masses. There is a 1 cm
nodule that arises along the posterior margin of the left thyroid
that may reflect a thyroid nodule or be parathyroid in origin.
Remainder of thyroid gland is unremarkable. There are no
pathologically enlarged lymph nodes. Parotid glands and right
submandibular gland are unremarkable. Structures of the skullbase
are unremarkable. There are findings consistent with bilateral
cataract surgery. Globes and orbits are otherwise unremarkable. No
mucosal mass or significant asymmetry. Airway is widely patent.
Visualized upper lungs show interstitial thickening, presumed
chronic common but no consolidation, mass or nodule. Mild
degenerative changes are noted along the cervical spine. No central
stenosis or neural foraminal narrowing. No convincing disc
herniation. There are mild atherosclerotic changes along the right
carotid bifurcation and dominant right vertebral artery.
IMPRESSION: 1. No evidence of malignancy. Status post left submandibular gland
resection. No neck mass or adenopathy. No inflammatory changes.
2. 1 cm nodule noted along the posterior margin of the left thyroid
gland, which may be thyroid in origin or parathyroid. No followup
imaging is indicated unless there are findings of parathyroid gland
hyper function.
3. Mild degenerative changes noted along the cervical spine, but no
significant central stenosis and no evidence of neural foraminal
narrowing. No findings to explain left-sided neck pain with left
shoulder pain.

## 2015-05-16 ENCOUNTER — Ambulatory Visit: Payer: Medicare Other | Attending: Orthopedic Surgery | Admitting: Physical Therapy

## 2015-05-16 DIAGNOSIS — S76301D Unspecified injury of muscle, fascia and tendon of the posterior muscle group at thigh level, right thigh, subsequent encounter: Secondary | ICD-10-CM | POA: Insufficient documentation

## 2015-05-16 DIAGNOSIS — M25561 Pain in right knee: Secondary | ICD-10-CM | POA: Diagnosis present

## 2015-05-16 DIAGNOSIS — X58XXXD Exposure to other specified factors, subsequent encounter: Secondary | ICD-10-CM | POA: Diagnosis not present

## 2015-05-16 NOTE — Therapy (Signed)
[facility] [place], [place], [address] Phone: [phone]   Fax:  [phone]  Physical Therapy Evaluation  Patient Details  Name: Paula Steele MRN: [id number] Date of Birth: [birth year] Referring Provider:  Pill, Paula Nigh, MD  Encounter Date: 05/16/2015      PT End of Session - 05/16/15 1308    Visit Number 1   Number of Visits 12   Date for PT Re-Evaluation 06/27/15   PT Start Time 1118   PT Stop Time 1148   PT Time Calculation (min) 30 min   Activity Tolerance Patient tolerated treatment well   Behavior During Therapy [facility] for tasks assessed/performed      Past Medical History  Diagnosis Date  . Diverticulosis of colon (without mention of hemorrhage)   . Personal history of other diseases of digestive system     OCCASIONAL ABD. PAIN  . Other chronic nonalcoholic liver disease     SEES DR Paula Steele-- ?SCARRING OR CIRRHOSIS PER PET SCAN 2010  . Benign neoplasm of adrenal gland   . Benign neoplasm of colon   . Colon polyp   . Diabetes mellitus without mention of complication     INSULIN AND ORAL MEDS  . 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 Hyperlipidemia   . Vitamin D deficiency   . Neck pain, chronic S/P RADIAL NECK DISSECTION--  2007    LEFT SIDE OF NECK AND SHOULDER PAIN  . Insomnia DUE TO STRESS  . Shortness of breath on exertion   . Esophageal reflux     CONTROLLED W/ PRILOSEC  . Oropharyngeal dysphagia MILD RIGHT SUPRAGLOTTIC REGION    PT STATES SHE EATS VERY SLOWLY AND CHEWS FOOD WELL  . Weakness of both legs   . Degeneration of intervertebral disc, site unspecified     LUMBAR  . Arthritis      SHOULDERS--   IS CURRENTLY HAS LEFT SHOULDER PAIN  . 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 Left arm pain     and  heaviness since radical neck surg  . Chronic meniscal tear of knee,  right   . Urgency incontinence   . Blood transfusion   . Hx: UTI (urinary tract infection)   . Chronic kidney disease     bladder incontinence   . Bronchitis     10/2012- followed by ER and then PCP- Dr Paula Steele   . Shortness of breath   . Hypertension     Past Surgical History  Procedure Laterality Date  . Shoulder surgery  1990    LEFT  . Laparoscopic salpingoopherectomy  2001    RIGHT  W/ LYSIS AHESIONS  . Abdominal hysterectomy  1984    W/  LSO  . Neck mass excision  2007    LEFT  . Radical neck dissection  2007    W/ LEFT TONSILLECTOMY AND LEFT EXCISION LEFT TONGUE BASE DUE TO CANCER  . Direct laryngoscopy  2007    W/ LEFT NECK MASS EXCISION  . Tonsillectomy  01-31-2009    RIGHT (DUE TO MASS)  . Tonsillectomy  2007    LEFT W/ NECK DISSECTION DUE TO CANCER  . Cholecystectomy  15 YRS AGO  . Excision left sebacous cyst  05-14-2011 W/  LOCAL  . Cataract extraction w/ intraocular lens  implant, bilateral    .  Knee arthroscopy  09/26/2011    Procedure: ARTHROSCOPY KNEE;  Surgeon: Paula Steele;  Location: [place];  Service: Orthopedics;  Laterality: Right;  right knee arthroscopy with meniscal tear  . Knee arthroscopy  03/25/2012    Procedure: ARTHROSCOPY KNEE;  Surgeon: Paula Alf, MD;  Location: [facility];  Service: Orthopedics;  Laterality: Right;  With Debridement of medial lateral meniscus  . Total knee arthroplasty Right 11/30/2012    Procedure: TOTAL KNEE ARTHROPLASTY;  Surgeon: Paula Alf, MD;  Location: [facility];  Service: Orthopedics;  Laterality: Right;    There were no vitals filed for this visit.  Visit Diagnosis:  Hamstring injury, right, subsequent encounter - Plan: PT plan of care cert/re-cert  Right knee pain - Plan: PT plan of care cert/re-cert      Subjective Assessment - 05/16/15 1240    Subjective Knee still hurts after surgery and now I have right buttock pain.   Limitations Sitting   How long can you sit comfortably? 10-15  minutes.   How long can you stand comfortably? 10-15 minutes.   Patient Stated Goals Get out of pain.   Currently in Pain? Yes   Pain Score 6    Pain Location Knee   Pain Orientation Right   Pain Descriptors / Indicators Aching;Throbbing   Pain Type Surgical pain   Pain Onset More than a month ago   Pain Frequency Constant   Aggravating Factors  Sitting and standing.            [facility] PT Assessment - 05/16/15 0001    Assessment   Medical Diagnosis S/p right knee scope/Right hamstring strain.   Onset Date/Surgical Date --  03/14/15.   Precautions   Precautions --  No ultrasound.   Restrictions   Weight Bearing Restrictions No   Balance Screen   Has the patient fallen in the past 6 months Yes   Has the patient had a decrease in activity level because of a fear of falling?  Yes   Is the patient reluctant to leave their home because of a fear of falling?  Yes   [name] residence   Prior Function   Level of Independence Independent   Observation/Other Assessments-Edema    Edema --  Minimal right knee edema.   ROM / Strength   AROM / PROM / Strength AROM;PROM;Strength   AROM   Overall AROM Comments Full active right knee extension with active flexion to 115 degrees.   PROM   Overall PROM Comments 120 degrees of right knee flexion passively.   Strength   Overall Strength Comments Notable right quadriceps atrophy.   Palpation   Palpation comment Mild right knee incisional site tenderness and very tender over right ischial tuberosity.   Ambulation/Gait   Gait Comments Notable gait antalgia.                             PT Short Term Goals - 05/16/15 1312    PT SHORT TERM GOAL #1   Title Ind with HEP.           PT Long Term Goals - 05/16/15 1312    PT LONG TERM GOAL #1   Title Sit 30 minutes with pain not > 3/10.   Time 4   Period Weeks   Status New   PT LONG TERM GOAL #2   Title Stand 30 minutes with  pain not > 3/10.   Time 4   Period Weeks   Status New   PT LONG TERM GOAL #3   Title Walk with antalgia.   Time 4   Period Weeks   Status New   PT LONG TERM GOAL #4   Title Perform ADL's with pain not > 3/10.   Time 4   Period Weeks   Status New               Plan - 2015-06-15 1309    Clinical Impression Statement The patient had a total knee replacement thta continue to hurt.  She then underwent a right knee scope surgery and scar tissue was removed on 03/14/15.  She rates her pain at 6/10 and also has pain into her right buttock due to a hamstring strain.  Sitting and standing inctrease her pain.   Pt will benefit from skilled therapeutic intervention in order to improve on the following deficits Abnormal gait;Decreased activity tolerance;Decreased range of motion;Decreased strength;Pain   Rehab Potential Good   PT Frequency 3x / week   PT Duration 4 weeks   PT Treatment/Interventions ADLs/Self Care Home Management;Cryotherapy;Software engineer;Therapeutic exercise;Neuromuscular re-education;Patient/family education;Manual techniques;Passive range of motion   PT Next Visit Plan STW/M to right ischial tuberosity.  ight knee strengthening.          G-Codes - 2015-06-15 1314    Functional Assessment Tool Used FOTO.   Functional Limitation Mobility: Walking and moving around   Mobility: Walking and Moving Around Current Status [phone]) At least 60 percent but less than 80 percent impaired, limited or restricted   Mobility: Walking and Moving Around Goal Status [phone]) At least 20 percent but less than 40 percent impaired, limited or restricted       Problem List Patient Active Problem List   Diagnosis Date Noted  . Shortness of breath 12/30/2013  . Hyperlipidemia 12/30/2013  . Acute encephalopathy 12/07/2012  . CVA (cerebral infarction) 12/07/2012  . OA (osteoarthritis) of knee 11/30/2012  . Acute medial meniscal tear 03/25/2012  . Personal  history of colonic polyps 08/15/2011  . Abdominal pain, left lower quadrant 08/15/2011  . Cirrhosis of liver without mention of alcohol 08/15/2011  . Obesity 08/15/2011  . 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 DM 04/05/2008  . DEPRESSION 04/05/2008  . CAD 04/05/2008  . GERD 04/05/2008  . GASTROPARESIS 04/05/2008  . OTHER CHRONIC NONALCOHOLIC LIVER DISEASE 86/16/8372  . [place] DISEASE 04/05/2008  . BENIGN NEOPLASM OF ADRENAL GLAND 03/31/2007  . DIVERTICULOSIS OF COLON 09/06/2004  . COLONIC POLYPS 02/07/2003    [place], [place]  MPT  06-15-15, 1:18 PM  [facility] [address] [place], [place], [address] Phone: [phone]   Fax:  [phone]

## 2015-05-23 ENCOUNTER — Ambulatory Visit: Payer: Medicare Other | Attending: Orthopedic Surgery | Admitting: *Deleted

## 2015-05-23 ENCOUNTER — Encounter: Payer: Self-pay | Admitting: *Deleted

## 2015-05-23 DIAGNOSIS — S76301D Unspecified injury of muscle, fascia and tendon of the posterior muscle group at thigh level, right thigh, subsequent encounter: Secondary | ICD-10-CM | POA: Insufficient documentation

## 2015-05-23 DIAGNOSIS — X58XXXD Exposure to other specified factors, subsequent encounter: Secondary | ICD-10-CM | POA: Diagnosis not present

## 2015-05-23 DIAGNOSIS — M25561 Pain in right knee: Secondary | ICD-10-CM

## 2015-05-23 DIAGNOSIS — Z96651 Presence of right artificial knee joint: Secondary | ICD-10-CM | POA: Insufficient documentation

## 2015-05-23 NOTE — Therapy (Signed)
[facility] [place], [place], [address] Phone: [phone]   Fax:  [phone]  Physical Therapy Treatment  Patient Details  Name: Paula Steele MRN: [id number] Date of Birth: 02/02/1946 Referring Provider:  Octavio Graves, DO  Encounter Date: 05/23/2015      PT End of Session - 05/23/15 1208    Visit Number 2   Number of Visits 12   Date for PT Re-Evaluation 06/27/15   PT Start Time 1122   PT Stop Time 1209   PT Time Calculation (min) 47 min      Past Medical History  Diagnosis Date  . Diverticulosis of colon (without mention of hemorrhage)   . Personal history of other diseases of digestive system     OCCASIONAL ABD. PAIN  . Other chronic nonalcoholic liver disease     SEES DR Deatra Ina-- ?SCARRING OR CIRRHOSIS PER PET SCAN 2010  . Benign neoplasm of adrenal gland   . Benign neoplasm of colon   . Colon polyp   . Diabetes mellitus without mention of complication     INSULIN AND ORAL MEDS  . 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 Hyperlipidemia   . Vitamin D deficiency   . Neck pain, chronic S/P RADIAL NECK DISSECTION--  2007    LEFT SIDE OF NECK AND SHOULDER PAIN  . Insomnia DUE TO STRESS  . Shortness of breath on exertion   . Esophageal reflux     CONTROLLED W/ PRILOSEC  . Oropharyngeal dysphagia MILD RIGHT SUPRAGLOTTIC REGION    PT STATES SHE EATS VERY SLOWLY AND CHEWS FOOD WELL  . Weakness of both legs   . Degeneration of intervertebral disc, site unspecified     LUMBAR  . Arthritis      SHOULDERS--   IS CURRENTLY HAS LEFT SHOULDER PAIN  . 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 Left arm pain     and  heaviness since radical neck surg  . Chronic meniscal tear of knee, right   . Urgency incontinence   . Blood transfusion   . Hx: UTI (urinary tract infection)   . Chronic kidney  disease     bladder incontinence   . Bronchitis     10/2012- followed by ER and then PCP- Dr Melina Copa   . Shortness of breath   . Hypertension     Past Surgical History  Procedure Laterality Date  . Shoulder surgery  1990    LEFT  . Laparoscopic salpingoopherectomy  2001    RIGHT  W/ LYSIS AHESIONS  . Abdominal hysterectomy  1984    W/  LSO  . Neck mass excision  2007    LEFT  . Radical neck dissection  2007    W/ LEFT TONSILLECTOMY AND LEFT EXCISION LEFT TONGUE BASE DUE TO CANCER  . Direct laryngoscopy  2007    W/ LEFT NECK MASS EXCISION  . Tonsillectomy  01-31-2009    RIGHT (DUE TO MASS)  . Tonsillectomy  2007    LEFT W/ NECK DISSECTION DUE TO CANCER  . Cholecystectomy  15 YRS AGO  . Excision left sebacous cyst  05-14-2011 W/  LOCAL  . Cataract extraction w/ intraocular lens  implant, bilateral    . Knee arthroscopy  09/26/2011    Procedure: ARTHROSCOPY KNEE;  Surgeon: Gearlean Alf;  Location: [place];  Service: Orthopedics;  Laterality: Right;  right knee arthroscopy with meniscal tear  . Knee arthroscopy  03/25/2012    Procedure: ARTHROSCOPY KNEE;  Surgeon: Gearlean Alf, MD;  Location: [facility];  Service: Orthopedics;  Laterality: Right;  With Debridement of medial lateral meniscus  . Total knee arthroplasty Right 11/30/2012    Procedure: TOTAL KNEE ARTHROPLASTY;  Surgeon: Gearlean Alf, MD;  Location: [facility];  Service: Orthopedics;  Laterality: Right;    There were no vitals filed for this visit.  Visit Diagnosis:  Hamstring injury, right, subsequent encounter  Right knee pain      Subjective Assessment - 05/23/15 1129    Subjective Knee still hurts after surgery and now I have right buttock pain.   Limitations Sitting   How long can you sit comfortably? 10-15 minutes.   How long can you stand comfortably? 10-15 minutes.   Patient Stated Goals Get out of pain.   Currently in Pain? Yes                         [facility] Adult PT  Treatment/Exercise - 05/23/15 0001    Modalities   Modalities Electrical Stimulation;Moist Heat   Moist Heat Therapy   Number Minutes Moist Heat 15 Minutes   Moist Heat Location Hip   Electrical Stimulation   Electrical Stimulation Location RT ischial tuberosity and HS Premod 80-150hz  x 15 mins in LT hooklying   Electrical Stimulation Goals Pain   Manual Therapy   Manual Therapy Soft tissue mobilization;Myofascial release   Soft tissue mobilization STW to RT ischial tuberosity and down entire HS length and along ITB with pt LT sidelying   Myofascial Release IASTM to RT entire Posterior thigh with pt. LT sidelying                  PT Short Term Goals - 05/16/15 1312    PT SHORT TERM GOAL #1   Title Ind with HEP.           PT Long Term Goals - 05/16/15 1312    PT LONG TERM GOAL #1   Title Sit 30 minutes with pain not > 3/10.   Time 4   Period Weeks   Status New   PT LONG TERM GOAL #2   Title Stand 30 minutes with pain not > 3/10.   Time 4   Period Weeks   Status New   PT LONG TERM GOAL #3   Title Walk with antalgia.   Time 4   Period Weeks   Status New   PT LONG TERM GOAL #4   Title Perform ADL's with pain not > 3/10.   Time 4   Period Weeks   Status New               Plan - 05/23/15 1209    Clinical Impression Statement The Pt did fairly well today with STW and IASTM to Ischial tuberosity and HS's. She was very tender to iscial tuberosity and along HS's and ITB. Decreased tightness and soreness after Rx   Pt will benefit from skilled therapeutic intervention in order to improve on the following deficits Abnormal gait;Decreased activity tolerance;Decreased range of motion;Decreased strength;Pain   Rehab Potential Good   PT Frequency 3x / week   PT Duration 4 weeks   PT Treatment/Interventions ADLs/Self Care Home Management;Cryotherapy;Software engineer;Therapeutic exercise;Neuromuscular re-education;Patient/family  education;Manual techniques;Passive range of motion   PT Next  Visit Plan STW/M to right ischial tuberosity.  ight knee strengthening.   Consulted and Agree with Plan of Care Patient        Problem List Patient Active Problem List   Diagnosis Date Noted  . Shortness of breath 12/30/2013  . Hyperlipidemia 12/30/2013  . Acute encephalopathy 12/07/2012  . CVA (cerebral infarction) 12/07/2012  . OA (osteoarthritis) of knee 11/30/2012  . Acute medial meniscal tear 03/25/2012  . Personal history of colonic polyps 08/15/2011  . Abdominal pain, left lower quadrant 08/15/2011  . Cirrhosis of liver without mention of alcohol 08/15/2011  . Obesity 08/15/2011  . 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 DM 04/05/2008  . DEPRESSION 04/05/2008  . CAD 04/05/2008  . GERD 04/05/2008  . GASTROPARESIS 04/05/2008  . OTHER CHRONIC NONALCOHOLIC LIVER DISEASE 01/65/5374  . [place] DISEASE 04/05/2008  . BENIGN NEOPLASM OF ADRENAL GLAND 03/31/2007  . DIVERTICULOSIS OF COLON 09/06/2004  . COLONIC POLYPS 02/07/2003    Zaion Hreha,CHRIS,PTA 05/23/2015, 12:15 PM  [facility] [address] [place], [place], [address] Phone: [phone]   Fax:  [phone]

## 2015-05-25 ENCOUNTER — Ambulatory Visit: Payer: Medicare Other | Admitting: Physical Therapy

## 2015-05-25 DIAGNOSIS — S76301D Unspecified injury of muscle, fascia and tendon of the posterior muscle group at thigh level, right thigh, subsequent encounter: Secondary | ICD-10-CM | POA: Diagnosis not present

## 2015-05-25 DIAGNOSIS — M25561 Pain in right knee: Secondary | ICD-10-CM

## 2015-05-25 NOTE — Therapy (Signed)
[facility] [place], [place], [address] Phone: [phone]   Fax:  [phone]  Physical Therapy Treatment  Patient Details  Name: Paula Steele MRN: [id number] Date of Birth: April 25, 1946 Referring Provider:  Octavio Graves, DO  Encounter Date: 05/25/2015      PT End of Session - 05/25/15 1412    Visit Number 3   Number of Visits 12   Date for PT Re-Evaluation 06/27/15   PT Start Time 0110   PT Stop Time 0152   PT Time Calculation (min) 42 min   Activity Tolerance Patient tolerated treatment well   Behavior During Therapy [facility] for tasks assessed/performed      Past Medical History  Diagnosis Date  . Diverticulosis of colon (without mention of hemorrhage)   . Personal history of other diseases of digestive system     OCCASIONAL ABD. PAIN  . Other chronic nonalcoholic liver disease     SEES DR Deatra Ina-- ?SCARRING OR CIRRHOSIS PER PET SCAN 2010  . Benign neoplasm of adrenal gland   . Benign neoplasm of colon   . Colon polyp   . Diabetes mellitus without mention of complication     INSULIN AND ORAL MEDS  . 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 Hyperlipidemia   . Vitamin D deficiency   . Neck pain, chronic S/P RADIAL NECK DISSECTION--  2007    LEFT SIDE OF NECK AND SHOULDER PAIN  . Insomnia DUE TO STRESS  . Shortness of breath on exertion   . Esophageal reflux     CONTROLLED W/ PRILOSEC  . Oropharyngeal dysphagia MILD RIGHT SUPRAGLOTTIC REGION    PT STATES SHE EATS VERY SLOWLY AND CHEWS FOOD WELL  . Weakness of both legs   . Degeneration of intervertebral disc, site unspecified     LUMBAR  . Arthritis      SHOULDERS--   IS CURRENTLY HAS LEFT SHOULDER PAIN  . 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 Left arm pain     and  heaviness since radical neck surg  . Chronic meniscal tear of knee, right    . Urgency incontinence   . Blood transfusion   . Hx: UTI (urinary tract infection)   . Chronic kidney disease     bladder incontinence   . Bronchitis     10/2012- followed by ER and then PCP- Dr Melina Copa   . Shortness of breath   . Hypertension     Past Surgical History  Procedure Laterality Date  . Shoulder surgery  1990    LEFT  . Laparoscopic salpingoopherectomy  2001    RIGHT  W/ LYSIS AHESIONS  . Abdominal hysterectomy  1984    W/  LSO  . Neck mass excision  2007    LEFT  . Radical neck dissection  2007    W/ LEFT TONSILLECTOMY AND LEFT EXCISION LEFT TONGUE BASE DUE TO CANCER  . Direct laryngoscopy  2007    W/ LEFT NECK MASS EXCISION  . Tonsillectomy  01-31-2009    RIGHT (DUE TO MASS)  . Tonsillectomy  2007    LEFT W/ NECK DISSECTION DUE TO CANCER  . Cholecystectomy  15 YRS AGO  . Excision left sebacous cyst  05-14-2011 W/  LOCAL  . Cataract extraction w/ intraocular lens  implant, bilateral    . Knee  arthroscopy  09/26/2011    Procedure: ARTHROSCOPY KNEE;  Surgeon: Gearlean Alf;  Location: [place];  Service: Orthopedics;  Laterality: Right;  right knee arthroscopy with meniscal tear  . Knee arthroscopy  03/25/2012    Procedure: ARTHROSCOPY KNEE;  Surgeon: Gearlean Alf, MD;  Location: [facility];  Service: Orthopedics;  Laterality: Right;  With Debridement of medial lateral meniscus  . Total knee arthroplasty Right 11/30/2012    Procedure: TOTAL KNEE ARTHROPLASTY;  Surgeon: Gearlean Alf, MD;  Location: [facility];  Service: Orthopedics;  Laterality: Right;    There were no vitals filed for this visit.  Visit Diagnosis:  Hamstring injury, right, subsequent encounter  Right knee pain      Subjective Assessment - 05/25/15 1412    Subjective Last treatment was very helpful.   Limitations Sitting   How long can you sit comfortably? 10-15 minutes.   How long can you stand comfortably? 10-15 minutes.   Patient Stated Goals Get out of pain.   Pain  Score 4    Pain Location Knee   Pain Orientation Right   Pain Descriptors / Indicators Aching;Throbbing   Pain Type Surgical pain   Pain Frequency Constant                         [facility] Adult PT Treatment/Exercise - 05/25/15 0001    Electrical Stimulation   Electrical Stimulation Location Right ischial tuberosity region.   Electrical Stimulation Action 80-150 HZ x 10 minutes.   Electrical Stimulation Goals Pain   Manual Therapy   Manual therapy comments Left sdly position with pillows between knees for comfort:  Patient received STW/M to affected right hamstring ischial tuberosity region x 23 minutes.                  PT Short Term Goals - 05/16/15 1312    PT SHORT TERM GOAL #1   Title Ind with HEP.           PT Long Term Goals - 05/16/15 1312    PT LONG TERM GOAL #1   Title Sit 30 minutes with pain not > 3/10.   Time 4   Period Weeks   Status New   PT LONG TERM GOAL #2   Title Stand 30 minutes with pain not > 3/10.   Time 4   Period Weeks   Status New   PT LONG TERM GOAL #3   Title Walk with antalgia.   Time 4   Period Weeks   Status New   PT LONG TERM GOAL #4   Title Perform ADL's with pain not > 3/10.   Time 4   Period Weeks   Status New               Problem List Patient Active Problem List   Diagnosis Date Noted  . Shortness of breath 12/30/2013  . Hyperlipidemia 12/30/2013  . Acute encephalopathy 12/07/2012  . CVA (cerebral infarction) 12/07/2012  . OA (osteoarthritis) of knee 11/30/2012  . Acute medial meniscal tear 03/25/2012  . Personal history of colonic polyps 08/15/2011  . Abdominal pain, left lower quadrant 08/15/2011  . Cirrhosis of liver without mention of alcohol 08/15/2011  . Obesity 08/15/2011  . 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 DM 04/05/2008  . DEPRESSION 04/05/2008  . CAD 04/05/2008  . GERD 04/05/2008  . GASTROPARESIS 04/05/2008  . OTHER CHRONIC NONALCOHOLIC LIVER DISEASE  16/10/930  .  [place] DISEASE 04/05/2008  . BENIGN NEOPLASM OF ADRENAL GLAND 03/31/2007  . DIVERTICULOSIS OF COLON 09/06/2004  . COLONIC POLYPS 02/07/2003    Laticia Vannostrand, [place] MPT 05/25/2015, 2:16 PM  [facility] [address] [place], [place], [address] Phone: [phone]   Fax:  [phone]

## 2015-05-30 ENCOUNTER — Ambulatory Visit: Payer: Medicare Other | Admitting: *Deleted

## 2015-05-31 ENCOUNTER — Encounter: Payer: Medicare Other | Admitting: Physical Therapy

## 2015-06-13 ENCOUNTER — Ambulatory Visit: Payer: Medicare Other | Admitting: *Deleted

## 2015-06-13 DIAGNOSIS — S76301D Unspecified injury of muscle, fascia and tendon of the posterior muscle group at thigh level, right thigh, subsequent encounter: Secondary | ICD-10-CM | POA: Diagnosis not present

## 2015-06-13 DIAGNOSIS — M25561 Pain in right knee: Secondary | ICD-10-CM

## 2015-06-13 NOTE — Therapy (Signed)
[facility] [place], [place], [address] Phone: [phone]   Fax:  [phone]  Physical Therapy Treatment  Patient Details  Name: Paula Steele MRN: [id number] Date of Birth: 08/19/1946 Referring Provider:  Octavio Graves, DO  Encounter Date: 06/13/2015      PT End of Session - 06/13/15 1310    Visit Number 4   Number of Visits 12   Date for PT Re-Evaluation 06/27/15   PT Start Time 0867   PT Stop Time 6195   PT Time Calculation (min) 51 min      Past Medical History  Diagnosis Date  . Diverticulosis of colon (without mention of hemorrhage)   . Personal history of other diseases of digestive system     OCCASIONAL ABD. PAIN  . Other chronic nonalcoholic liver disease     SEES DR Deatra Ina-- ?SCARRING OR CIRRHOSIS PER PET SCAN 2010  . Benign neoplasm of adrenal gland   . Benign neoplasm of colon   . Colon polyp   . Diabetes mellitus without mention of complication     INSULIN AND ORAL MEDS  . 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 Hyperlipidemia   . Vitamin D deficiency   . Neck pain, chronic S/P RADIAL NECK DISSECTION--  2007    LEFT SIDE OF NECK AND SHOULDER PAIN  . Insomnia DUE TO STRESS  . Shortness of breath on exertion   . Esophageal reflux     CONTROLLED W/ PRILOSEC  . Oropharyngeal dysphagia MILD RIGHT SUPRAGLOTTIC REGION    PT STATES SHE EATS VERY SLOWLY AND CHEWS FOOD WELL  . Weakness of both legs   . Degeneration of intervertebral disc, site unspecified     LUMBAR  . Arthritis      SHOULDERS--   IS CURRENTLY HAS LEFT SHOULDER PAIN  . 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 Left arm pain     and  heaviness since radical neck surg  . Chronic meniscal tear of knee, right   . Urgency incontinence   . Blood transfusion   . Hx: UTI (urinary tract infection)   . Chronic kidney  disease     bladder incontinence   . Bronchitis     10/2012- followed by ER and then PCP- Dr Melina Copa   . Shortness of breath   . Hypertension     Past Surgical History  Procedure Laterality Date  . Shoulder surgery  1990    LEFT  . Laparoscopic salpingoopherectomy  2001    RIGHT  W/ LYSIS AHESIONS  . Abdominal hysterectomy  1984    W/  LSO  . Neck mass excision  2007    LEFT  . Radical neck dissection  2007    W/ LEFT TONSILLECTOMY AND LEFT EXCISION LEFT TONGUE BASE DUE TO CANCER  . Direct laryngoscopy  2007    W/ LEFT NECK MASS EXCISION  . Tonsillectomy  01-31-2009    RIGHT (DUE TO MASS)  . Tonsillectomy  2007    LEFT W/ NECK DISSECTION DUE TO CANCER  . Cholecystectomy  15 YRS AGO  . Excision left sebacous cyst  05-14-2011 W/  LOCAL  . Cataract extraction w/ intraocular lens  implant, bilateral    . Knee arthroscopy  09/26/2011    Procedure: ARTHROSCOPY KNEE;  Surgeon: Gearlean Alf;  Location: [place];  Service: Orthopedics;  Laterality: Right;  right knee arthroscopy with meniscal tear  . Knee arthroscopy  03/25/2012    Procedure: ARTHROSCOPY KNEE;  Surgeon: Gearlean Alf, MD;  Location[facility];  Service: Orthopedics;  Laterality: Right;  With Debridement of medial lateral meniscus  . Total knee arthroplasty Right 11/30/2012    Procedure: TOTAL KNEE ARTHROPLASTY;  Surgeon: Gearlean Alf, MD;  Location[facility];  Service: Orthopedics;  Laterality: Right;    There were no vitals filed for this visit.  Visit Diagnosis:  Hamstring injury, right, subsequent encounter  Right knee pain      Subjective Assessment - 06/13/15 1311    Subjective RT knee is not doing well 8/10   Limitations Sitting   How long can you sit comfortably? 10-15 minutes.   How long can you stand comfortably? 10-15 minutes.   Patient Stated Goals Get out of pain.   Currently in Pain? Yes   Pain Score 8    Pain Location Knee   Pain Orientation Right   Pain Descriptors /  Indicators Aching;Throbbing   Pain Type Surgical pain   Pain Onset More than a month ago   Pain Frequency Constant   Aggravating Factors  sitting/standing                         [facility] Adult PT Treatment/Exercise - 06/13/15 0001    Modalities   Modalities Vasopneumatic;Electrical Stimulation   Electrical Stimulation   Electrical Stimulation Location RT Knee PREmod 80-150hz  x 15 min   Electrical Stimulation Goals Pain   Vasopneumatic   Number Minutes Vasopneumatic  15 minutes   Vasopnuematic Location  Knee   Vasopneumatic Pressure Medium   Vasopneumatic Temperature  47   Manual Therapy   Manual Therapy Soft tissue mobilization;Myofascial release   Manual therapy comments Hooklying   Myofascial Release IASTM to RT entire Knee and around incisions. Patella mobs                  PT Short Term Goals - 05/16/15 1312    PT SHORT TERM GOAL #1   Title Ind with HEP.           PT Long Term Goals - 05/16/15 1312    PT LONG TERM GOAL #1   Title Sit 30 minutes with pain not > 3/10.   Time 4   Period Weeks   Status New   PT LONG TERM GOAL #2   Title Stand 30 minutes with pain not > 3/10.   Time 4   Period Weeks   Status New   PT LONG TERM GOAL #3   Title Walk with antalgia.   Time 4   Period Weeks   Status New   PT LONG TERM GOAL #4   Title Perform ADL's with pain not > 3/10.   Time 4   Period Weeks   Status New               Plan - 06/13/15 1731    Clinical Impression Statement Pt did fair today, but continues to have pain in RT knee and HS. She wanted to focus on RT knee pain today and did have a little less pain after Rx. She was tender along medial aspect of RT knee. Goals are ongoing at this time   Pt will benefit from skilled therapeutic intervention in order to improve on the following deficits Abnormal gait;Decreased activity tolerance;Decreased range  of motion;Decreased strength;Pain   Rehab Potential Good   PT Frequency 3x /  week   PT Duration 4 weeks   PT Treatment/Interventions ADLs/Self Care Home Management;Cryotherapy;Software engineer;Therapeutic exercise;Neuromuscular re-education;Patient/family education;Manual techniques;Passive range of motion   PT Next Visit Plan STW/M to right ischial tuberosity.  ight knee strengthening if tolerated   Consulted and Agree with Plan of Care Patient        Problem List Patient Active Problem List   Diagnosis Date Noted  . Shortness of breath 12/30/2013  . Hyperlipidemia 12/30/2013  . Acute encephalopathy 12/07/2012  . CVA (cerebral infarction) 12/07/2012  . OA (osteoarthritis) of knee 11/30/2012  . Acute medial meniscal tear 03/25/2012  . Personal history of colonic polyps 08/15/2011  . Abdominal pain, left lower quadrant 08/15/2011  . Cirrhosis of liver without mention of alcohol 08/15/2011  . Obesity 08/15/2011  . 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 DM 04/05/2008  . DEPRESSION 04/05/2008  . CAD 04/05/2008  . GERD 04/05/2008  . GASTROPARESIS 04/05/2008  . OTHER CHRONIC NONALCOHOLIC LIVER DISEASE 53/29/9242  . [place] DISEASE 04/05/2008  . BENIGN NEOPLASM OF ADRENAL GLAND 03/31/2007  . DIVERTICULOSIS OF COLON 09/06/2004  . COLONIC POLYPS 02/07/2003    RAMSEUR,CHRIS, PTA 06/13/2015, 5:44 PM  [facility] [address] [place], [place], [address] Phone: [phone]   Fax:  [phone]

## 2015-06-15 ENCOUNTER — Encounter: Payer: Medicare Other | Admitting: *Deleted

## 2015-06-20 ENCOUNTER — Encounter: Payer: Self-pay | Admitting: *Deleted

## 2015-06-20 ENCOUNTER — Ambulatory Visit: Payer: Medicare Other | Admitting: *Deleted

## 2015-06-20 DIAGNOSIS — S76301D Unspecified injury of muscle, fascia and tendon of the posterior muscle group at thigh level, right thigh, subsequent encounter: Secondary | ICD-10-CM | POA: Diagnosis not present

## 2015-06-20 DIAGNOSIS — M25561 Pain in right knee: Secondary | ICD-10-CM

## 2015-06-20 NOTE — Therapy (Addendum)
[facility] [address] [place], [place], [address] Phone: [phone]   Fax:  [phone]  Physical Therapy Treatment  Patient Details  Name: Paula Steele MRN: [id number] Date of Birth: [birth year] Referring Provider:  Samuel Jester, DO  Encounter Date: 06/20/2015    Past Medical History:  Diagnosis Date  . Arthritis     SHOULDERS--   IS CURRENTLY HAS LEFT SHOULDER PAIN  . Benign neoplasm of adrenal gland   . Benign neoplasm of colon   . Blood transfusion   . Bronchitis    10/2012- followed by ER and then PCP- Dr Charm Barges   . Chronic kidney disease    bladder incontinence   . Chronic meniscal tear of knee, right   . Colon polyp   . Degeneration of intervertebral disc, site unspecified    LUMBAR  . Diabetes mellitus without mention of complication    INSULIN AND ORAL MEDS  . Diverticulosis of colon (without mention of hemorrhage)   . Esophageal reflux    CONTROLLED W/ PRILOSEC  . History of neck pain    chronic left sided neck pain  . Hx: UTI (urinary tract infection)   . Hyperlipidemia   . Hypertension   . Insomnia DUE TO STRESS  . Left arm pain    and  heaviness since radical neck surg  . Neck pain, chronic S/P RADIAL NECK DISSECTION--  2007   LEFT SIDE OF NECK AND SHOULDER PAIN  . Oropharyngeal dysphagia MILD RIGHT SUPRAGLOTTIC REGION   PT STATES SHE EATS VERY SLOWLY AND CHEWS FOOD WELL  . Other chronic nonalcoholic liver disease    SEES DR Arlyce Dice-- ?SCARRING OR CIRRHOSIS PER PET SCAN 2010  . Personal history of other diseases of digestive system    OCCASIONAL ABD. PAIN  . 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 Shortness of breath   . Shortness of breath on exertion   . 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 Urgency incontinence   . Vitamin D deficiency   . Weakness of both legs      Past Surgical History:  Procedure Laterality Date  . ABDOMINAL HYSTERECTOMY  1984   W/  LSO  . CATARACT EXTRACTION W/ INTRAOCULAR LENS  IMPLANT, BILATERAL    . CHOLECYSTECTOMY  15 YRS AGO  . DIRECT LARYNGOSCOPY  2007   W/ LEFT NECK MASS EXCISION  . EXCISION LEFT SEBACOUS CYST  05-14-2011 W/  LOCAL  . KNEE ARTHROSCOPY  09/26/2011   Procedure: ARTHROSCOPY KNEE;  Surgeon: Loanne Drilling;  Location: [facility];  Service: Orthopedics;  Laterality: Right;  right knee arthroscopy with meniscal tear  . KNEE ARTHROSCOPY  03/25/2012   Procedure: ARTHROSCOPY KNEE;  Surgeon: Loanne Drilling, MD;  [facility];  Service: Orthopedics;  Laterality: Right;  With Debridement of medial lateral meniscus  . LAPAROSCOPIC SALPINGOOPHERECTOMY  2001   RIGHT  W/ LYSIS AHESIONS  . NECK MASS EXCISION  2007   LEFT  . RADICAL NECK DISSECTION  2007   W/ LEFT TONSILLECTOMY AND LEFT EXCISION LEFT TONGUE BASE DUE TO CANCER  . SHOULDER SURGERY  1990   LEFT  . TONSILLECTOMY  01-31-2009   RIGHT (DUE TO MASS)  . TONSILLECTOMY  2007   LEFT W/ NECK DISSECTION DUE TO CANCER  . TOTAL KNEE ARTHROPLASTY Right 11/30/2012  Procedure: TOTAL KNEE ARTHROPLASTY;  Surgeon: Gearlean Alf, MD;  Location[facility];  Service: Orthopedics;  Laterality: Right;    There were no vitals filed for this visit.  Visit Diagnosis:  Hamstring injury, right, subsequent encounter  Right knee pain                                 PT Short Term Goals - 05/16/15 1312      PT SHORT TERM GOAL #1   Title Ind with HEP.           PT Long Term Goals - 05/16/15 1312      PT LONG TERM GOAL #1   Title Sit 30 minutes with pain not > 3/10.   Time 4   Period Weeks   Status New     PT LONG TERM GOAL #2   Title Stand 30 minutes with pain not > 3/10.   Time 4   Period Weeks   Status New     PT LONG TERM GOAL #3   Title Walk with antalgia.   Time 4   Period Weeks   Status New     PT  LONG TERM GOAL #4   Title Perform ADL's with pain not > 3/10.   Time 4   Period Weeks   Status New               Problem List Patient Active Problem List   Diagnosis Date Noted  . Shortness of breath 12/30/2013  . Hyperlipidemia 12/30/2013  . Acute encephalopathy 12/07/2012  . CVA (cerebral infarction) 12/07/2012  . OA (osteoarthritis) of knee 11/30/2012  . Acute medial meniscal tear 03/25/2012  . Personal history of colonic polyps 08/15/2011  . Abdominal pain, left lower quadrant 08/15/2011  . Cirrhosis of liver without mention of alcohol 08/15/2011  . Obesity 08/15/2011  . 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 DM 04/05/2008  . DEPRESSION 04/05/2008  . CAD 04/05/2008  . GERD 04/05/2008  . GASTROPARESIS 04/05/2008  . OTHER CHRONIC NONALCOHOLIC LIVER DISEASE 75/64/3329  . [place] DISEASE 04/05/2008  . BENIGN NEOPLASM OF ADRENAL GLAND 03/31/2007  . DIVERTICULOSIS OF COLON 09/06/2004  . COLONIC POLYPS 02/07/2003    APPLEGATE, [place], PTA 08/26/2016, 2:06 PM  [facility] [place], [place], [address] Phone: [phone]   Fax:  [phone]  PHYSICAL THERAPY DISCHARGE SUMMARY  Visits from Start of Care: 5.  Current functional level related to goals / functional outcomes: Please see above.   Remaining deficits: Continued pain and gait antalgia.   Education / Equipment: HEP. Plan: Patient agrees to discharge.  Patient goals were not met. Patient is being discharged due to not returning since the last visit.  ?????         Mali Applegate MPT

## 2015-06-22 ENCOUNTER — Encounter: Payer: Medicare Other | Admitting: *Deleted

## 2015-11-10 ENCOUNTER — Ambulatory Visit (INDEPENDENT_AMBULATORY_CARE_PROVIDER_SITE_OTHER): Payer: Medicare Other | Admitting: *Deleted

## 2015-11-10 VITALS — HR 88

## 2015-11-10 DIAGNOSIS — R9431 Abnormal electrocardiogram [ECG] [EKG]: Secondary | ICD-10-CM

## 2015-11-10 DIAGNOSIS — R002 Palpitations: Secondary | ICD-10-CM

## 2015-11-10 NOTE — Patient Instructions (Addendum)
Follow up with Cecille Rubin on Monday. Report to ER if symptoms return over weekend.

## 2015-11-10 NOTE — Progress Notes (Signed)
Patient walked in today after orthopedic appointment nextdoor. States SOB and feeling like her heart is racing.   She states no cardiac history, however review of chart shows she was seen by Dr. Gwenlyn Found in March of 2015 and evaluated for dyspnea. She had been advised to undergo stress test and echo, however, i do not see that she returned to have these tests.  I brought patient back to a room for nurse eval. Urban Gibson performed EKG. Dr. Oval Linsey gave advisement after interpretation of EKG - no acute concerns - sust sinus arrythmia attributed to lung history.  Recommendation by Dr. Oval Linsey was to offer patient add-on with her at end of day if patient wanted to wait - otherwise, have patient follow up next week with mid-level provider.  I spoke with patient. At this time ~15-20 minutes had passed since time of patient walk-in and she noted improvement of symptoms. She was advised on her options and elected to follow up with provider next week. We discussed concerns and her symptoms in detail, advised if worse over weekend that it would be appropriate to seek emergency evaluation if her symptoms become worse. Pt agreeable to plan. I spoke to [place] at [facility] who scheduled patient on [place] calendar for 9am Monday.

## 2015-11-13 ENCOUNTER — Ambulatory Visit: Payer: Medicare Other | Admitting: Nurse Practitioner

## 2015-11-24 ENCOUNTER — Encounter: Payer: Self-pay | Admitting: Nurse Practitioner

## 2016-01-23 ENCOUNTER — Ambulatory Visit (INDEPENDENT_AMBULATORY_CARE_PROVIDER_SITE_OTHER): Payer: Medicare Other | Admitting: Cardiovascular Disease

## 2016-01-23 ENCOUNTER — Encounter: Payer: Self-pay | Admitting: Cardiovascular Disease

## 2016-01-23 VITALS — BP 126/72 | HR 64 | Ht 69.0 in | Wt 258.0 lb

## 2016-01-23 DIAGNOSIS — E785 Hyperlipidemia, unspecified: Secondary | ICD-10-CM

## 2016-01-23 NOTE — Progress Notes (Signed)
01/23/2016 Paula Steele   [birth year]  [id number]  Primary Physician Octavio Graves, DO Primary Cardiologist: Lorretta Harp MD Renae Gloss   HPI:  Paula Steele is a 70year old moderately overweight married Caucasian female mother of 4 children, grandmother of 49 grandchildren referred by Dr. Octavio Graves and her Cherry for cardiovascular evaluation because of a 6 month history of increasing shortness of breath. I last saw her in the office 12/30/13. Her cardiac risk factor profile is marginal for a strong family history heart disease with several brothers have had open heart surgery as well as a father who died of a myocardial infarction at age 74. She smoked remotely and quit 20 years ago probably smoking 15-20 pack years. Her other problems include history of hyperlipidemia and diabetes. She's never had a heart attack or stroke.  I apparently ordered a Myoview stress test and 2-D echo none of which were performed. Since I saw her back in 2015 she really denies chest pain or shortness of breath.   Current Outpatient Prescriptions  Medication Sig Dispense Refill  . albuterol (PROVENTIL HFA;VENTOLIN HFA) 108 (90 BASE) MCG/ACT inhaler Inhale 2 puffs into the lungs every 6 (six) hours as needed. Wheezing    . diazepam (VALIUM) 10 MG tablet Take 0.5 tablets (5 mg total) by mouth every 12 (twelve) hours as needed for anxiety. Muscle spasms 5 tablet 0  . gabapentin (NEURONTIN) 300 MG capsule Take 300 mg by mouth at bedtime.    Marland Kitchen glimepiride (AMARYL) 4 MG tablet Take 4 mg by mouth 2 (two) times daily.     Marland Kitchen HYDROcodone-acetaminophen (NORCO/VICODIN) 5-325 MG per tablet Take 1 tablet by mouth every 6 (six) hours as needed for pain. 1 tablet 0  . ibuprofen (ADVIL,MOTRIN) 800 MG tablet Take 800 mg by mouth every 8 (eight) hours as needed.    . insulin glargine (LANTUS) 100 UNIT/ML injection Inject 60 Units into the skin 2 (two) times daily. Sliding scale in the mornings    . loratadine  (CLARITIN) 10 MG tablet Take 10 mg by mouth daily.    Marland Kitchen NOVOLOG FLEXPEN 100 UNIT/ML injection Inject 12-38 Units into the skin 3 (three) times daily. PER Sliding scale    . promethazine (PHENERGAN) 25 MG tablet Take 25 mg by mouth every 6 (six) hours as needed for nausea or vomiting.    . rosuvastatin (CRESTOR) 5 MG tablet Take 5 mg by mouth daily.    . sitaGLIPtin (JANUVIA) 100 MG tablet Take 100 mg by mouth every morning.    Marland Kitchen tiZANidine (ZANAFLEX) 4 MG tablet Take 4 mg by mouth at bedtime. As needed    . triamcinolone (KENALOG) 0.1 % cream Apply 1 application topically 2 (two) times daily as needed. itching    . Vitamin D, Ergocalciferol, (DRISDOL) 50000 UNITS CAPS Take 50,000 Units by mouth every Sunday.      No current facility-administered medications for this visit.    Allergies  Allergen Reactions  . Actos [Pioglitazone]     edema  . Bydureon [Exenatide]     nausea  . Metformin And Related     diahrrhea    Social History   Social History  . Marital Status: Married    Spouse Name: N/A  . Number of Children: 4  . Years of Education: N/A   Occupational History  . Disabled    Social History Main Topics  . Smoking status: Former Smoker -- 15 years    Types: Cigarettes  Quit date: 09/22/1984  . Smokeless tobacco: Never Used  . Alcohol Use: No  . Drug Use: No  . Sexual Activity: Not Currently   Other Topics Concern  . Not on file   Social History Narrative     Review of Systems: General: negative for chills, fever, night sweats or weight changes.  Cardiovascular: negative for chest pain, dyspnea on exertion, edema, orthopnea, palpitations, paroxysmal nocturnal dyspnea or shortness of breath Dermatological: negative for rash Respiratory: negative for cough or wheezing Urologic: negative for hematuria Abdominal: negative for nausea, vomiting, diarrhea, bright red blood per rectum, melena, or hematemesis Neurologic: negative for visual changes, syncope, or  dizziness All other systems reviewed and are otherwise negative except as noted above.    Blood pressure 126/72, pulse 64, height 5\' 9"  (1.753 m), weight 258 lb (117.028 kg).  General appearance: alert and no distress Neck: no adenopathy, no carotid bruit, no JVD, supple, symmetrical, trachea midline and thyroid not enlarged, symmetric, no tenderness/mass/nodules Lungs: clear to auscultation bilaterally Heart: regular rate and rhythm, S1, S2 normal, no murmur, click, rub or gallop Extremities: extremities normal, atraumatic, no cyanosis or edema  EKG not performed today  ASSESSMENT AND PLAN:   Hyperlipidemia History of hyperlipidemia on statin therapy followed by her PCP      Lorretta Harp MD [facility], [facility] 01/23/2016 12:09 PM

## 2016-01-23 NOTE — Assessment & Plan Note (Signed)
History of hyperlipidemia on statin therapy followed by her PCP. 

## 2016-01-23 NOTE — Patient Instructions (Signed)
Your physician recommends that you schedule a follow-up appointment in: As Needed    

## 2016-03-12 ENCOUNTER — Other Ambulatory Visit: Payer: Self-pay | Admitting: Physician Assistant

## 2016-03-12 DIAGNOSIS — D3502 Benign neoplasm of left adrenal gland: Secondary | ICD-10-CM

## 2016-03-21 ENCOUNTER — Inpatient Hospital Stay: Admission: RE | Admit: 2016-03-21 | Payer: Medicare Other | Source: Ambulatory Visit

## 2016-04-01 ENCOUNTER — Encounter: Payer: Self-pay | Admitting: Gastroenterology

## 2016-04-09 ENCOUNTER — Encounter (HOSPITAL_COMMUNITY): Payer: Self-pay | Admitting: Emergency Medicine

## 2016-04-09 ENCOUNTER — Other Ambulatory Visit: Payer: Self-pay

## 2016-04-09 ENCOUNTER — Emergency Department (HOSPITAL_COMMUNITY): Payer: Medicare Other

## 2016-04-09 ENCOUNTER — Emergency Department (HOSPITAL_COMMUNITY)
Admission: EM | Admit: 2016-04-09 | Discharge: 2016-04-09 | Disposition: A | Discharge status: Home / Self Care | Payer: Medicare Other | Attending: Emergency Medicine | Admitting: Emergency Medicine

## 2016-04-09 DIAGNOSIS — R197 Diarrhea, unspecified: Secondary | ICD-10-CM | POA: Insufficient documentation

## 2016-04-09 DIAGNOSIS — R05 Cough: Secondary | ICD-10-CM | POA: Insufficient documentation

## 2016-04-09 DIAGNOSIS — I129 Hypertensive chronic kidney disease with stage 1 through stage 4 chronic kidney disease, or unspecified chronic kidney disease: Secondary | ICD-10-CM | POA: Diagnosis not present

## 2016-04-09 DIAGNOSIS — E1122 Type 2 diabetes mellitus with diabetic chronic kidney disease: Secondary | ICD-10-CM | POA: Insufficient documentation

## 2016-04-09 DIAGNOSIS — Z79899 Other long term (current) drug therapy: Secondary | ICD-10-CM | POA: Diagnosis not present

## 2016-04-09 DIAGNOSIS — R531 Weakness: Secondary | ICD-10-CM | POA: Diagnosis present

## 2016-04-09 DIAGNOSIS — Z7984 Long term (current) use of oral hypoglycemic drugs: Secondary | ICD-10-CM | POA: Insufficient documentation

## 2016-04-09 DIAGNOSIS — R109 Unspecified abdominal pain: Secondary | ICD-10-CM | POA: Insufficient documentation

## 2016-04-09 DIAGNOSIS — R0602 Shortness of breath: Secondary | ICD-10-CM | POA: Diagnosis not present

## 2016-04-09 DIAGNOSIS — E785 Hyperlipidemia, unspecified: Secondary | ICD-10-CM | POA: Diagnosis not present

## 2016-04-09 DIAGNOSIS — E11319 Type 2 diabetes mellitus with unspecified diabetic retinopathy without macular edema: Secondary | ICD-10-CM | POA: Insufficient documentation

## 2016-04-09 DIAGNOSIS — Z794 Long term (current) use of insulin: Secondary | ICD-10-CM | POA: Insufficient documentation

## 2016-04-09 DIAGNOSIS — N189 Chronic kidney disease, unspecified: Secondary | ICD-10-CM | POA: Insufficient documentation

## 2016-04-09 DIAGNOSIS — M1990 Unspecified osteoarthritis, unspecified site: Secondary | ICD-10-CM | POA: Diagnosis not present

## 2016-04-09 DIAGNOSIS — Z87891 Personal history of nicotine dependence: Secondary | ICD-10-CM | POA: Diagnosis not present

## 2016-04-09 DIAGNOSIS — Z791 Long term (current) use of non-steroidal anti-inflammatories (NSAID): Secondary | ICD-10-CM | POA: Diagnosis not present

## 2016-04-09 LAB — BASIC METABOLIC PANEL
Anion gap: 5 (ref 5–15)
BUN: 14 mg/dL (ref 6–20)
CO2: 30 mmol/L (ref 22–32)
Calcium: 9.4 mg/dL (ref 8.9–10.3)
Chloride: 102 mmol/L (ref 101–111)
Creatinine, Ser: 0.71 mg/dL (ref 0.44–1.00)
GFR calc Af Amer: >60 mL/min (ref >60)
GFR calc non Af Amer: >60 mL/min (ref >60)
Glucose, Bld: 246 mg/dL — ABNORMAL HIGH (ref 65–99)
Potassium: 4.6 mmol/L (ref 3.5–5.1)
Sodium: 137 mmol/L (ref 135–145)

## 2016-04-09 LAB — URINALYSIS, ROUTINE W REFLEX MICROSCOPIC
Bilirubin Urine: NEGATIVE
Glucose, UA: 500 mg/dL — AB
Hgb urine dipstick: NEGATIVE
Ketones, ur: NEGATIVE mg/dL
Nitrite: NEGATIVE
Protein, ur: NEGATIVE mg/dL
Specific Gravity, Urine: <1.005 — ABNORMAL LOW (ref 1.005–1.030)
pH: 5.5 (ref 5.0–8.0)

## 2016-04-09 LAB — CBC
HCT: 43.8 % (ref 36.0–46.0)
Hemoglobin: 14.7 g/dL (ref 12.0–15.0)
MCH: 28.7 pg (ref 26.0–34.0)
MCHC: 33.6 g/dL (ref 30.0–36.0)
MCV: 85.4 fL (ref 78.0–100.0)
Platelets: 122 10*3/uL — ABNORMAL LOW (ref 150–400)
RBC: 5.13 MIL/uL — ABNORMAL HIGH (ref 3.87–5.11)
RDW: 13.4 % (ref 11.5–15.5)
WBC: 5.8 10*3/uL (ref 4.0–10.5)

## 2016-04-09 LAB — URINE MICROSCOPIC-ADD ON: RBC / HPF: NONE SEEN RBC/hpf (ref 0–5)

## 2016-04-09 LAB — CBG MONITORING, ED: Glucose-Capillary: 258 mg/dL — ABNORMAL HIGH (ref 65–99)

## 2016-04-09 MED ORDER — SODIUM CHLORIDE 0.9 % IV SOLN: 1000.0000 mL | INTRAVENOUS | Status: DC

## 2016-04-09 MED ORDER — IOPAMIDOL (ISOVUE-300) INJECTION 61%
100.0000 mL | Freq: Once | INTRAVENOUS | Status: AC | PRN
  Administered 2016-04-09: 100 mL via INTRAVENOUS

## 2016-04-09 MED ORDER — DIATRIZOATE MEGLUMINE & SODIUM 66-10 % PO SOLN
ORAL | Status: AC
  Filled 2016-04-09: qty 30

## 2016-04-09 MED ORDER — SODIUM CHLORIDE 0.9 % IV SOLN
1000.0000 mL | Freq: Once | INTRAVENOUS | Status: AC
  Administered 2016-04-09: 1000 mL via INTRAVENOUS

## 2016-04-09 NOTE — ED Notes (Signed)
Pt ripped up discharge instructions. Pt reported, "that doctor gave me conflicting information." "i dont need that paperwork." nad noted.

## 2016-04-09 NOTE — ED Notes (Signed)
EDP at bedside  

## 2016-04-09 NOTE — ED Provider Notes (Signed)
CSN: [id number]     Arrival date & time 04/09/16  1002 History  By signing my name below, I, Paula Steele, attest that this documentation has been prepared under the direction and in the presence of Jola Schmidt, MD.  Electronically Signed: Reola Steele, ED Scribe. 04/09/2016. 11:31 AM.   Chief Complaint  Patient presents with  . Fatigue   HPI HPI Comments: Paula Steele is a 70 y.o. 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 Pt has associated intermittent cough, SOB, night sweats, and decreased appetite. Her symptoms are aggravated with exertion. No alleviating factors noted. Pt reports that she had dx'd PNA three months ago and finished a course of antibiotics which resolved her sx at that time, and came into the ED because her sx are similar to her previous PNA episode. Pt states that she has chronic L-sided abdominal pain that is unchanged "for a few years", and is scheduled to have her yearly CT abdominal scan tomorrow. She has had soft stool bowel movements recently. She denies CP, melena, hematochezia, nausea or vomiting.  PCP- Dr. Octavio Graves, MD and Particia Nearing, PA-C.   Past Medical History  Diagnosis Date  . Diverticulosis of colon (without mention of hemorrhage)   . Personal history of other diseases of digestive system     OCCASIONAL ABD. PAIN  . Other chronic nonalcoholic liver disease     SEES DR Deatra Ina-- ?SCARRING OR CIRRHOSIS PER PET SCAN 2010  . Benign neoplasm of adrenal gland   . Benign neoplasm of colon   . Colon polyp   . Diabetes mellitus without mention of complication     INSULIN AND ORAL MEDS  . 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 Hyperlipidemia   . Vitamin D deficiency   .  Neck pain, chronic S/P RADIAL NECK DISSECTION--  2007    LEFT SIDE OF NECK AND SHOULDER PAIN  . Insomnia DUE TO STRESS  . Shortness of breath on exertion   . Esophageal reflux     CONTROLLED W/ PRILOSEC  . Oropharyngeal dysphagia MILD RIGHT SUPRAGLOTTIC REGION    PT STATES SHE EATS VERY SLOWLY AND CHEWS FOOD WELL  . Weakness of both legs   . Degeneration of intervertebral disc, site unspecified     LUMBAR  . Arthritis      SHOULDERS--   IS CURRENTLY HAS LEFT SHOULDER PAIN  . 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 Left arm pain     and  heaviness since radical neck surg  . Chronic meniscal tear of knee, right   . Urgency incontinence   . Blood transfusion   . Hx: UTI (urinary tract infection)   . Chronic kidney disease     bladder incontinence   . Bronchitis     10/2012- followed by ER and then PCP- Dr Melina Copa   . Shortness of breath   . Hypertension    Past Surgical History  Procedure Laterality Date  . Shoulder surgery  1990    LEFT  . Laparoscopic salpingoopherectomy  2001    RIGHT  W/ LYSIS AHESIONS  . Abdominal hysterectomy  1984    W/  LSO  . Neck mass excision  2007    LEFT  . Radical neck dissection  2007    W/ LEFT TONSILLECTOMY AND LEFT EXCISION LEFT TONGUE BASE DUE TO CANCER  . Direct laryngoscopy  2007    W/ LEFT NECK MASS EXCISION  . Tonsillectomy  01-31-2009    RIGHT (DUE TO MASS)  . Tonsillectomy  2007    LEFT W/ NECK DISSECTION DUE TO CANCER  . Cholecystectomy  15 YRS AGO  . Excision left sebacous cyst  05-14-2011 W/  LOCAL  . Cataract extraction w/ intraocular lens  implant, bilateral    . Knee arthroscopy  09/26/2011    Procedure: ARTHROSCOPY KNEE;  Surgeon: Gearlean Alf;  Location: [place];  Service: Orthopedics;  Laterality: Right;  right knee arthroscopy with meniscal tear  . Knee arthroscopy  03/25/2012     Procedure: ARTHROSCOPY KNEE;  Surgeon: Gearlean Alf, MD;  Location: [facility];  Service: Orthopedics;  Laterality: Right;  With Debridement of medial lateral meniscus  . Total knee arthroplasty Right 11/30/2012    Procedure: TOTAL KNEE ARTHROPLASTY;  Surgeon: Gearlean Alf, MD;  Location: [facility];  Service: Orthopedics;  Laterality: Right;   Family History  Problem Relation Age of Onset  . Thyroid cancer Brother   . Diabetes Mother   . Diabetes Brother   . Heart disease Father   . Heart disease Brother    Social History  Substance Use Topics  . Smoking status: Former Smoker -- 15 years    Types: Cigarettes    Quit date: 09/22/1984  . Smokeless tobacco: Never Used  . Alcohol Use: No   OB History    No data available     Review of Systems A complete 10 system review of systems was obtained and all systems are negative except as noted in the HPI and PMH.   Allergies  Actos; Bydureon; and Metformin and related  Home Medications   Prior to Admission medications   Medication Sig Start Date End Date Taking? Authorizing Provider  albuterol (PROVENTIL HFA;VENTOLIN HFA) 108 (90 BASE) MCG/ACT inhaler Inhale 2 puffs into the lungs every 6 (six) hours as needed. Wheezing   Yes Historical Provider, MD  diazepam (VALIUM) 10 MG tablet Take 0.5 tablets (5 mg total) by mouth every 12 (twelve) hours as needed for anxiety. Muscle spasms 12/08/12  Yes Nishant Dhungel, MD  gabapentin (NEURONTIN) 300 MG capsule Take 300 mg by mouth at bedtime.   Yes Historical Provider, MD  glimepiride (AMARYL) 4 MG tablet Take 4 mg by mouth 2 (two) times daily.    Yes Historical Provider, MD  HYDROcodone-acetaminophen (NORCO/VICODIN) 5-325 MG per tablet Take 1 tablet by mouth every 6 (six) hours as needed for pain. 12/08/12  Yes Nishant Dhungel, MD  ibuprofen (ADVIL,MOTRIN) 800 MG tablet Take 800 mg by mouth every 8 (eight) hours as needed.   Yes Historical Provider, MD  insulin glargine (LANTUS) 100 UNIT/ML  injection Inject 60 Units into the skin 2 (two) times daily. Sliding scale in the mornings   Yes Historical Provider, MD  loratadine (CLARITIN) 10 MG tablet Take 10 mg by mouth daily.   Yes Historical Provider, MD  NOVOLOG FLEXPEN 100 UNIT/ML injection Inject 12-38 Units into the skin 3 (three) times daily. PER Sliding scale 06/25/11  Yes Historical Provider, MD  oxybutynin (DITROPAN-XL) 10 MG 24 hr tablet Take 10 mg by mouth daily.  02/28/16  Yes Historical Provider,  MD  promethazine (PHENERGAN) 25 MG tablet Take 25 mg by mouth every 6 (six) hours as needed for nausea or vomiting.   Yes Historical Provider, MD  rosuvastatin (CRESTOR) 5 MG tablet Take 5 mg by mouth daily.   Yes Historical Provider, MD  sitaGLIPtin (JANUVIA) 100 MG tablet Take 100 mg by mouth every morning.   Yes Historical Provider, MD  tiZANidine (ZANAFLEX) 4 MG tablet Take 4 mg by mouth at bedtime. As needed 06/24/11  Yes Historical Provider, MD  triamcinolone (KENALOG) 0.1 % cream Apply 1 application topically 2 (two) times daily as needed. itching 05/25/11  Yes Historical Provider, MD  Vitamin D, Ergocalciferol, (DRISDOL) 50000 UNITS CAPS Take 50,000 Units by mouth every Sunday.    Yes Historical Provider, MD   BP 127/68 mmHg  Pulse 74  Temp(Src) 97.5 F (36.4 C) (Oral)  Resp 20  Ht 5\' 8"  (1.727 m)  Wt 260 lb (117.935 kg)  BMI 39.54 kg/m2  SpO2 94%   Physical Exam  Constitutional: She is oriented to person, place, and time. She appears well-developed and well-nourished.  HENT:  Head: Normocephalic.  Eyes: EOM are normal.  Neck: Normal range of motion.  Cardiovascular: Normal rate.   Pulmonary/Chest: Effort normal and breath sounds normal. No respiratory distress. She has no wheezes. She has no rales.  Abdominal: She exhibits no distension. There is tenderness. There is no guarding.  Pt has bilateral abdominal tenderness.  Musculoskeletal: Normal range of motion.  Neurological: She is alert and oriented to person, place,  and time.  Psychiatric: She has a normal mood and affect.  Nursing note and vitals reviewed.  ED Course  Procedures (including critical care time)  DIAGNOSTIC STUDIES: Oxygen Saturation is 94% on RA, adequate by my interpretation.   COORDINATION OF CARE: 11:25 AM-Discussed next steps with pt including EKG, UA, CBC, BMP, DG Chest, and CT A/p w/ contrast. Pt verbalized understanding and is agreeable with the plan.   Labs Review Labs Reviewed  BASIC METABOLIC PANEL - Abnormal; Notable for the following:    Glucose, Bld 246 (*)    All other components within normal limits  CBC - Abnormal; Notable for the following:    RBC 5.13 (*)    Platelets 122 (*)    All other components within normal limits  URINALYSIS, ROUTINE W REFLEX MICROSCOPIC (NOT AT [facility]) - Abnormal; Notable for the following:    Specific Gravity, Urine <1.005 (*)    Glucose, UA 500 (*)    Leukocytes, UA TRACE (*)    All other components within normal limits  URINE MICROSCOPIC-ADD ON - Abnormal; Notable for the following:    Squamous Epithelial / LPF 0-5 (*)    Bacteria, UA RARE (*)    All other components within normal limits  CBG MONITORING, ED - Abnormal; Notable for the following:    Glucose-Capillary 258 (*)    All other components within normal limits   Imaging Review Dg Chest 2 View  04/09/2016  CLINICAL DATA:  Corrective cough, congestion, shortness of breath, history diabetes mellitus, chronic nonalcoholic liver disease, hypertension, former smoker EXAM: CHEST  2 VIEW COMPARISON:  01/09/2016 FINDINGS: Minimal enlargement of cardiac silhouette. Mediastinal contours and pulmonary vascularity normal. Improved aeration at LEFT base with minimal residual atelectasis. No infiltrate, pleural effusion or pneumothorax. Bones appear demineralized. IMPRESSION: Minimal enlargement of cardiac silhouette. Improved aeration at LEFT base. Electronically Signed   By: Lavonia Dana M.D.   On: 04/09/2016 11:59   Ct Abdomen Pelvis  W Contrast  04/09/2016  CLINICAL DATA:  Generalized weakness for the last several days. Intermittent cough, shortness of breath, chest pain and diarrhea. EXAM: CT ABDOMEN AND PELVIS WITH CONTRAST TECHNIQUE: Multidetector CT imaging of the abdomen and pelvis was performed using the standard protocol following bolus administration of intravenous contrast. CONTRAST:  100 ml ISOVUE-300 IOPAMIDOL (ISOVUE-300) INJECTION 61% COMPARISON:  CT abdomen and pelvis 05/27/2012. FINDINGS: Heart size is mildly enlarged. No pleural or pericardial effusion. Calcific aortic and coronary atherosclerosis is identified. Linear scar in the left lung base is unchanged. The liver is low attenuating with a nodular border and hypertrophied caudate lobe consistent with cirrhosis. No focal liver lesion is seen. The umbilical vein is recanalized. The spleen measures 15 cm craniocaudal without focal lesion. The gallbladder has been removed. The pancreas, biliary tree and right kidney are unremarkable. Tiny left renal cyst is noted. Small left adrenal adenoma is unchanged. The right adrenal gland is unremarkable. The patient is status post hysterectomy. Sigmoid diverticulosis without diverticulitis is noted. The stomach, small bowel and appendix appear normal. Aortoiliac atherosclerosis without aneurysm is identified. There is no lymphadenopathy or fluid. No lytic or sclerotic bony lesion is seen. IMPRESSION: No acute abnormality abdomen or pelvis. Extensive aortic and coronary atherosclerosis. 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 Sigmoid diverticulosis without diverticulitis. No change in a small left adrenal adenoma. Electronically Signed   By: Inge Rise M.D.   On: 04/09/2016 12:42    I have personally reviewed and evaluated these images and lab results as part of my medical decision-making.   EKG Interpretation None      MDM   Final diagnoses:   Generalized weakness    Patient is overall well-appearing.  Vital signs are stable.  Labs and CT of the abdomen and pelvis without acute pathology or obvious abnormalities.  Chest x-ray with clearing pneumonia which is consistent with her clinical course.  Primary care follow-up.  No indication for additional testing to the emergency department.  I believe the patient is stable for discharge home and does not need admission or additional testing now.  She'll need to follow-up closely with her providers on an outpatient basis.  She understands to return to the ER for new or worsening symptoms   I personally performed the services described in this documentation, which was scribed in my presence. The recorded information has been reviewed and is accurate.       Jola Schmidt, MD 04/09/16 1314

## 2016-04-09 NOTE — Discharge Instructions (Signed)

## 2016-04-09 NOTE — ED Notes (Signed)
Attempted IV access x2. Charge RN aware and at bedside attempting IV access.

## 2016-04-09 NOTE — ED Notes (Signed)
Pt reports generalized weakness and malaise for last several days. Pt reports intermittent cough,sob,cp,diarrhea. Pt denies any cp at this time. Mild dyspnea noted with exertion. nad noted.

## 2016-04-10 ENCOUNTER — Other Ambulatory Visit: Payer: Medicare Other

## 2016-06-25 ENCOUNTER — Ambulatory Visit (AMBULATORY_SURGERY_CENTER): Payer: Self-pay

## 2016-06-25 VITALS — Ht 68.0 in | Wt 258.6 lb

## 2016-06-25 DIAGNOSIS — T17320A Food in larynx causing asphyxiation, initial encounter: Secondary | ICD-10-CM

## 2016-06-25 DIAGNOSIS — Z86010 Personal history of colonic polyps: Secondary | ICD-10-CM

## 2016-06-25 DIAGNOSIS — R1032 Left lower quadrant pain: Secondary | ICD-10-CM

## 2016-06-25 NOTE — Progress Notes (Signed)
Pt came into the office today for her PV prior to her colonoscopy with Dr Silverio Decamp on 07/08/16.Pt has a complicated medical history and is having difficulty with choking on food ,abd pain and left neck pain.Scheduled pt for an office visit with [facility], on 07/01/16 to discuss an endo/colon.She will call our office if she has any questions.

## 2016-07-01 ENCOUNTER — Encounter (INDEPENDENT_AMBULATORY_CARE_PROVIDER_SITE_OTHER): Payer: Self-pay

## 2016-07-01 ENCOUNTER — Encounter: Payer: Self-pay | Admitting: Gastroenterology

## 2016-07-01 ENCOUNTER — Encounter: Payer: Medicare Other | Admitting: Gastroenterology

## 2016-07-01 ENCOUNTER — Ambulatory Visit: Payer: Medicare Other | Admitting: Gastroenterology

## 2016-07-01 NOTE — Patient Instructions (Signed)
If you are age 71 or older, your body mass index should be between 23-30. Your Body mass index is 39.38 kg/m. If this is out of the aforementioned range listed, please consider follow up with your Primary Care Provider.  If you are age 55 or younger, your body mass index should be between 19-25. Your Body mass index is 39.38 kg/m. If this is out of the aformentioned range listed, please consider follow up with your Primary Care Provider.   Thank you for choosing [facility]

## 2016-07-01 NOTE — Progress Notes (Signed)
This encounter was created in error - please disregard.

## 2016-07-08 ENCOUNTER — Encounter: Payer: Medicare Other | Admitting: Gastroenterology

## 2016-07-11 ENCOUNTER — Other Ambulatory Visit (HOSPITAL_COMMUNITY): Payer: Self-pay | Admitting: Otolaryngology

## 2016-07-11 DIAGNOSIS — R490 Dysphonia: Secondary | ICD-10-CM

## 2016-07-11 DIAGNOSIS — R1319 Other dysphagia: Secondary | ICD-10-CM

## 2016-07-11 DIAGNOSIS — R1310 Dysphagia, unspecified: Secondary | ICD-10-CM

## 2016-07-13 ENCOUNTER — Other Ambulatory Visit: Payer: Self-pay | Admitting: Physician Assistant

## 2016-08-07 LAB — HEMOGLOBIN A1C: Hemoglobin A1C: 10.1

## 2016-08-07 LAB — LIPID PANEL
Cholesterol: 120 mg/dL (ref 0–200)
HDL: 50 mg/dL (ref 35–70)
LDL Cholesterol: 44 mg/dL
Triglycerides: 132 mg/dL (ref 40–160)

## 2016-09-05 ENCOUNTER — Ambulatory Visit: Payer: Medicare Other | Attending: Orthopedic Surgery | Admitting: Physical Therapy

## 2016-09-05 DIAGNOSIS — G8929 Other chronic pain: Secondary | ICD-10-CM | POA: Diagnosis present

## 2016-09-05 DIAGNOSIS — M25561 Pain in right knee: Secondary | ICD-10-CM | POA: Insufficient documentation

## 2016-09-05 DIAGNOSIS — M6281 Muscle weakness (generalized): Secondary | ICD-10-CM | POA: Insufficient documentation

## 2016-09-05 NOTE — Therapy (Addendum)
[facility] [place], [place], [address] Phone: [phone]   Fax:  [phone]  Physical Therapy Evaluation  Patient Details  Name: Paula Steele MRN: [id number] Date of Birth: Sep 10, 1946 Referring Provider: Annie Main Pill MD.  Encounter Date: 09/05/2016      PT End of Session - 09/05/16 1800    Visit Number 1   Number of Visits 16   Date for PT Re-Evaluation 11/04/16   PT Start Time 0420   PT Stop Time 0510   PT Time Calculation (min) 50 min   Activity Tolerance Patient tolerated treatment well   Behavior During Therapy [facility] for tasks assessed/performed      Past Medical History:  Diagnosis Date  . Arthritis     SHOULDERS--   IS CURRENTLY HAS LEFT SHOULDER PAIN  . Benign neoplasm of adrenal gland   . Benign neoplasm of colon   . Blood transfusion   . Bronchitis    10/2012- followed by ER and then PCP- Dr Melina Copa   . Chronic kidney disease    bladder incontinence   . Chronic meniscal tear of knee, right   . Colon polyp   . Degeneration of intervertebral disc, site unspecified    LUMBAR  . Diabetes mellitus without mention of complication    INSULIN AND ORAL MEDS  . Diverticulosis of colon (without mention of hemorrhage)   . Esophageal reflux    CONTROLLED W/ PRILOSEC  . History of neck pain    chronic left sided neck pain  . Hx: UTI (urinary tract infection)   . Hyperlipidemia   . Hypertension   . Insomnia DUE TO STRESS  . Left arm pain    and  heaviness since radical neck surg  . Neck pain, chronic S/P RADIAL NECK DISSECTION--  2007   LEFT SIDE OF NECK AND SHOULDER PAIN  . Oropharyngeal dysphagia MILD RIGHT SUPRAGLOTTIC REGION   PT STATES SHE EATS VERY SLOWLY AND CHEWS FOOD WELL  . Other chronic nonalcoholic liver disease    SEES DR Deatra Ina-- ?SCARRING OR CIRRHOSIS PER PET SCAN 2010  . Personal history of other diseases of digestive system    OCCASIONAL ABD. PAIN  . 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 Shortness of breath   . Shortness of breath on exertion   . 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 Urgency incontinence   . Vitamin D deficiency   . Weakness of both legs     Past Surgical History:  Procedure Laterality Date  . ABDOMINAL HYSTERECTOMY  1984   W/  LSO  . CATARACT EXTRACTION W/ INTRAOCULAR LENS  IMPLANT, BILATERAL    . CHOLECYSTECTOMY  15 YRS AGO  . DIRECT LARYNGOSCOPY  2007   W/ LEFT NECK MASS EXCISION  . EXCISION LEFT SEBACOUS CYST  05-14-2011 W/  LOCAL  . KNEE ARTHROSCOPY  09/26/2011   Procedure: ARTHROSCOPY KNEE;  Surgeon: Gearlean Alf;  Location: [facility];  Service: Orthopedics;  Laterality: Right;  right knee arthroscopy with meniscal tear  . KNEE ARTHROSCOPY  03/25/2012   Procedure: ARTHROSCOPY KNEE;  Surgeon: Gearlean Alf, MD;  Location: [facility];  Service: Orthopedics;  Laterality: Right;  With Debridement of medial lateral meniscus  . LAPAROSCOPIC SALPINGOOPHERECTOMY  2001   RIGHT  W/ LYSIS AHESIONS  . NECK MASS  EXCISION  2007   LEFT  . RADICAL NECK DISSECTION  2007   W/ LEFT TONSILLECTOMY AND LEFT EXCISION LEFT TONGUE BASE DUE TO CANCER  . SHOULDER SURGERY  1990   LEFT  . TONSILLECTOMY  01-31-2009   RIGHT (DUE TO MASS)  . TONSILLECTOMY  2007   LEFT W/ NECK DISSECTION DUE TO CANCER  . TOTAL KNEE ARTHROPLASTY Right 11/30/2012   Procedure: TOTAL KNEE ARTHROPLASTY;  Surgeon: Gearlean Alf, MD;  Location: [facility];  Service: Orthopedics;  Laterality: Right;    There were no vitals filed for this visit.       Subjective Assessment - 09/05/16 1804    Subjective The patient presents to outpatient physical therapy with chronic knee pain.  She rates her pain at a 7/10 today and states she has had occasions of 10/10 pain-levels and has been unable to walk during these times.  Recommended that patient use  an assistive device (at least a cane) but she elects not to.  The patient's pain increases with prolonged standing and sitting too long.  She states that she is likely going to have another knee replacement as X-rays reportedly show evidence of cement chipping away.   Limitations Sitting   How long can you sit comfortably? 10-15 minutes.   How long can you stand comfortably? 10-15 minutes.   Patient Stated Goals Get stronger.   Pain Score 7    Pain Location Knee   Pain Orientation Right   Pain Descriptors / Indicators Aching   Pain Type Chronic pain   Pain Onset More than a month ago   Pain Frequency Constant   Aggravating Factors  Please see above.            [facility] PT Assessment - 09/05/16 0001      Assessment   Medical Diagnosis RT TKA PAIN.   Referring Provider Annie Main Pill MD.   Onset Date/Surgical Date --  Years.     Precautions   Precautions --  No ultrasound.     Balance Screen   Has the patient fallen in the past 6 months Yes  Patient needs to be using a assistive device at all times.   Has the patient had a decrease in activity level because of a fear of falling?  Yes   Is the patient reluctant to leave their home because of a fear of falling?  Yes     Prior Function   Level of Independence Independent     Observation/Other Assessments-Edema    Edema --  No notable right knee edema.     Posture/Postural Control   Posture/Postural Control Postural limitations   Postural Limitations Rounded Shoulders;Forward head;Decreased lumbar lordosis;Weight shift left     ROM / Strength   AROM / PROM / Strength AROM;Strength     AROM   Overall AROM Comments Full active right knee extension with flexion to 115 degree.s     Strength   Overall Strength Comments Right hip strength= 3+ to 4-/5 and right knee strength= 4/5.     Palpation   Palpation comment Patient c/o diffuse right anterior knee pain but especially in the area of the medial joint line.      Ambulation/Gait   Gait Pattern Decreased step length - right;Decreased step length - left;Decreased stance time - right;Decreased stride length;Decreased hip/knee flexion - right;Decreased hip/knee flexion - left;Decreased dorsiflexion - right;Decreased dorsiflexion - left;Shuffle;Antalgic;Poor foot clearance - left;Poor foot clearance - right  Excessive bilateral toe out.  Gait Comments Patient should be using an assistive device at all times.                   [facility] Adult PT Treatment/Exercise - 09/05/16 0001      Moist Heat Therapy   Number Minutes Moist Heat 20 Minutes   Moist Heat Location --  Right knee.     Acupuncturist Location Right knee.   Electrical Stimulation Action IFC   Electrical Stimulation Parameters 80-150 HZ x 20 minutes.   Electrical Stimulation Goals Pain                  PT Short Term Goals - 09/05/16 1816      PT SHORT TERM GOAL #1   Title STG's=LTG's.           PT Long Term Goals - 09/05/16 1817      PT LONG TERM GOAL #1   Title Sit 30 minutes with pain not > 4/10.   Time 8   Period Weeks   Status New     PT LONG TERM GOAL #2   Title Stand 30 minutes with pain not > 4/10.   Time 8   Period Weeks   Status New     PT LONG TERM GOAL #3   Title Increase right LE strength to 4+/5 to increase stability for functional tasks.   Time 8   Period Weeks   Status New     PT LONG TERM GOAL #4   Title Perform ADL's with pain not > 4/10.   Time 8   Period Weeks   Status New               Plan - 09/05/16 1812    Clinical Impression Statement The patient has marked righ knee pain especially medially.  She is also weak through her right LE.  Her right knee is not notably swollen or warm to touch.  Her gait is impaired and it is recommended she use an assistive device.   Rehab Potential Fair   PT Frequency 2x / week   PT Duration 8 weeks   PT Treatment/Interventions ADLs/Self Care Home  Management;Cryotherapy;Electrical Stimulation;Moist Heat;Therapeutic activities;Therapeutic exercise;Neuromuscular re-education;Patient/family education;Manual techniques;Passive range of motion;Vasopneumatic Device   PT Next Visit Plan Nustep; general LE strengthening; gait training; HMP and electrical stimulation to right knee.  No ultrasound.   Consulted and Agree with Plan of Care Patient      Patient will benefit from skilled therapeutic intervention in order to improve the following deficits and impairments:  Abnormal gait, Decreased activity tolerance, Decreased strength, Pain  Visit Diagnosis: Chronic pain of right knee - Plan: PT plan of care cert/re-cert  Muscle weakness (generalized) - Plan: PT plan of care cert/re-cert      G-Codes - 96/04/54 1836    Functional Assessment Tool Used Clinical judgement.   Functional Limitation Mobility: Walking and moving around   Mobility: Walking and Moving Around Current Status [id number]) At least 40 percent but less than 60 percent impaired, limited or restricted   Mobility: Walking and Moving Around Goal Status [phone]) At least 20 percent but less than 40 percent impaired, limited or restricted       Problem List Patient Active Problem List   Diagnosis Date Noted  . Shortness of breath 12/30/2013  . Hyperlipidemia 12/30/2013  . Acute encephalopathy 12/07/2012  . CVA (cerebral infarction) 12/07/2012  . OA (osteoarthritis) of knee 11/30/2012  . Acute medial  meniscal tear 03/25/2012  . Personal history of colonic polyps 08/15/2011  . Abdominal pain, left lower quadrant 08/15/2011  . Cirrhosis of liver without mention of alcohol 08/15/2011  . Obesity 08/15/2011  . 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 DM 04/05/2008  . DEPRESSION 04/05/2008  . CAD 04/05/2008  . GERD 04/05/2008  . GASTROPARESIS 04/05/2008  . OTHER CHRONIC NONALCOHOLIC LIVER DISEASE 94/50/3888  . [place] DISEASE 04/05/2008  . BENIGN NEOPLASM OF  ADRENAL GLAND 03/31/2007  . DIVERTICULOSIS OF COLON 09/06/2004  . COLONIC POLYPS 02/07/2003    Glenard Keesling, [place] 09/05/2016, 6:39 PM  [facility] [address] [place], [place], [address] Phone: [phone]   Fax:  [phone]  Name: JOSSLYNN MENTZER MRN: [id number] Date of Birth: [birth year] PHYSICAL THERAPY DISCHARGE SUMMARY  Visits from Start of Care: 1.  Current functional level related to goals / functional outcomes: See above.   Remaining deficits: Pt. Did not return.   Education / Equipment:  Plan: Patient agrees to discharge.  Patient goals were not met. Patient is being discharged due to meeting the stated rehab goals.  ?????         [place] Rachel Samples MPT

## 2016-09-24 ENCOUNTER — Ambulatory Visit: Payer: Medicare Other | Admitting: Gastroenterology

## 2016-09-25 ENCOUNTER — Ambulatory Visit: Payer: Medicare Other | Admitting: "Endocrinology

## 2016-10-05 ENCOUNTER — Other Ambulatory Visit: Payer: Self-pay | Admitting: Physician Assistant

## 2016-10-12 ENCOUNTER — Other Ambulatory Visit: Payer: Self-pay | Admitting: Physician Assistant

## 2016-10-14 ENCOUNTER — Other Ambulatory Visit: Payer: Self-pay | Admitting: Physician Assistant

## 2016-11-07 ENCOUNTER — Other Ambulatory Visit: Payer: Self-pay | Admitting: Physician Assistant

## 2016-11-13 ENCOUNTER — Ambulatory Visit: Payer: Medicare Other | Admitting: Gastroenterology

## 2017-02-21 ENCOUNTER — Encounter: Payer: Self-pay | Admitting: "Endocrinology

## 2017-02-21 ENCOUNTER — Ambulatory Visit (INDEPENDENT_AMBULATORY_CARE_PROVIDER_SITE_OTHER): Payer: Medicare Other | Admitting: "Endocrinology

## 2017-02-21 VITALS — BP 142/80 | HR 73 | Ht 69.0 in | Wt 246.0 lb

## 2017-02-21 DIAGNOSIS — Z794 Long term (current) use of insulin: Secondary | ICD-10-CM

## 2017-02-21 DIAGNOSIS — Z6836 Body mass index (BMI) 36.0-36.9, adult: Secondary | ICD-10-CM

## 2017-02-21 DIAGNOSIS — E1165 Type 2 diabetes mellitus with hyperglycemia: Secondary | ICD-10-CM | POA: Diagnosis not present

## 2017-02-21 DIAGNOSIS — E782 Mixed hyperlipidemia: Secondary | ICD-10-CM

## 2017-02-21 DIAGNOSIS — Z86010 Personal history of colonic polyps: Secondary | ICD-10-CM

## 2017-02-21 DIAGNOSIS — IMO0002 Reserved for concepts with insufficient information to code with codable children: Secondary | ICD-10-CM

## 2017-02-21 DIAGNOSIS — E1159 Type 2 diabetes mellitus with other circulatory complications: Secondary | ICD-10-CM | POA: Diagnosis not present

## 2017-02-21 MED ORDER — GLUCOSE BLOOD VI STRP: ORAL_STRIP | 2 refills | Status: AC

## 2017-02-21 NOTE — Patient Instructions (Signed)

## 2017-02-21 NOTE — Progress Notes (Signed)
Subjective:    Patient ID: Paula Steele, female    DOB: 1946/07/25. Patient is being seen in consultation for management of diabetes requested by  Octavio Graves, DO  Past Medical History:  Diagnosis Date  . Arthritis     SHOULDERS--   IS CURRENTLY HAS LEFT SHOULDER PAIN  . Benign neoplasm of adrenal gland   . Benign neoplasm of colon   . Blood transfusion   . Bronchitis    10/2012- followed by ER and then PCP- Dr Melina Copa   . Chronic kidney disease    bladder incontinence   . Chronic meniscal tear of knee, right   . Colon polyp   . Degeneration of intervertebral disc, site unspecified    LUMBAR  . Diabetes mellitus without mention of complication    INSULIN AND ORAL MEDS  . Diverticulosis of colon (without mention of hemorrhage)   . Esophageal reflux    CONTROLLED W/ PRILOSEC  . History of neck pain    chronic left sided neck pain  . Hx: UTI (urinary tract infection)   . Hyperlipidemia   . Hypertension   . Insomnia DUE TO STRESS  . Left arm pain    and  heaviness since radical neck surg  . Neck pain, chronic S/P RADIAL NECK DISSECTION--  2007   LEFT SIDE OF NECK AND SHOULDER PAIN  . Oropharyngeal dysphagia MILD RIGHT SUPRAGLOTTIC REGION   PT STATES SHE EATS VERY SLOWLY AND CHEWS FOOD WELL  . Other chronic nonalcoholic liver disease    SEES DR Deatra Ina-- ?SCARRING OR CIRRHOSIS PER PET SCAN 2010  . Personal history of other diseases of digestive system    OCCASIONAL ABD. PAIN  . 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 Shortness of breath   . Shortness of breath on exertion   . 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 Urgency incontinence   . Vitamin D deficiency   . Weakness of both legs    Past Surgical History:  Procedure Laterality Date  . ABDOMINAL HYSTERECTOMY  1984   W/  LSO  . CATARACT EXTRACTION W/ INTRAOCULAR LENS  IMPLANT,  BILATERAL    . CHOLECYSTECTOMY  15 YRS AGO  . DIRECT LARYNGOSCOPY  2007   W/ LEFT NECK MASS EXCISION  . EXCISION LEFT SEBACOUS CYST  05-14-2011 W/  LOCAL  . KNEE ARTHROSCOPY  09/26/2011   Procedure: ARTHROSCOPY KNEE;  Surgeon: Gearlean Alf;  Location: [facility];  Service: Orthopedics;  Laterality: Right;  right knee arthroscopy with meniscal tear  . KNEE ARTHROSCOPY  03/25/2012   Procedure: ARTHROSCOPY KNEE;  Surgeon: Gearlean Alf, MD;  Location: [facility];  Service: Orthopedics;  Laterality: Right;  With Debridement of medial lateral meniscus  . LAPAROSCOPIC SALPINGOOPHERECTOMY  2001   RIGHT  W/ LYSIS AHESIONS  . NECK MASS EXCISION  2007   LEFT  . RADICAL NECK DISSECTION  2007   W/ LEFT TONSILLECTOMY AND LEFT EXCISION LEFT TONGUE BASE DUE TO CANCER  . SHOULDER SURGERY  1990   LEFT  . TONSILLECTOMY  01-31-2009   RIGHT (DUE TO MASS)  . TONSILLECTOMY  2007   LEFT W/ NECK DISSECTION DUE TO CANCER  . TOTAL KNEE ARTHROPLASTY Right 11/30/2012   Procedure: TOTAL KNEE ARTHROPLASTY;  Surgeon: Gearlean Alf, MD;  Location: [facility];  Service:  Orthopedics;  Laterality: Right;   Social History   Social History  . Marital status: Married    Spouse name: N/A  . Number of children: 4  . Years of education: N/A   Occupational History  . Disabled    Social History Main Topics  . Smoking status: Former Smoker    Years: 15.00    Types: Cigarettes    Quit date: 09/22/1984  . Smokeless tobacco: Never Used  . Alcohol use No  . Drug use: No  . Sexual activity: Not Currently   Other Topics Concern  . Not on file   Social History Narrative  . No narrative on file   Outpatient Encounter Prescriptions as of 02/21/2017  Medication Sig  . diazepam (VALIUM) 10 MG tablet Take 0.5 tablets (5 mg total) by mouth every 12 (twelve) hours as needed for anxiety. Muscle spasms  . fluticasone-salmeterol (ADVAIR HFA) 115-21 MCG/ACT inhaler Inhale 2 puffs into the lungs as needed.  Marland Kitchen  glucose blood (ACCU-CHEK GUIDE) test strip Use as instructed  . HYDROcodone-acetaminophen (NORCO/VICODIN) 5-325 MG per tablet Take 1 tablet by mouth every 6 (six) hours as needed for pain.  Marland Kitchen ibuprofen (ADVIL,MOTRIN) 800 MG tablet Take 800 mg by mouth every 8 (eight) hours as needed.  . insulin glargine (LANTUS) 100 UNIT/ML injection Inject 60 Units into the skin at bedtime.  Marland Kitchen NOVOLOG FLEXPEN 100 UNIT/ML injection Inject 1 Units into the skin 3 (three) times daily. PER Sliding scale  . promethazine (PHENERGAN) 25 MG tablet Take 25 mg by mouth every 6 (six) hours as needed for nausea or vomiting.  . rosuvastatin (CRESTOR) 5 MG tablet Take 5 mg by mouth daily.  . sitaGLIPtin (JANUVIA) 100 MG tablet Take 100 mg by mouth every morning.  Marland Kitchen tiZANidine (ZANAFLEX) 4 MG tablet Take 4 mg by mouth at bedtime. As needed  . Vitamin D, Ergocalciferol, (DRISDOL) 50000 UNITS CAPS Take 50,000 Units by mouth every Sunday.   . [DISCONTINUED] albuterol (PROVENTIL HFA;VENTOLIN HFA) 108 (90 BASE) MCG/ACT inhaler Inhale 2 puffs into the lungs every 6 (six) hours as needed. Wheezing  . [DISCONTINUED] gabapentin (NEURONTIN) 300 MG capsule Take 300 mg by mouth at bedtime.  . [DISCONTINUED] glimepiride (AMARYL) 4 MG tablet Take 4 mg by mouth 2 (two) times daily.   . [DISCONTINUED] loratadine (CLARITIN) 10 MG tablet Take 10 mg by mouth every other day.   . [DISCONTINUED] oxybutynin (DITROPAN-XL) 10 MG 24 hr tablet Take 10 mg by mouth daily.   . [DISCONTINUED] triamcinolone (KENALOG) 0.1 % cream Apply 1 application topically 2 (two) times daily as needed. itching   No facility-administered encounter medications on file as of 02/21/2017.    ALLERGIES: Allergies  Allergen Reactions  . Actos [Pioglitazone]     edema  . Bydureon [Exenatide]     nausea  . Metformin And Related     diahrrhea   VACCINATION STATUS:  There is no immunization history on file for this patient.  Diabetes  She presents for her initial  diabetic visit. Onset time: she was diagnosed at age 50 yrs. Pertinent negatives for hypoglycemia include no confusion, headaches, pallor or seizures. Associated symptoms include fatigue, polydipsia and polyuria. Pertinent negatives for diabetes include no chest pain and no polyphagia. Diabetic complications include a CVA. Risk factors for coronary artery disease include dyslipidemia, diabetes mellitus, obesity, tobacco exposure, sedentary lifestyle and family history. Current diabetic treatment includes insulin injections and oral agent (dual therapy). Her weight is decreasing steadily. She is following a  generally unhealthy diet. When asked about meal planning, she reported none. She has not had a previous visit with a dietitian. She rarely participates in exercise. (She admits she does not have a functional meter and has not been monitoring glucose regularly.) An ACE inhibitor/angiotensin II receptor blocker is not being taken.  Hyperlipidemia  This is a chronic problem. The current episode started more than 1 year ago. The problem is controlled. Exacerbating diseases include diabetes and obesity. Pertinent negatives include no chest pain, myalgias or shortness of breath. Current antihyperlipidemic treatment includes statins. Risk factors for coronary artery disease include diabetes mellitus, a sedentary lifestyle, dyslipidemia, obesity and family history.     Review of Systems  Constitutional: Positive for fatigue. Negative for chills, fever and unexpected weight change.  HENT: Negative for trouble swallowing and voice change.   Eyes: Negative for visual disturbance.  Respiratory: Negative for cough, shortness of breath and wheezing.   Cardiovascular: Negative for chest pain, palpitations and leg swelling.  Gastrointestinal: Negative for diarrhea, nausea and vomiting.  Endocrine: Positive for polydipsia and polyuria. Negative for cold intolerance, heat intolerance and polyphagia.  Musculoskeletal:  Negative for arthralgias and myalgias.  Skin: Negative for color change, pallor, rash and wound.  Neurological: Negative for seizures and headaches.  Psychiatric/Behavioral: Negative for confusion and suicidal ideas.    Objective:    BP (!) 142/80   Pulse 73   Ht 5\' 9"  (1.753 m)   Wt 246 lb (111.6 kg)   BMI 36.33 kg/m   Wt Readings from Last 3 Encounters:  02/21/17 246 lb (111.6 kg)  07/01/16 259 lb (117.5 kg)  06/25/16 258 lb 9.6 oz (117.3 kg)    Physical Exam  Constitutional: She is oriented to person, place, and time. She appears well-developed.  HENT:  Head: Normocephalic and atraumatic.  Eyes: EOM are normal.  Neck: Normal range of motion. Neck supple. No tracheal deviation present. No thyromegaly present.  Cardiovascular: Normal rate and regular rhythm.   Pulmonary/Chest: Effort normal and breath sounds normal.  Abdominal: Soft. Bowel sounds are normal. She exhibits distension. There is no guarding.  Musculoskeletal: Normal range of motion. She exhibits no edema.  Neurological: She is alert and oriented to person, place, and time. She has normal reflexes. No cranial nerve deficit. Coordination normal.  Skin: Skin is warm and dry. No rash noted. No erythema. No pallor.  Psychiatric:  Reluctant affect.      CMP     Component Value Date/Time   NA 137 04/09/2016 1026   K 4.6 04/09/2016 1026   CL 102 04/09/2016 1026   CO2 30 04/09/2016 1026   GLUCOSE 246 (H) 04/09/2016 1026   BUN 14 04/09/2016 1026   CREATININE 0.71 04/09/2016 1026   CALCIUM 9.4 04/09/2016 1026   PROT 7.0 12/07/2012 0516   ALBUMIN 2.7 (L) 12/07/2012 0516   AST 21 12/07/2012 0516   ALT 18 12/07/2012 0516   ALKPHOS 68 12/07/2012 0516   BILITOT 1.1 12/07/2012 0516   GFRNONAA >60 04/09/2016 1026   GFRAA >60 04/09/2016 1026     Diabetic Labs (most recent): Lab Results  Component Value Date   HGBA1C 10.1 08/07/2016   HGBA1C 9.2 (H) 12/07/2012     Lipid Panel ( most recent) Lipid Panel      Component Value Date/Time   CHOL 120 08/07/2016   TRIG 132 08/07/2016   HDL 50 08/07/2016   CHOLHDL 4.5 12/08/2012 0449   VLDL 20 12/08/2012 0449   LDLCALC 44 08/07/2016  Assessment & Plan:   1. Uncontrolled type 2 diabetes mellitus with other circulatory complication, with long-term current use of insulin ([place])  - Patient has currently uncontrolled symptomatic type 2 DM since  71 years of age. - She does not have recent labs however her A1c was 10.1% from October 2017. - She will be sent to lab today.   Her diabetes is complicated by CVA, obesity/sedentary life and patient remains at a high risk for more acute and chronic complications of diabetes which include CAD, CVA, CKD, retinopathy, and neuropathy. These are all discussed in detail with the patient.  - I have counseled the patient on diet management and weight loss, by adopting a carbohydrate restricted/protein rich diet.  - Suggestion is made for patient to avoid simple carbohydrates   from their diet including Cakes , Desserts, Ice Cream,  Soda (  diet and regular) , Sweet Tea , Candies,  Chips, Cookies, Artificial Sweeteners,   and "Sugar-free" Products . This will help patient to have stable blood glucose profile and potentially avoid unintended weight gain.  - I encouraged the patient to switch to  unprocessed or minimally processed complex starch and increased protein intake (animal or plant source), fruits, and vegetables.  - Patient is advised to stick to a routine mealtimes to eat 3 meals  a day and avoid unnecessary snacks ( to snack only to correct hypoglycemia).  - The patient will be scheduled with Jearld Fenton, RDN, CDE for individualized DM education.  - I have approached patient with the following individualized plan to manage diabetes and patient agrees:   - Her fasting blood glucose this morning was 305, she may require basal/bolus insulin to treat diabetes to target. However, it is essential to assure her  commitment for proper monitoring of blood glucose for safe use of insulin. - I  urged her to start strict monitoring of blood glucose 4 times a day-before meals and at bedtime and return in one week with her meter and logs for treatment adjustment. I gave her a meter from clinic and RX for strips. -In the meantime, I'll increase her Lantus to 60 units daily at bedtime, and advised to hold NovoLog for now, continue Januvia 100 mg by mouth today.   -Patient is encouraged to call clinic for blood glucose levels less than 70 or above 300 mg /dl. - She reports intolerance to metformin. - I will discontinue glimepiride, risk outweighs benefit for this patient.  - Patient will be considered for SGLT2 inhibitors and/or incretin therapy as appropriate next visit. - Patient specific target  A1c;  LDL, HDL, Triglycerides, and  Waist Circumference were discussed in detail.  2) BP/HTN: Uncontrolled.  She is not on antihypertensive therapy. She will be considered for Losartan next visit.  3) Lipids/HPL:   Controlled, LDL 44.   Patient is advised tocontinue statins. 4)  Weight/Diet: CDE Consult will be initiated , exercise, and detailed carbohydrates information provided.  5) Chronic Care/Health Maintenance:  -Patient is on Statin medications and encouraged to continue to follow up with Ophthalmology, Podiatrist at least yearly or according to recommendations, and advised to  stay away from smoking. I have recommended yearly flu vaccine and pneumonia vaccination at least every 5 years; moderate intensity exercise for up to 150 minutes weekly; and  sleep for at least 7 hours a day.  - 60 minutes of time was spent on the care of this patient , 50% of which was applied for counseling on diabetes complications and  their preventions.  - Patient to bring meter and  blood glucose logs during her next visit.   - I advised patient to maintain close follow up with CYNTHIA BUTLER, DO for primary care  needs.  Follow up plan: - Return in about 1 week (around 02/28/2017) for meter, and logs, labs today.  Glade Lloyd, MD Phone: [phone]  Fax: [phone]   02/21/2017, 11:55 AM

## 2017-02-24 ENCOUNTER — Ambulatory Visit: Payer: Medicare Other | Admitting: "Endocrinology

## 2017-03-07 ENCOUNTER — Ambulatory Visit: Payer: Medicare Other | Admitting: "Endocrinology

## 2019-05-10 ENCOUNTER — Other Ambulatory Visit: Payer: Self-pay | Admitting: Otolaryngology

## 2019-05-10 DIAGNOSIS — M542 Cervicalgia: Secondary | ICD-10-CM

## 2019-05-10 DIAGNOSIS — M25512 Pain in left shoulder: Secondary | ICD-10-CM

## 2019-05-10 DIAGNOSIS — G8929 Other chronic pain: Secondary | ICD-10-CM

## 2019-05-30 ENCOUNTER — Other Ambulatory Visit: Payer: Self-pay

## 2019-05-30 ENCOUNTER — Ambulatory Visit
Admission: RE | Admit: 2019-05-30 | Discharge: 2019-05-30 | Disposition: A | Discharge status: Home / Self Care | Payer: Medicare Other | Source: Ambulatory Visit | Attending: Otolaryngology | Admitting: Otolaryngology

## 2019-05-30 DIAGNOSIS — M542 Cervicalgia: Secondary | ICD-10-CM

## 2019-05-30 DIAGNOSIS — G8929 Other chronic pain: Secondary | ICD-10-CM

## 2019-06-11 ENCOUNTER — Encounter: Payer: Self-pay | Admitting: Cardiovascular Disease

## 2019-06-16 ENCOUNTER — Ambulatory Visit (HOSPITAL_COMMUNITY): Payer: Medicare Other | Admitting: Psychiatry

## 2019-06-17 ENCOUNTER — Ambulatory Visit (INDEPENDENT_AMBULATORY_CARE_PROVIDER_SITE_OTHER): Payer: Medicare Other | Admitting: Cardiovascular Disease

## 2019-06-17 ENCOUNTER — Other Ambulatory Visit: Payer: Self-pay

## 2019-06-17 ENCOUNTER — Encounter: Payer: Self-pay | Admitting: Cardiovascular Disease

## 2019-06-17 ENCOUNTER — Encounter: Payer: Self-pay | Admitting: *Deleted

## 2019-06-17 VITALS — BP 122/82 | HR 90 | Ht 68.0 in | Wt 239.2 lb

## 2019-06-17 DIAGNOSIS — IMO0001 Reserved for inherently not codable concepts without codable children: Secondary | ICD-10-CM

## 2019-06-17 DIAGNOSIS — R0602 Shortness of breath: Secondary | ICD-10-CM | POA: Diagnosis not present

## 2019-06-17 DIAGNOSIS — I10 Essential (primary) hypertension: Secondary | ICD-10-CM | POA: Insufficient documentation

## 2019-06-17 DIAGNOSIS — R072 Precordial pain: Secondary | ICD-10-CM

## 2019-06-17 DIAGNOSIS — R079 Chest pain, unspecified: Secondary | ICD-10-CM

## 2019-06-17 DIAGNOSIS — Z794 Long term (current) use of insulin: Secondary | ICD-10-CM

## 2019-06-17 DIAGNOSIS — Z01812 Encounter for preprocedural laboratory examination: Secondary | ICD-10-CM

## 2019-06-17 DIAGNOSIS — E119 Type 2 diabetes mellitus without complications: Secondary | ICD-10-CM

## 2019-06-17 MED ORDER — METOPROLOL TARTRATE 100 MG PO TABS: 100.0000 mg | ORAL_TABLET | Freq: Once | ORAL | 0 refills | Status: DC

## 2019-06-17 NOTE — Patient Instructions (Addendum)
Medication Instructions:  Continue all current medications.  Labwork: BMET - do just prior to CT   Testing/Procedures:  Your physician has requested that you have an echocardiogram. Echocardiography is a painless test that uses sound waves to create images of your heart. It provides your doctor with information about the size and shape of your heart and how well your heart's chambers and valves are working. This procedure takes approximately one hour. There are no restrictions for this procedure.  Coronary CT - done at [facility].  Office will contact with results via phone or letter.    Follow-Up: 2 months   Any Other Special Instructions Will Be Listed Below (If Applicable).  If you need a refill on your cardiac medications before your next appointment, please call your pharmacy.  -----------------------------------------------------------------------------------  Your cardiac CT will be scheduled at one of the below locations:   [facility] [address] [place], [place] [address] [phone]  [facility] [address] [place], [place] [address] [phone]  Please arrive at the [facility] main entrance of [facility] 30-45 minutes prior to test start time. Proceed to the [facility] Radiology Department (first floor) to check-in and test prep.  Please follow these instructions carefully (unless otherwise directed):  Hold all erectile dysfunction medications at least 48 hours prior to test.  On the Night Before the Test: . Be sure to Drink plenty of water. . Do not consume any caffeinated/decaffeinated beverages or chocolate 12 hours prior to your test. . Do not take any antihistamines 12 hours prior to your test. . If you take Metformin do not take 24 hours prior to test. . If the patient has contrast allergy: ? Patient will need a prescription for Prednisone and very clear instructions  (as follows): 1. Prednisone 50 mg - take 13 hours prior to test 2. Take another Prednisone 50 mg 7 hours prior to test 3. Take another Prednisone 50 mg 1 hour prior to test 4. Take Benadryl 50 mg 1 hour prior to test . Patient must complete all four doses of above prophylactic medications. . Patient will need a ride after test due to Benadryl.  On the Day of the Test: . Drink plenty of water. Do not drink any water within one hour of the test. . Do not eat any food 4 hours prior to the test. . You may take your regular medications prior to the test.  . Take metoprolol (Lopressor) two hours prior to test. (LOPRESSOR 100MG ) . HOLD Furosemide/Hydrochlorothiazide morning of the test. . FEMALES- please wear underwire-free bra if available       After the Test: . Drink plenty of water. . After receiving IV contrast, you may experience a mild flushed feeling. This is normal. . On occasion, you may experience a mild rash up to 24 hours after the test. This is not dangerous. If this occurs, you can take Benadryl 25 mg and increase your fluid intake. . If you experience trouble breathing, this can be serious. If it is severe call 911 IMMEDIATELY. If it is mild, please call our office. . If you take any of these medications: Glipizide/Metformin, Avandament, Glucavance, please do not take 48 hours after completing test.    Please contact the cardiac imaging nurse navigator should you have any questions/concerns Marchia Bond, RN Navigator Cardiac [facility] [phone] Office  [phone] Cell

## 2019-06-17 NOTE — Progress Notes (Signed)
CARDIOLOGY CONSULT NOTE  Patient ID: Paula Steele MRN: [id number] DOB/AGE: 1946/05/13 73 y.o.  Admit date: (Not on file) Primary Physician: Octavio Graves, DO (Inactive)  Reason for Consultation: CHF  HPI: Paula Steele is a 73 y.o. female who is being seen today for the evaluation of CHF at the request of Octavio Graves, DO.   I reviewed notes from her PCP.  She has a history of insulin-dependent diabetes mellitus, obesity, anxiety, GERD, and chronic pain.  Echocardiogram on 12/07/2012 demonstrated normal LV systolic function, EF 55 to 60%.  There was a trivial pericardial effusion at that time.  She underwent a laparoscopic appendectomy earlier this year.  Patient is accompanied by her daughter today.  The patient tells me that she has had intermittent retrosternal chest pains going on for years which have progressively come worse which can occur both with and without exertion.  They radiate from the central part of her chest to the upper left side of her chest into her left shoulder and down her arm.  She has had sporadic palpitations.  She has also become progressively more more short of breath.  She denies a history of smoking.  She has had bilateral leg swelling but she tells me they are not swollen today.  Family history: Father died at the age of 27 of heart disease.  Her eldest brother has undergone CABG.  Her sister died in her late 90s of heart disease.    Allergies  Allergen Reactions  . Actos [Pioglitazone]     edema  . Bydureon [Exenatide]     nausea  . Metformin And Related     diahrrhea    Current Outpatient Medications  Medication Sig Dispense Refill  . albuterol (VENTOLIN HFA) 108 (90 Base) MCG/ACT inhaler Inhale 2 puffs into the lungs every 6 (six) hours as needed for wheezing or shortness of breath.    . cyclobenzaprine (FLEXERIL) 10 MG tablet Take 10 mg by mouth 3 (three) times daily as needed for muscle spasms.    . diazepam (VALIUM) 10  MG tablet Take 0.5 tablets (5 mg total) by mouth every 12 (twelve) hours as needed for anxiety. Muscle spasms (Patient taking differently: Take 5 mg by mouth every 8 (eight) hours as needed for anxiety. Muscle spasms) 5 tablet 0  . fluticasone-salmeterol (ADVAIR HFA) 115-21 MCG/ACT inhaler Inhale 2 puffs into the lungs as needed.    . Iron-Vitamins (GERITOL COMPLETE PO) Take 1 tablet by mouth daily.    . montelukast (SINGULAIR) 10 MG tablet Take 10 mg by mouth at bedtime.    Marland Kitchen omeprazole (PRILOSEC) 10 MG capsule Take 10 mg by mouth daily.    Marland Kitchen oxyCODONE-acetaminophen (PERCOCET) 10-325 MG tablet Take 1 tablet by mouth every 8 (eight) hours as needed.    . promethazine (PHENERGAN) 25 MG tablet Take 25 mg by mouth every 6 (six) hours as needed for nausea or vomiting.    . TRESIBA FLEXTOUCH 100 UNIT/ML SOPN FlexTouch Pen Inject 36 Units into the skin daily.    . Vilazodone HCl (VIIBRYD STARTER PACK) 10 & 20 MG KIT Take 1 tablet by mouth as directed.    . Vitamin D, Ergocalciferol, (DRISDOL) 50000 UNITS CAPS Take 50,000 Units by mouth every Sunday.     Marland Kitchen glucose blood (ACCU-CHEK GUIDE) test strip Use as instructed 150 each 2  . insulin glargine (LANTUS) 100 UNIT/ML injection Inject 60 Units into the skin at bedtime.     No  current facility-administered medications for this visit.     Past Medical History:  Diagnosis Date  . Arthritis     SHOULDERS--   IS CURRENTLY HAS LEFT SHOULDER PAIN  . Benign neoplasm of adrenal gland   . Benign neoplasm of colon   . Blood transfusion   . Bronchitis    10/2012- followed by ER and then PCP- Dr Melina Copa   . Chronic kidney disease    bladder incontinence   . Chronic meniscal tear of knee, right   . Colon polyp   . Degeneration of intervertebral disc, site unspecified    LUMBAR  . Diabetes mellitus without mention of complication    INSULIN AND ORAL MEDS  . Diverticulosis of colon (without mention of hemorrhage)   . Esophageal reflux    CONTROLLED W/  PRILOSEC  . History of neck pain    chronic left sided neck pain  . Hx: UTI (urinary tract infection)   . Hyperlipidemia   . Hypertension   . Insomnia DUE TO STRESS  . Left arm pain    and  heaviness since radical neck surg  . Neck pain, chronic S/P RADIAL NECK DISSECTION--  2007   LEFT SIDE OF NECK AND SHOULDER PAIN  . Oropharyngeal dysphagia MILD RIGHT SUPRAGLOTTIC REGION   PT STATES SHE EATS VERY SLOWLY AND CHEWS FOOD WELL  . Other chronic nonalcoholic liver disease    SEES DR Deatra Ina-- ?SCARRING OR CIRRHOSIS PER PET SCAN 2010  . Personal history of other diseases of digestive system    OCCASIONAL ABD. PAIN  . 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 Shortness of breath   . Shortness of breath on exertion   . 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 Urgency incontinence   . Vitamin D deficiency   . Weakness of both legs     Past Surgical History:  Procedure Laterality Date  . ABDOMINAL HYSTERECTOMY  1984   W/  LSO  . CATARACT EXTRACTION W/ INTRAOCULAR LENS  IMPLANT, BILATERAL    . CHOLECYSTECTOMY  15 YRS AGO  . DIRECT LARYNGOSCOPY  2007   W/ LEFT NECK MASS EXCISION  . EXCISION LEFT SEBACOUS CYST  05-14-2011 W/  LOCAL  . KNEE ARTHROSCOPY  09/26/2011   Procedure: ARTHROSCOPY KNEE;  Surgeon: Gearlean Alf;  Location: [facility];  Service: Orthopedics;  Laterality: Right;  right knee arthroscopy with meniscal tear  . KNEE ARTHROSCOPY  03/25/2012   Procedure: ARTHROSCOPY KNEE;  Surgeon: Gearlean Alf, MD;  Location: [facility];  Service: Orthopedics;  Laterality: Right;  With Debridement of medial lateral meniscus  . LAPAROSCOPIC SALPINGOOPHERECTOMY  2001   RIGHT  W/ LYSIS AHESIONS  . NECK MASS EXCISION  2007   LEFT  . RADICAL NECK DISSECTION  2007   W/ LEFT TONSILLECTOMY AND LEFT EXCISION LEFT TONGUE BASE DUE TO CANCER  . SHOULDER  SURGERY  1990   LEFT  . TONSILLECTOMY  01-31-2009   RIGHT (DUE TO MASS)  . TONSILLECTOMY  2007   LEFT W/ NECK DISSECTION DUE TO CANCER  . TOTAL KNEE ARTHROPLASTY Right 11/30/2012   Procedure: TOTAL KNEE ARTHROPLASTY;  Surgeon: Gearlean Alf, MD;  Location: [facility];  Service: Orthopedics;  Laterality: Right;    Social History   Socioeconomic History  . Marital status: Married    Spouse  name: Not on file  . Number of children: 4  . Years of education: Not on file  . Highest education level: Not on file  Occupational History  . Occupation: Disabled  Social Needs  . Financial resource strain: Not on file  . Food insecurity    Worry: Not on file    Inability: Not on file  . Transportation needs    Medical: Not on file    Non-medical: Not on file  Tobacco Use  . Smoking status: Former Smoker    Years: 15.00    Types: Cigarettes    Quit date: 09/22/1984    Years since quitting: 34.7  . Smokeless tobacco: Never Used  Substance and Sexual Activity  . Alcohol use: No  . Drug use: No  . Sexual activity: Not Currently  Lifestyle  . Physical activity    Days per week: Not on file    Minutes per session: Not on file  . Stress: Not on file  Relationships  . Social Herbalist on phone: Not on file    Gets together: Not on file    Attends religious service: Not on file    Active member of club or organization: Not on file    Attends meetings of clubs or organizations: Not on file    Relationship status: Not on file  . Intimate partner violence    Fear of current or ex partner: Not on file    Emotionally abused: Not on file    Physically abused: Not on file    Forced sexual activity: Not on file  Other Topics Concern  . Not on file  Social History Narrative  . Not on file      Current Meds  Medication Sig  . albuterol (VENTOLIN HFA) 108 (90 Base) MCG/ACT inhaler Inhale 2 puffs into the lungs every 6 (six) hours as needed for wheezing or shortness of breath.   . cyclobenzaprine (FLEXERIL) 10 MG tablet Take 10 mg by mouth 3 (three) times daily as needed for muscle spasms.  . diazepam (VALIUM) 10 MG tablet Take 0.5 tablets (5 mg total) by mouth every 12 (twelve) hours as needed for anxiety. Muscle spasms (Patient taking differently: Take 5 mg by mouth every 8 (eight) hours as needed for anxiety. Muscle spasms)  . fluticasone-salmeterol (ADVAIR HFA) 115-21 MCG/ACT inhaler Inhale 2 puffs into the lungs as needed.  . Iron-Vitamins (GERITOL COMPLETE PO) Take 1 tablet by mouth daily.  . montelukast (SINGULAIR) 10 MG tablet Take 10 mg by mouth at bedtime.  Marland Kitchen omeprazole (PRILOSEC) 10 MG capsule Take 10 mg by mouth daily.  Marland Kitchen oxyCODONE-acetaminophen (PERCOCET) 10-325 MG tablet Take 1 tablet by mouth every 8 (eight) hours as needed.  . promethazine (PHENERGAN) 25 MG tablet Take 25 mg by mouth every 6 (six) hours as needed for nausea or vomiting.  . TRESIBA FLEXTOUCH 100 UNIT/ML SOPN FlexTouch Pen Inject 36 Units into the skin daily.  . Vilazodone HCl (VIIBRYD STARTER PACK) 10 & 20 MG KIT Take 1 tablet by mouth as directed.  . Vitamin D, Ergocalciferol, (DRISDOL) 50000 UNITS CAPS Take 50,000 Units by mouth every Sunday.       Review of systems complete and found to be negative unless listed above in HPI    Physical exam Blood pressure 122/82, pulse 90, height '5\' 8"'$  (1.727 m), weight 239 lb 3.2 oz (108.5 kg), SpO2 97 %. General: NAD Neck: No JVD, no thyromegaly or thyroid nodule.  Lungs: Clear  to auscultation bilaterally with normal respiratory effort. CV: Nondisplaced PMI. Regular rate and rhythm, normal S1/S2, no S3/S4, no murmur.  No peripheral edema.  Bilateral venous varicosities.  No carotid bruit.    Abdomen: Soft, nontender, no distention.  Skin: Intact without lesions or rashes.  Neurologic: Alert and oriented x 3.  Psych: Somewhat flat affect. Extremities: No clubbing or cyanosis.  HEENT: Normal.   ECG: Sinus rhythm with nonspecific ST segment  and T wave abnormalities.   Labs: Lab Results  Component Value Date/Time   K 4.6 04/09/2016 10:26 AM   BUN 14 04/09/2016 10:26 AM   CREATININE 0.71 04/09/2016 10:26 AM   ALT 18 12/07/2012 05:16 AM   TSH 1.171 12/07/2012 05:16 AM   HGB 14.7 04/09/2016 10:26 AM     Lipids: Lab Results  Component Value Date/Time   LDLCALC 44 08/07/2016   CHOL 120 08/07/2016   TRIG 132 08/07/2016   HDL 50 08/07/2016        ASSESSMENT AND PLAN:   1.  Chest pain and shortness of breath: Symptoms are suspicious for obstructive coronary artery disease.  I will proceed with coronary CT angiography. I will order a 2-D echocardiogram with Doppler to evaluate cardiac structure, function, and regional wall motion. I will obtain a copy of labs from PCP.  2.  Insulin-dependent diabetes mellitus: I will obtain copy of most recent labs including A1c from PCP.   Disposition: Follow up in 3 months  Signed: Kate Sable, M.D., F.A.C.C.  06/17/2019, 2:06 PM

## 2019-07-08 ENCOUNTER — Telehealth: Payer: Self-pay | Admitting: Cardiovascular Disease

## 2019-07-08 ENCOUNTER — Other Ambulatory Visit: Payer: Medicare Other

## 2019-07-08 NOTE — Telephone Encounter (Signed)
Asking what prep is needed for  CT Cardiac for Monday

## 2019-07-08 NOTE — Telephone Encounter (Signed)
Spoke with daughter - patient thought she was supposed to drink something.  Informed her that instruction sheet was given to her during OV (she does have this) - just wanted to make sure she was not missing something.  Informed her that the only thing to be drinking is lots of water & to follow all other instructions on the sheet.  She will be getting her labs done tomorrow.  Daughter verbalized understanding.

## 2019-07-09 ENCOUNTER — Telehealth (HOSPITAL_COMMUNITY): Payer: Self-pay | Admitting: Emergency Medicine

## 2019-07-09 ENCOUNTER — Other Ambulatory Visit (HOSPITAL_COMMUNITY)
Admission: RE | Admit: 2019-07-09 | Discharge: 2019-07-09 | Disposition: A | Discharge status: Home / Self Care | Payer: Medicare Other | Source: Other Acute Inpatient Hospital | Attending: Cardiovascular Disease | Admitting: Cardiovascular Disease

## 2019-07-09 DIAGNOSIS — I10 Essential (primary) hypertension: Secondary | ICD-10-CM | POA: Insufficient documentation

## 2019-07-09 DIAGNOSIS — R079 Chest pain, unspecified: Secondary | ICD-10-CM | POA: Diagnosis not present

## 2019-07-09 DIAGNOSIS — R0602 Shortness of breath: Secondary | ICD-10-CM | POA: Insufficient documentation

## 2019-07-09 DIAGNOSIS — Z01812 Encounter for preprocedural laboratory examination: Secondary | ICD-10-CM | POA: Diagnosis present

## 2019-07-09 LAB — BASIC METABOLIC PANEL
Anion gap: 10 (ref 5–15)
BUN: 15 mg/dL (ref 8–23)
CO2: 21 mmol/L — ABNORMAL LOW (ref 22–32)
Calcium: 9.1 mg/dL (ref 8.9–10.3)
Chloride: 101 mmol/L (ref 98–111)
Creatinine, Ser: 0.79 mg/dL (ref 0.44–1.00)
GFR calc Af Amer: >60 mL/min (ref >60)
GFR calc non Af Amer: >60 mL/min (ref >60)
Glucose, Bld: 409 mg/dL — ABNORMAL HIGH (ref 70–99)
Potassium: 4 mmol/L (ref 3.5–5.1)
Sodium: 132 mmol/L — ABNORMAL LOW (ref 135–145)

## 2019-07-09 NOTE — Telephone Encounter (Signed)
Reaching out to patient to offer assistance regarding upcoming cardiac imaging study; pt verbalizes understanding of appt date/time, parking situation and where to check in, pre-test NPO status and medications ordered, and verified current allergies; name and call back number provided for further questions should they arise Marelin Tat RN Navigator Cardiac Imaging [facility] [phone] office [phone] cell 

## 2019-07-12 ENCOUNTER — Ambulatory Visit (HOSPITAL_COMMUNITY): Admission: RE | Admit: 2019-07-12 | Payer: Medicare Other | Source: Ambulatory Visit

## 2019-07-12 ENCOUNTER — Telehealth: Payer: Self-pay | Admitting: *Deleted

## 2019-07-12 NOTE — Telephone Encounter (Signed)
-----   Message from Herminio Commons, MD sent at 07/12/2019  8:20 AM EDT ----- ok

## 2019-07-12 NOTE — Telephone Encounter (Signed)
Called patient with test results. No answer. Left message to call back.  

## 2019-07-14 ENCOUNTER — Telehealth (HOSPITAL_COMMUNITY): Payer: Self-pay | Admitting: Emergency Medicine

## 2019-07-14 NOTE — Telephone Encounter (Signed)
Reaching out to patient to offer assistance regarding upcoming cardiac imaging study; pt verbalizes understanding of appt date/time, parking situation and where to check in, pre-test NPO status and medications ordered, and verified current allergies; name and call back number provided for further questions should they arise Cookie Pore RN Navigator Cardiac Imaging [facility] [phone] office [phone] cell 

## 2019-07-15 ENCOUNTER — Ambulatory Visit (HOSPITAL_COMMUNITY)
Admission: RE | Admit: 2019-07-15 | Discharge: 2019-07-15 | Disposition: A | Discharge status: Home / Self Care | Payer: Medicare Other | Source: Ambulatory Visit | Attending: Cardiovascular Disease | Admitting: Cardiovascular Disease

## 2019-07-15 ENCOUNTER — Other Ambulatory Visit: Payer: Self-pay

## 2019-07-15 DIAGNOSIS — R079 Chest pain, unspecified: Secondary | ICD-10-CM | POA: Insufficient documentation

## 2019-07-15 DIAGNOSIS — R0602 Shortness of breath: Secondary | ICD-10-CM | POA: Diagnosis present

## 2019-07-15 NOTE — Progress Notes (Signed)
*  PRELIMINARY RESULTS* Echocardiogram 2D Echocardiogram has been performed.  Paula Steele 07/15/2019, 3:32 PM

## 2019-07-16 ENCOUNTER — Ambulatory Visit (HOSPITAL_COMMUNITY)
Admission: RE | Admit: 2019-07-16 | Discharge: 2019-07-16 | Disposition: A | Discharge status: Home / Self Care | Payer: Medicare Other | Source: Ambulatory Visit | Attending: Cardiovascular Disease | Admitting: Cardiovascular Disease

## 2019-07-16 ENCOUNTER — Other Ambulatory Visit: Payer: Self-pay

## 2019-07-16 DIAGNOSIS — R072 Precordial pain: Secondary | ICD-10-CM | POA: Insufficient documentation

## 2019-07-16 DIAGNOSIS — R0602 Shortness of breath: Secondary | ICD-10-CM

## 2019-07-16 MED ORDER — NITROGLYCERIN 0.4 MG SL SUBL
0.8000 mg | SUBLINGUAL_TABLET | SUBLINGUAL | Status: DC | PRN
  Filled 2019-07-16: qty 25

## 2019-07-16 MED ORDER — METOPROLOL TARTRATE 5 MG/5ML IV SOLN
INTRAVENOUS | Status: AC
  Administered 2019-07-16: 5 mg
  Filled 2019-07-16: qty 5

## 2019-07-16 MED ORDER — DILTIAZEM HCL 25 MG/5ML IV SOLN
10.0000 mg | Freq: Once | INTRAVENOUS | Status: DC
  Filled 2019-07-16: qty 5

## 2019-07-16 MED ORDER — METOPROLOL TARTRATE 5 MG/5ML IV SOLN
5.0000 mg | INTRAVENOUS | Status: DC | PRN
  Administered 2019-07-16: 16:00:00 5 mg via INTRAVENOUS
  Filled 2019-07-16 (×2): qty 5

## 2019-08-17 ENCOUNTER — Encounter (INDEPENDENT_AMBULATORY_CARE_PROVIDER_SITE_OTHER): Payer: Self-pay

## 2019-08-20 ENCOUNTER — Telehealth: Payer: Medicare Other | Admitting: Cardiovascular Disease

## 2020-04-19 ENCOUNTER — Emergency Department (HOSPITAL_COMMUNITY): Payer: Medicare Other

## 2020-04-19 ENCOUNTER — Encounter (HOSPITAL_COMMUNITY): Payer: Self-pay | Admitting: *Deleted

## 2020-04-19 ENCOUNTER — Emergency Department (HOSPITAL_COMMUNITY)
Admission: EM | Admit: 2020-04-19 | Discharge: 2020-04-19 | Discharge status: Against Medical Advice | Payer: Medicare Other | Attending: Emergency Medicine | Admitting: Emergency Medicine

## 2020-04-19 ENCOUNTER — Other Ambulatory Visit: Payer: Self-pay

## 2020-04-19 DIAGNOSIS — S80911A Unspecified superficial injury of right knee, initial encounter: Secondary | ICD-10-CM | POA: Diagnosis present

## 2020-04-19 DIAGNOSIS — Z20822 Contact with and (suspected) exposure to covid-19: Secondary | ICD-10-CM | POA: Insufficient documentation

## 2020-04-19 DIAGNOSIS — R519 Headache, unspecified: Secondary | ICD-10-CM

## 2020-04-19 DIAGNOSIS — Z96651 Presence of right artificial knee joint: Secondary | ICD-10-CM | POA: Diagnosis not present

## 2020-04-19 DIAGNOSIS — Z87891 Personal history of nicotine dependence: Secondary | ICD-10-CM | POA: Insufficient documentation

## 2020-04-19 DIAGNOSIS — Y939 Activity, unspecified: Secondary | ICD-10-CM | POA: Insufficient documentation

## 2020-04-19 DIAGNOSIS — R0602 Shortness of breath: Secondary | ICD-10-CM

## 2020-04-19 DIAGNOSIS — E119 Type 2 diabetes mellitus without complications: Secondary | ICD-10-CM | POA: Diagnosis not present

## 2020-04-19 DIAGNOSIS — Y929 Unspecified place or not applicable: Secondary | ICD-10-CM | POA: Insufficient documentation

## 2020-04-19 DIAGNOSIS — W19XXXA Unspecified fall, initial encounter: Secondary | ICD-10-CM | POA: Insufficient documentation

## 2020-04-19 DIAGNOSIS — Z86018 Personal history of other benign neoplasm: Secondary | ICD-10-CM | POA: Insufficient documentation

## 2020-04-19 DIAGNOSIS — Z794 Long term (current) use of insulin: Secondary | ICD-10-CM | POA: Insufficient documentation

## 2020-04-19 DIAGNOSIS — I10 Essential (primary) hypertension: Secondary | ICD-10-CM | POA: Diagnosis not present

## 2020-04-19 DIAGNOSIS — Y999 Unspecified external cause status: Secondary | ICD-10-CM | POA: Diagnosis not present

## 2020-04-19 DIAGNOSIS — Z85818 Personal history of malignant neoplasm of other sites of lip, oral cavity, and pharynx: Secondary | ICD-10-CM | POA: Diagnosis not present

## 2020-04-19 DIAGNOSIS — Z86010 Personal history of colonic polyps: Secondary | ICD-10-CM | POA: Insufficient documentation

## 2020-04-19 DIAGNOSIS — S86911A Strain of unspecified muscle(s) and tendon(s) at lower leg level, right leg, initial encounter: Secondary | ICD-10-CM | POA: Diagnosis not present

## 2020-04-19 LAB — CBC WITH DIFFERENTIAL/PLATELET
Abs Immature Granulocytes: 0.01 10*3/uL (ref 0.00–0.07)
Basophils Absolute: 0 10*3/uL (ref 0.0–0.1)
Basophils Relative: 1 %
Eosinophils Absolute: 0.1 10*3/uL (ref 0.0–0.5)
Eosinophils Relative: 2 %
HCT: 43.5 % (ref 36.0–46.0)
Hemoglobin: 14.3 g/dL (ref 12.0–15.0)
Immature Granulocytes: 0 %
Lymphocytes Relative: 32 %
Lymphs Abs: 1.9 10*3/uL (ref 0.7–4.0)
MCH: 29.2 pg (ref 26.0–34.0)
MCHC: 32.9 g/dL (ref 30.0–36.0)
MCV: 88.8 fL (ref 80.0–100.0)
Monocytes Absolute: 0.2 10*3/uL (ref 0.1–1.0)
Monocytes Relative: 4 %
Neutro Abs: 3.7 10*3/uL (ref 1.7–7.7)
Neutrophils Relative %: 61 %
Platelets: 143 10*3/uL — ABNORMAL LOW (ref 150–400)
RBC: 4.9 MIL/uL (ref 3.87–5.11)
RDW: 13.7 % (ref 11.5–15.5)
WBC: 6 10*3/uL (ref 4.0–10.5)
nRBC: 0 % (ref 0.0–0.2)

## 2020-04-19 LAB — COMPREHENSIVE METABOLIC PANEL
ALT: 32 U/L (ref 0–44)
AST: 37 U/L (ref 15–41)
Albumin: 4 g/dL (ref 3.5–5.0)
Alkaline Phosphatase: 119 U/L (ref 38–126)
Anion gap: 10 (ref 5–15)
BUN: 12 mg/dL (ref 8–23)
CO2: 25 mmol/L (ref 22–32)
Calcium: 9.4 mg/dL (ref 8.9–10.3)
Chloride: 105 mmol/L (ref 98–111)
Creatinine, Ser: 0.6 mg/dL (ref 0.44–1.00)
GFR calc Af Amer: >60 mL/min (ref >60)
GFR calc non Af Amer: >60 mL/min (ref >60)
Glucose, Bld: 145 mg/dL — ABNORMAL HIGH (ref 70–99)
Potassium: 4 mmol/L (ref 3.5–5.1)
Sodium: 140 mmol/L (ref 135–145)
Total Bilirubin: 1 mg/dL (ref 0.3–1.2)
Total Protein: 7.9 g/dL (ref 6.5–8.1)

## 2020-04-19 LAB — SARS CORONAVIRUS 2 BY RT PCR (HOSPITAL ORDER, PERFORMED IN ~~LOC~~ HOSPITAL LAB): SARS Coronavirus 2: NEGATIVE

## 2020-04-19 LAB — TROPONIN I (HIGH SENSITIVITY): Troponin I (High Sensitivity): 5 ng/L (ref <18)

## 2020-04-19 MED ORDER — SODIUM CHLORIDE 0.9 % IV BOLUS
500.0000 mL | Freq: Once | INTRAVENOUS | Status: AC
  Administered 2020-04-19: 500 mL via INTRAVENOUS

## 2020-04-19 MED ORDER — ONDANSETRON HCL 4 MG/2ML IJ SOLN
4.0000 mg | Freq: Once | INTRAMUSCULAR | Status: AC
  Administered 2020-04-19: 4 mg via INTRAVENOUS
  Filled 2020-04-19: qty 2

## 2020-04-19 NOTE — ED Notes (Signed)
Pt in bed, pt c/o R knee pain and a headache, pt has some tenderness to her R knee. resps even and unlabored,

## 2020-04-19 NOTE — ED Triage Notes (Signed)
Pt c/o migraine headache along with nausea that has been going on for a few days. Pt also c/o right knee pain after it gave out a few days ago.

## 2020-04-19 NOTE — ED Provider Notes (Signed)
[facility] Provider Note   CSN: [id number] Arrival date & time: 04/19/20  1623     History Chief Complaint  Patient presents with  . Migraine  . Knee Pain    Paula Steele is a 74 y.o. female.  Pt presents to the ED today with multiple complaints.  Pt said she fell a few days ago and hurt her right knee.  She also has a headache, has cp, and sob.  She has been able to ambulate with a cane.  She denies hitting her head when she fell.  She does not have a hx of migraines.  She has no cp or sob now.  She just feels tired.  Pt has not been vaccinated against Covid.  Pt denies any f/c.        Past Medical History:  Diagnosis Date  . Arthritis     SHOULDERS--   IS CURRENTLY HAS LEFT SHOULDER PAIN  . Benign neoplasm of adrenal gland   . Benign neoplasm of colon   . Blood transfusion   . Bronchitis    10/2012- followed by ER and then PCP- Dr Melina Copa   . Chronic kidney disease    bladder incontinence   . Chronic meniscal tear of knee, right   . Colon polyp   . Degeneration of intervertebral disc, site unspecified    LUMBAR  . Diabetes mellitus without mention of complication    INSULIN AND ORAL MEDS  . Diverticulosis of colon (without mention of hemorrhage)   . Esophageal reflux    CONTROLLED W/ PRILOSEC  . History of neck pain    chronic left sided neck pain  . Hx: UTI (urinary tract infection)   . Hyperlipidemia   . Hypertension   . Insomnia DUE TO STRESS  . Left arm pain    and  heaviness since radical neck surg  . Neck pain, chronic S/P RADIAL NECK DISSECTION--  2007   LEFT SIDE OF NECK AND SHOULDER PAIN  . Oropharyngeal dysphagia MILD RIGHT SUPRAGLOTTIC REGION   PT STATES SHE EATS VERY SLOWLY AND CHEWS FOOD WELL  . Other chronic nonalcoholic liver disease    SEES DR Deatra Ina-- ?SCARRING OR CIRRHOSIS PER PET SCAN 2010  . Personal history of other diseases of digestive system    OCCASIONAL ABD. PAIN  . 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 Shortness of breath   . Shortness of breath on exertion   . 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 Urgency incontinence   . Vitamin D deficiency   . Weakness of both legs     Patient Active Problem List   Diagnosis Date Noted  . Hypertension   . Shortness of breath 12/30/2013  . Hyperlipidemia 12/30/2013  . Acute encephalopathy 12/07/2012  . CVA (cerebral infarction) 12/07/2012  . OA (osteoarthritis) of knee 11/30/2012  . Acute medial meniscal tear 03/25/2012  . Personal history of colonic polyps 08/15/2011  . Abdominal pain, left lower quadrant 08/15/2011  . Cirrhosis of liver without mention of alcohol 08/15/2011  . Obesity 08/15/2011  . 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 DEPRESSION 04/05/2008  . CAD 04/05/2008  . GERD 04/05/2008  . GASTROPARESIS 04/05/2008  . OTHER CHRONIC NONALCOHOLIC LIVER DISEASE 73/71/0626  . [place] DISEASE 04/05/2008  . BENIGN NEOPLASM OF ADRENAL GLAND 03/31/2007  .  DIVERTICULOSIS OF COLON 09/06/2004  . COLONIC POLYPS 02/07/2003    Past Surgical History:  Procedure Laterality Date  . ABDOMINAL HYSTERECTOMY  1984   W/  LSO  . CATARACT EXTRACTION W/ INTRAOCULAR LENS  IMPLANT, BILATERAL    . CHOLECYSTECTOMY  15 YRS AGO  . DIRECT LARYNGOSCOPY  2007   W/ LEFT NECK MASS EXCISION  . EXCISION LEFT SEBACOUS CYST  05-14-2011 W/  LOCAL  . KNEE ARTHROSCOPY  09/26/2011   Procedure: ARTHROSCOPY KNEE;  Surgeon: Gearlean Alf;  Location: [facility];  Service: Orthopedics;  Laterality: Right;  right knee arthroscopy with meniscal tear  . KNEE ARTHROSCOPY  03/25/2012   Procedure: ARTHROSCOPY KNEE;  Surgeon: Gearlean Alf, MD;  Location: [facility];  Service: Orthopedics;  Laterality: Right;  With Debridement of medial lateral meniscus  . LAPAROSCOPIC SALPINGOOPHERECTOMY  2001   RIGHT  W/ LYSIS  AHESIONS  . NECK MASS EXCISION  2007   LEFT  . RADICAL NECK DISSECTION  2007   W/ LEFT TONSILLECTOMY AND LEFT EXCISION LEFT TONGUE BASE DUE TO CANCER  . SHOULDER SURGERY  1990   LEFT  . TONSILLECTOMY  01-31-2009   RIGHT (DUE TO MASS)  . TONSILLECTOMY  2007   LEFT W/ NECK DISSECTION DUE TO CANCER  . TOTAL KNEE ARTHROPLASTY Right 11/30/2012   Procedure: TOTAL KNEE ARTHROPLASTY;  Surgeon: Gearlean Alf, MD;  Location: [facility];  Service: Orthopedics;  Laterality: Right;     OB History   No obstetric history on file.     Family History  Problem Relation Age of Onset  . Thyroid cancer Brother   . Diabetes Mother   . Diabetes Brother   . Heart disease Father   . Heart disease Brother     Social History   Tobacco Use  . Smoking status: Former Smoker    Years: 15.00    Types: Cigarettes    Quit date: 09/22/1984    Years since quitting: 35.5  . Smokeless tobacco: Never Used  Substance Use Topics  . Alcohol use: No  . Drug use: No    Home Medications Prior to Admission medications   Medication Sig Start Date End Date Taking? Authorizing Provider  albuterol (VENTOLIN HFA) 108 (90 Base) MCG/ACT inhaler Inhale 2 puffs into the lungs every 6 (six) hours as needed for wheezing or shortness of breath.    [provider]  cyclobenzaprine (FLEXERIL) 10 MG tablet Take 10 mg by mouth 3 (three) times daily as needed for muscle spasms.    [provider]  diazepam (VALIUM) 10 MG tablet Take 0.5 tablets (5 mg total) by mouth every 12 (twelve) hours as needed for anxiety. Muscle spasms Patient taking differently: Take 5 mg by mouth every 8 (eight) hours as needed for anxiety. Muscle spasms 12/08/12   Dhungel, Flonnie Overman, MD  fluticasone-salmeterol (ADVAIR HFA) 115-21 MCG/ACT inhaler Inhale 2 puffs into the lungs as needed.    [provider]  glucose blood (ACCU-CHEK GUIDE) test strip Use as instructed 02/21/17   Cassandria Anger, MD  insulin glargine (LANTUS) 100  UNIT/ML injection Inject 60 Units into the skin at bedtime.    [provider]  Iron-Vitamins (GERITOL COMPLETE PO) Take 1 tablet by mouth daily.    [provider]  metoprolol tartrate (LOPRESSOR) 100 MG tablet Take 1 tablet (100 mg total) by mouth once for 1 dose. 06/17/19 06/17/19  Herminio Commons, MD  montelukast (SINGULAIR) 10 MG tablet Take 10 mg  by mouth at bedtime.    [provider]  omeprazole (PRILOSEC) 10 MG capsule Take 10 mg by mouth daily.    [provider]  oxyCODONE-acetaminophen (PERCOCET) 10-325 MG tablet Take 1 tablet by mouth every 8 (eight) hours as needed. 05/19/19   [provider]  promethazine (PHENERGAN) 25 MG tablet Take 25 mg by mouth every 6 (six) hours as needed for nausea or vomiting.    [provider]  TRESIBA FLEXTOUCH 100 UNIT/ML SOPN FlexTouch Pen Inject 36 Units into the skin daily. 04/28/19   [provider]  Vilazodone HCl (VIIBRYD STARTER PACK) 10 & 20 MG KIT Take 1 tablet by mouth as directed.    [provider]  Vitamin D, Ergocalciferol, (DRISDOL) 50000 UNITS CAPS Take 50,000 Units by mouth every Sunday.     [provider]    Allergies    Actos [pioglitazone], Bydureon [exenatide], and Metformin and related  Review of Systems   Review of Systems  Respiratory: Positive for shortness of breath.   Cardiovascular: Positive for chest pain.  Musculoskeletal:       Right knee pain  Neurological: Positive for headaches.  All other systems reviewed and are negative.   Physical Exam Updated Vital Signs BP (!) 135/94 (BP Location: Left Arm)   Pulse 77   Temp (!) 97.4 F (36.3 C) (Oral)   Resp 18   Ht '5\' 8"'$  (1.727 m)   Wt 99.3 kg   SpO2 100%   BMI 33.30 kg/m   Physical Exam Vitals and nursing note reviewed.  Constitutional:      Appearance: Normal appearance.  HENT:     Head: Normocephalic and atraumatic.     Right Ear: External ear normal.     Left Ear:  External ear normal.     Nose: Nose normal.     Mouth/Throat:     Mouth: Mucous membranes are moist.     Pharynx: Oropharynx is clear.  Eyes:     Extraocular Movements: Extraocular movements intact.     Conjunctiva/sclera: Conjunctivae normal.     Pupils: Pupils are equal, round, and reactive to light.  Cardiovascular:     Rate and Rhythm: Normal rate and regular rhythm.     Pulses: Normal pulses.     Heart sounds: Normal heart sounds.  Pulmonary:     Effort: Pulmonary effort is normal.     Breath sounds: Normal breath sounds.  Abdominal:     General: Abdomen is flat. Bowel sounds are normal.     Palpations: Abdomen is soft.  Musculoskeletal:     Cervical back: Normal range of motion and neck supple.  Skin:    General: Skin is warm.     Capillary Refill: Capillary refill takes less than 2 seconds.  Neurological:     General: No focal deficit present.     Mental Status: She is alert and oriented to person, place, and time.  Psychiatric:        Mood and Affect: Mood normal.        Behavior: Behavior normal.        Thought Content: Thought content normal.        Judgment: Judgment normal.     ED Results / Procedures / Treatments   Labs (all labs ordered are listed, but only abnormal results are displayed) Labs Reviewed  COMPREHENSIVE METABOLIC PANEL - Abnormal; Notable for the following components:      Result Value   Glucose, Bld 145 (*)  All other components within normal limits  CBC WITH DIFFERENTIAL/PLATELET - Abnormal; Notable for the following components:   Platelets 143 (*)    All other components within normal limits  SARS CORONAVIRUS 2 BY RT PCR (HOSPITAL ORDER, [facility])  URINALYSIS, ROUTINE W REFLEX MICROSCOPIC  TROPONIN I (HIGH SENSITIVITY)    EKG None  Radiology DG Chest Port 1 View  Result Date: 04/19/2020 CLINICAL DATA:  Shortness of breath. EXAM: PORTABLE CHEST 1 VIEW COMPARISON:  Radiograph and CT 03/16/2020  FINDINGS: Upper normal heart size.The cardiomediastinal contours are unchanged. Subsegmental atelectasis at the left lung base. Pulmonary vasculature is normal. No consolidation, pleural effusion, or pneumothorax. No acute osseous abnormalities are seen. IMPRESSION: Subsegmental left basilar atelectasis. Electronically Signed   By: Keith Rake M.D.   On: 04/19/2020 21:38   DG Knee Complete 4 Views Right  Result Date: 04/19/2020 CLINICAL DATA:  Fall, right knee gave out EXAM: RIGHT KNEE - COMPLETE 4+ VIEW COMPARISON:  None. FINDINGS: Prior right knee replacement. No hardware complicating feature. No acute bony abnormality. Specifically, no fracture, subluxation, or dislocation. IMPRESSION: Right knee replacement.  No acute bony abnormality. Electronically Signed   By: Rolm Baptise M.D.   On: 04/19/2020 19:00    Procedures Procedures (including critical care time)  Medications Ordered in ED Medications  ondansetron (ZOFRAN) injection 4 mg (4 mg Intravenous Given 04/19/20 2121)  sodium chloride 0.9 % bolus 500 mL (0 mLs Intravenous Stopped 04/19/20 2157)    ED Course  I have reviewed the triage vital signs and the nursing notes.  Pertinent labs & imaging results that were available during my care of the patient were reviewed by me and considered in my medical decision making (see chart for details).    MDM Rules/Calculators/A&P                         Pt given IVFs and zofran.  She told the nurse she did not want to wait for her lab work to come back.  She refused a head CT.  She just wanted to go home and go to sleep.  I did go talk with pt and she still just wanted to go home.  Pt will go home AMA and knows to return if she worsens.  F/u with pcp.  Final Clinical Impression(s) / ED Diagnoses Final diagnoses:  Acute nonintractable headache, unspecified headache type  Strain of right knee, initial encounter    Rx / DC Orders ED Discharge Orders    None       Isla Pence,  MD 04/19/20 2343

## 2020-06-20 ENCOUNTER — Ambulatory Visit (INDEPENDENT_AMBULATORY_CARE_PROVIDER_SITE_OTHER): Payer: Medicare Other | Admitting: Otolaryngology

## 2020-12-10 ENCOUNTER — Emergency Department (HOSPITAL_COMMUNITY): Payer: Medicare Other

## 2020-12-10 ENCOUNTER — Inpatient Hospital Stay (HOSPITAL_COMMUNITY)
Admission: EM | Admit: 2020-12-10 | Discharge: 2020-12-18 | DRG: 481 | Disposition: A | Discharge status: Skilled Nursing Facility | Payer: Medicare Other | Attending: Internal Medicine | Admitting: Internal Medicine

## 2020-12-10 DIAGNOSIS — M79604 Pain in right leg: Secondary | ICD-10-CM | POA: Diagnosis not present

## 2020-12-10 DIAGNOSIS — Z419 Encounter for procedure for purposes other than remedying health state, unspecified: Secondary | ICD-10-CM

## 2020-12-10 DIAGNOSIS — Z8719 Personal history of other diseases of the digestive system: Secondary | ICD-10-CM

## 2020-12-10 DIAGNOSIS — N189 Chronic kidney disease, unspecified: Secondary | ICD-10-CM | POA: Diagnosis present

## 2020-12-10 DIAGNOSIS — Z8249 Family history of ischemic heart disease and other diseases of the circulatory system: Secondary | ICD-10-CM

## 2020-12-10 DIAGNOSIS — Z9842 Cataract extraction status, left eye: Secondary | ICD-10-CM

## 2020-12-10 DIAGNOSIS — S7291XA Unspecified fracture of right femur, initial encounter for closed fracture: Secondary | ICD-10-CM

## 2020-12-10 DIAGNOSIS — E1165 Type 2 diabetes mellitus with hyperglycemia: Secondary | ICD-10-CM | POA: Diagnosis present

## 2020-12-10 DIAGNOSIS — Z86010 Personal history of colonic polyps: Secondary | ICD-10-CM

## 2020-12-10 DIAGNOSIS — Z9841 Cataract extraction status, right eye: Secondary | ICD-10-CM

## 2020-12-10 DIAGNOSIS — I10 Essential (primary) hypertension: Secondary | ICD-10-CM | POA: Diagnosis present

## 2020-12-10 DIAGNOSIS — Z961 Presence of intraocular lens: Secondary | ICD-10-CM | POA: Diagnosis present

## 2020-12-10 DIAGNOSIS — Z7951 Long term (current) use of inhaled steroids: Secondary | ICD-10-CM

## 2020-12-10 DIAGNOSIS — M9711XA Periprosthetic fracture around internal prosthetic right knee joint, initial encounter: Secondary | ICD-10-CM | POA: Diagnosis present

## 2020-12-10 DIAGNOSIS — Z20822 Contact with and (suspected) exposure to covid-19: Secondary | ICD-10-CM | POA: Diagnosis present

## 2020-12-10 DIAGNOSIS — E669 Obesity, unspecified: Secondary | ICD-10-CM | POA: Diagnosis present

## 2020-12-10 DIAGNOSIS — E785 Hyperlipidemia, unspecified: Secondary | ICD-10-CM | POA: Diagnosis present

## 2020-12-10 DIAGNOSIS — K219 Gastro-esophageal reflux disease without esophagitis: Secondary | ICD-10-CM | POA: Diagnosis present

## 2020-12-10 DIAGNOSIS — S72401A Unspecified fracture of lower end of right femur, initial encounter for closed fracture: Principal | ICD-10-CM | POA: Diagnosis present

## 2020-12-10 DIAGNOSIS — D3500 Benign neoplasm of unspecified adrenal gland: Secondary | ICD-10-CM | POA: Diagnosis present

## 2020-12-10 DIAGNOSIS — E11319 Type 2 diabetes mellitus with unspecified diabetic retinopathy without macular edema: Secondary | ICD-10-CM | POA: Diagnosis present

## 2020-12-10 DIAGNOSIS — Z85810 Personal history of malignant neoplasm of tongue: Secondary | ICD-10-CM

## 2020-12-10 DIAGNOSIS — Z833 Family history of diabetes mellitus: Secondary | ICD-10-CM

## 2020-12-10 DIAGNOSIS — E1122 Type 2 diabetes mellitus with diabetic chronic kidney disease: Secondary | ICD-10-CM | POA: Diagnosis present

## 2020-12-10 DIAGNOSIS — T148XXA Other injury of unspecified body region, initial encounter: Secondary | ICD-10-CM

## 2020-12-10 DIAGNOSIS — Z794 Long term (current) use of insulin: Secondary | ICD-10-CM

## 2020-12-10 DIAGNOSIS — Z87891 Personal history of nicotine dependence: Secondary | ICD-10-CM

## 2020-12-10 DIAGNOSIS — I129 Hypertensive chronic kidney disease with stage 1 through stage 4 chronic kidney disease, or unspecified chronic kidney disease: Secondary | ICD-10-CM | POA: Diagnosis present

## 2020-12-10 DIAGNOSIS — G4700 Insomnia, unspecified: Secondary | ICD-10-CM | POA: Diagnosis present

## 2020-12-10 DIAGNOSIS — W010XXA Fall on same level from slipping, tripping and stumbling without subsequent striking against object, initial encounter: Secondary | ICD-10-CM | POA: Diagnosis present

## 2020-12-10 DIAGNOSIS — E559 Vitamin D deficiency, unspecified: Secondary | ICD-10-CM | POA: Diagnosis present

## 2020-12-10 DIAGNOSIS — K7581 Nonalcoholic steatohepatitis (NASH): Secondary | ICD-10-CM | POA: Diagnosis present

## 2020-12-10 DIAGNOSIS — D62 Acute posthemorrhagic anemia: Secondary | ICD-10-CM | POA: Diagnosis not present

## 2020-12-10 DIAGNOSIS — Z808 Family history of malignant neoplasm of other organs or systems: Secondary | ICD-10-CM

## 2020-12-10 DIAGNOSIS — Z888 Allergy status to other drugs, medicaments and biological substances status: Secondary | ICD-10-CM

## 2020-12-10 DIAGNOSIS — K7460 Unspecified cirrhosis of liver: Secondary | ICD-10-CM | POA: Diagnosis present

## 2020-12-10 DIAGNOSIS — G894 Chronic pain syndrome: Secondary | ICD-10-CM | POA: Diagnosis present

## 2020-12-10 DIAGNOSIS — K5730 Diverticulosis of large intestine without perforation or abscess without bleeding: Secondary | ICD-10-CM | POA: Diagnosis present

## 2020-12-10 DIAGNOSIS — E119 Type 2 diabetes mellitus without complications: Secondary | ICD-10-CM

## 2020-12-10 DIAGNOSIS — Z79899 Other long term (current) drug therapy: Secondary | ICD-10-CM

## 2020-12-10 DIAGNOSIS — Z90710 Acquired absence of both cervix and uterus: Secondary | ICD-10-CM

## 2020-12-10 DIAGNOSIS — R338 Other retention of urine: Secondary | ICD-10-CM | POA: Diagnosis not present

## 2020-12-10 DIAGNOSIS — M62838 Other muscle spasm: Secondary | ICD-10-CM | POA: Diagnosis present

## 2020-12-10 HISTORY — DX: Unspecified fracture of right femur, initial encounter for closed fracture: S72.91XA

## 2020-12-10 MED ORDER — ONDANSETRON HCL 4 MG/2ML IJ SOLN
4.0000 mg | Freq: Once | INTRAMUSCULAR | Status: AC
  Administered 2020-12-10: 4 mg via INTRAVENOUS
  Filled 2020-12-10: qty 2

## 2020-12-10 MED ORDER — MORPHINE SULFATE (PF) 4 MG/ML IV SOLN
4.0000 mg | Freq: Once | INTRAVENOUS | Status: AC
  Administered 2020-12-10: 4 mg via INTRAVENOUS
  Filled 2020-12-10: qty 1

## 2020-12-10 NOTE — ED Triage Notes (Signed)
Pt BIB EMS from [employer]. She and her daughter were exiting grocery store and pt lost her footing on curb, falling onto R side. Abrasions to R side on knee.   C/o R knee, hip, and femur pain, radiating to lower back. No LOC.  Pedal pulses palpable by EMS en route.  Hx diabetes  EMS: CBG 248 BP 158/70 HR 102 RR 18 98% RA  241mcg fentanyl given for 10/10 pain  22G IV lower L forearm

## 2020-12-10 NOTE — ED Provider Notes (Signed)
[facility] EMERGENCY DEPARTMENT Provider Note   CSN: [id number] Arrival date & time: 12/10/20  2229   History No chief complaint on file.   Paula Steele is a 75 y.o. female.  The history is provided by the patient and a relative.  She has history of hypertension, diabetes, hyperlipidemia and comes in by ambulance after suffering a fall.  She was leaving a grocery store and apparently tripped going down a curb and landed on her legs and outstretched arms.  She denies head or neck injury.  She is not on any anticoagulants.  She is complaining of severe pain in her right upper leg and states it hurts to move it.  Past Medical History:  Diagnosis Date  . Arthritis     SHOULDERS--   IS CURRENTLY HAS LEFT SHOULDER PAIN  . Benign neoplasm of adrenal gland   . Benign neoplasm of colon   . Blood transfusion   . Bronchitis    10/2012- followed by ER and then PCP- Dr Melina Copa   . Chronic kidney disease    bladder incontinence   . Chronic meniscal tear of knee, right   . Colon polyp   . Degeneration of intervertebral disc, site unspecified    LUMBAR  . Diabetes mellitus without mention of complication    INSULIN AND ORAL MEDS  . Diverticulosis of colon (without mention of hemorrhage)   . Esophageal reflux    CONTROLLED W/ PRILOSEC  . History of neck pain    chronic left sided neck pain  . Hx: UTI (urinary tract infection)   . Hyperlipidemia   . Hypertension   . Insomnia DUE TO STRESS  . Left arm pain    and  heaviness since radical neck surg  . Neck pain, chronic S/P RADIAL NECK DISSECTION--  2007   LEFT SIDE OF NECK AND SHOULDER PAIN  . Oropharyngeal dysphagia MILD RIGHT SUPRAGLOTTIC REGION   PT STATES SHE EATS VERY SLOWLY AND CHEWS FOOD WELL  . Other chronic nonalcoholic liver disease    SEES DR Deatra Ina-- ?SCARRING OR CIRRHOSIS PER PET SCAN 2010  . Personal history of other diseases of digestive system    OCCASIONAL ABD. PAIN  . 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 Shortness of breath   . Shortness of breath on exertion   . 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 Urgency incontinence   . Vitamin D deficiency   . Weakness of both legs     Patient Active Problem List   Diagnosis Date Noted  . Hypertension   . Shortness of breath 12/30/2013  . Hyperlipidemia 12/30/2013  . Acute encephalopathy 12/07/2012  . CVA (cerebral infarction) 12/07/2012  . OA (osteoarthritis) of knee 11/30/2012  . Acute medial meniscal tear 03/25/2012  . Personal history of colonic polyps 08/15/2011  . Abdominal pain, left lower quadrant 08/15/2011  . Cirrhosis of liver without mention of alcohol 08/15/2011  . Obesity 08/15/2011  . 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 DEPRESSION 04/05/2008  . CAD 04/05/2008  . GERD 04/05/2008  . GASTROPARESIS 04/05/2008  . OTHER CHRONIC NONALCOHOLIC LIVER DISEASE 78/29/5621  . [place] DISEASE 04/05/2008  . BENIGN NEOPLASM OF ADRENAL GLAND 03/31/2007  . DIVERTICULOSIS OF COLON 09/06/2004  . COLONIC POLYPS 02/07/2003    Past Surgical History:  Procedure Laterality Date  .  ABDOMINAL HYSTERECTOMY  1984   W/  LSO  . CATARACT EXTRACTION W/ INTRAOCULAR LENS  IMPLANT, BILATERAL    . CHOLECYSTECTOMY  15 YRS AGO  . DIRECT LARYNGOSCOPY  2007   W/ LEFT NECK MASS EXCISION  . EXCISION LEFT SEBACOUS CYST  05-14-2011 W/  LOCAL  . KNEE ARTHROSCOPY  09/26/2011   Procedure: ARTHROSCOPY KNEE;  Surgeon: Loanne Drilling;  Location: [facility];  Service: Orthopedics;  Laterality: Right;  right knee arthroscopy with meniscal tear  . KNEE ARTHROSCOPY  03/25/2012   Procedure: ARTHROSCOPY KNEE;  Surgeon: Loanne Drilling, MD;  Location: [facility];  Service: Orthopedics;  Laterality: Right;  With Debridement of medial lateral meniscus  . LAPAROSCOPIC SALPINGOOPHERECTOMY  2001   RIGHT  W/  LYSIS AHESIONS  . NECK MASS EXCISION  2007   LEFT  . RADICAL NECK DISSECTION  2007   W/ LEFT TONSILLECTOMY AND LEFT EXCISION LEFT TONGUE BASE DUE TO CANCER  . SHOULDER SURGERY  1990   LEFT  . TONSILLECTOMY  01-31-2009   RIGHT (DUE TO MASS)  . TONSILLECTOMY  2007   LEFT W/ NECK DISSECTION DUE TO CANCER  . TOTAL KNEE ARTHROPLASTY Right 11/30/2012   Procedure: TOTAL KNEE ARTHROPLASTY;  Surgeon: Loanne Drilling, MD;  Location: [facility];  Service: Orthopedics;  Laterality: Right;     OB History   No obstetric history on file.     Family History  Problem Relation Age of Onset  . Thyroid cancer Brother   . Diabetes Mother   . Diabetes Brother   . Heart disease Father   . Heart disease Brother     Social History   Tobacco Use  . Smoking status: Former Smoker    Years: 15.00    Types: Cigarettes    Quit date: 09/22/1984    Years since quitting: 36.2  . Smokeless tobacco: Never Used  Substance Use Topics  . Alcohol use: No  . Drug use: No    Home Medications Prior to Admission medications   Medication Sig Start Date End Date Taking? Authorizing Provider  albuterol (VENTOLIN HFA) 108 (90 Base) MCG/ACT inhaler Inhale 2 puffs into the lungs every 6 (six) hours as needed for wheezing or shortness of breath.    [provider]  cyclobenzaprine (FLEXERIL) 10 MG tablet Take 10 mg by mouth 3 (three) times daily as needed for muscle spasms.    [provider]  diazepam (VALIUM) 10 MG tablet Take 0.5 tablets (5 mg total) by mouth every 12 (twelve) hours as needed for anxiety. Muscle spasms Patient taking differently: Take 5 mg by mouth every 8 (eight) hours as needed for anxiety. Muscle spasms 12/08/12   Dhungel, Theda Belfast, MD  fluticasone-salmeterol (ADVAIR HFA) 115-21 MCG/ACT inhaler Inhale 2 puffs into the lungs as needed.    [provider]  glucose blood (ACCU-CHEK GUIDE) test strip Use as instructed 02/21/17   Roma Kayser, MD  insulin glargine  (LANTUS) 100 UNIT/ML injection Inject 60 Units into the skin at bedtime.    [provider]  Iron-Vitamins (GERITOL COMPLETE PO) Take 1 tablet by mouth daily.    [provider]  metoprolol tartrate (LOPRESSOR) 100 MG tablet Take 1 tablet (100 mg total) by mouth once for 1 dose. 06/17/19 06/17/19  Laqueta Linden, MD  montelukast (SINGULAIR) 10 MG tablet Take 10 mg by mouth at bedtime.    [provider]  omeprazole (PRILOSEC) 10 MG capsule Take 10 mg by mouth  daily.    [provider]  oxyCODONE-acetaminophen (PERCOCET) 10-325 MG tablet Take 1 tablet by mouth every 8 (eight) hours as needed. 05/19/19   [provider]  promethazine (PHENERGAN) 25 MG tablet Take 25 mg by mouth every 6 (six) hours as needed for nausea or vomiting.    [provider]  TRESIBA FLEXTOUCH 100 UNIT/ML SOPN FlexTouch Pen Inject 36 Units into the skin daily. 04/28/19   [provider]  Vilazodone HCl (VIIBRYD STARTER PACK) 10 & 20 MG KIT Take 1 tablet by mouth as directed.    [provider]  Vitamin D, Ergocalciferol, (DRISDOL) 50000 UNITS CAPS Take 50,000 Units by mouth every Sunday.     [provider]    Allergies    Actos [pioglitazone], Bydureon [exenatide], and Metformin and related  Review of Systems   Review of Systems  All other systems reviewed and are negative.   Physical Exam Updated Vital Signs BP (!) 157/86 (BP Location: Right Arm)   Pulse (!) 108   Temp 97.9 F (36.6 C) (Oral)   Resp 20   SpO2 93%   Physical Exam Vitals and nursing note reviewed.   75 year old female, resting comfortably and in no acute distress. Vital signs are significant for elevated blood pressure and heart rate. Oxygen saturation is 93%, which is normal. Head is normocephalic and atraumatic. PERRLA, EOMI. Oropharynx is clear. Neck is immobilized in a stiff cervical collar.  There is mild to moderate tenderness in the midline posteriorly in  the mid and lower cervical spine. Back is moderately tender in the lower lumbar area.  There is no CVA tenderness. Lungs are clear without rales, wheezes, or rhonchi. Chest is nontender. Heart has regular rate and rhythm without murmur. Abdomen is soft, flat, nontender without masses or hepatosplenomegaly and peristalsis is normoactive. Extremities: Soft tissue swelling and tenderness present in the distal right thigh.  There is pain on any passive movement of the right leg.  Distal neurovascular exam is intact with strong dorsalis pedis pulse, prompt capillary refill, normal sensation, normal ability to move toes.  Full range of motion of all other joints without pain. Skin is warm and dry without rash. Neurologic: Mental status is normal, cranial nerves are intact, there are no motor or sensory deficits.  ED Results / Procedures / Treatments   Labs (all labs ordered are listed, but only abnormal results are displayed) Labs Reviewed  BASIC METABOLIC PANEL - Abnormal; Notable for the following components:      Result Value   Glucose, Bld 242 (*)    All other components within normal limits  SARS CORONAVIRUS 2 (TAT 6-24 HRS)  CBC WITH DIFFERENTIAL/PLATELET    EKG EKG Interpretation  Date/Time:  Monday December 11 2020 02:28:16 EST Ventricular Rate:  99 PR Interval:    QRS Duration: 77 QT Interval:  368 QTC Calculation: 473 R Axis:   12 Text Interpretation: Sinus rhythm Low voltage QRS When compared with ECG of 04/19/2020, No significant change was found Confirmed by Delora Fuel ([id number]) on 12/11/2020 2:36:56 AM   Radiology DG Lumbar Spine Complete  Result Date: 12/11/2020 CLINICAL DATA:  Fall with back pain. EXAM: LUMBAR SPINE - COMPLETE 4+ VIEW COMPARISON:  10/08/2017 FINDINGS: No acute lumbar region finding. Five lumbar type vertebral bodies in normal alignment. Chronic disc space narrowing L2-3 through L4-5. Lower lumbar facet osteoarthritis. Cannot rule out a minimal superior  endplate fracture at D14, but this area was not well evaluated on the  lumbar study. Is the pain higher, in the lower thoracic region? IMPRESSION: 1. No acute lumbar region finding. Chronic disc space narrowing L2-3 through L4-5. Lower lumbar facet osteoarthritis. 2. Cannot rule out a minimal T11 superior endplate fracture. Is the patient's pain in the lower thoracic region? Electronically Signed   By: Nelson Chimes M.D.   On: 12/11/2020 00:28   CT Head Wo Contrast  Result Date: 12/10/2020 CLINICAL DATA:  Trauma to the head and neck. EXAM: CT HEAD WITHOUT CONTRAST CT CERVICAL SPINE WITHOUT CONTRAST TECHNIQUE: Multidetector CT imaging of the head and cervical spine was performed following the standard protocol without intravenous contrast. Multiplanar CT image reconstructions of the cervical spine were also generated. COMPARISON:  12/06/2012.  03/16/2020. FINDINGS: CT HEAD FINDINGS Brain: Age related atrophy. Ordinary chronic small-vessel ischemic changes of the hemispheric white matter. No sign of acute infarction, mass lesion, hemorrhage, hydrocephalus or extra-axial collection. Vascular: There is atherosclerotic calcification of the major vessels at the base of the brain. Skull: Negative Sinuses/Orbits: Clear/normal Other: None CT CERVICAL SPINE FINDINGS Alignment: Normal Skull base and vertebrae: No fracture or primary bone lesion. Soft tissues and spinal canal: No significant soft tissue finding. Disc levels: Minimal mid cervical spondylosis. No significant narrowing of the canal or foramina identified. Mild facet osteoarthritis on the right at C2-3 and C3-4. Upper chest: Negative Other: None IMPRESSION: HEAD CT: No acute or traumatic finding. Age related atrophy and chronic small-vessel ischemic changes. CERVICAL SPINE CT: No acute or traumatic finding. Minimal spondylosis and facet osteoarthritis. Electronically Signed   By: Nelson Chimes M.D.   On: 12/10/2020 23:53   CT Cervical Spine Wo Contrast  Result  Date: 12/10/2020 CLINICAL DATA:  Trauma to the head and neck. EXAM: CT HEAD WITHOUT CONTRAST CT CERVICAL SPINE WITHOUT CONTRAST TECHNIQUE: Multidetector CT imaging of the head and cervical spine was performed following the standard protocol without intravenous contrast. Multiplanar CT image reconstructions of the cervical spine were also generated. COMPARISON:  12/06/2012.  03/16/2020. FINDINGS: CT HEAD FINDINGS Brain: Age related atrophy. Ordinary chronic small-vessel ischemic changes of the hemispheric white matter. No sign of acute infarction, mass lesion, hemorrhage, hydrocephalus or extra-axial collection. Vascular: There is atherosclerotic calcification of the major vessels at the base of the brain. Skull: Negative Sinuses/Orbits: Clear/normal Other: None CT CERVICAL SPINE FINDINGS Alignment: Normal Skull base and vertebrae: No fracture or primary bone lesion. Soft tissues and spinal canal: No significant soft tissue finding. Disc levels: Minimal mid cervical spondylosis. No significant narrowing of the canal or foramina identified. Mild facet osteoarthritis on the right at C2-3 and C3-4. Upper chest: Negative Other: None IMPRESSION: HEAD CT: No acute or traumatic finding. Age related atrophy and chronic small-vessel ischemic changes. CERVICAL SPINE CT: No acute or traumatic finding. Minimal spondylosis and facet osteoarthritis. Electronically Signed   By: Nelson Chimes M.D.   On: 12/10/2020 23:53   DG Chest Port 1 View  Result Date: 12/11/2020 CLINICAL DATA:  Preoperative respiratory exam. EXAM: PORTABLE CHEST 1 VIEW COMPARISON:  April 19, 2020 FINDINGS: The heart size and mediastinal contours are within normal limits. Both lungs are clear. The visualized skeletal structures are unremarkable. IMPRESSION: No active cardiopulmonary disease. Electronically Signed   By: Virgina Norfolk M.D.   On: 12/11/2020 02:32   DG Femur Min 2 Views Right  Result Date: 12/11/2020 CLINICAL DATA:  Fall with pain EXAM:  RIGHT FEMUR 2 VIEWS COMPARISON:  04/19/2020 FINDINGS: Right hip and proximal femur appear unremarkable. Acute comminuted transverse fracture the distal  femoral diaphysis just proximal to the distal metaphysis. Lateral displacement of the main distal fracture fragment, by half of the with of the bone. Fractures do not extend to the prosthetic femoral component itself. IMPRESSION: Acute comminuted transverse fracture of the distal femoral diaphysis just proximal to the distal metaphysis. Electronically Signed   By: Nelson Chimes M.D.   On: 12/11/2020 00:26    Procedures Procedures   Medications Ordered in ED Medications  morphine 4 MG/ML injection 4 mg (has no administration in time range)  ondansetron (ZOFRAN) injection 4 mg (has no administration in time range)    ED Course  I have reviewed the triage vital signs and the nursing notes.  Pertinent labs & imaging results that were available during my care of the patient were reviewed by me and considered in my medical decision making (see chart for details).  MDM Rules/Calculators/A&P Fall with right upper leg injury.  Also pain in lower lumbar area and mid cervical area.  She will be sent for x-rays of right femur, lumbar spine and CT scans of head and cervical spine.  Old records are reviewed, and she has no relevant past visits.  Femur x-ray shows fracture just proximal to the femoral condyles, status post total knee arthroplasty.  Remainder of x-rays showed no acute injury.  She is placed in knee immobilizer and consultation was obtained with Dr. Ihor Gully, on-call for orthopedics who has requested patient be admitted to the hospitalists service.  ECG was obtained in anticipation of needing surgery and shows no acute changes.  Case is discussed with Dr. Alcario Drought of [facility], who agrees to admit the patient.  Final Clinical Impression(s) / ED Diagnoses Final diagnoses:  Closed fracture of distal end of right femur, initial encounter [facility])     Rx / [place] Orders ED Discharge Orders    None       Delora Fuel, MD 38/38/18 [phone]

## 2020-12-10 NOTE — ED Notes (Signed)
Patient transported to CT 

## 2020-12-11 ENCOUNTER — Emergency Department (HOSPITAL_COMMUNITY): Payer: Medicare Other

## 2020-12-11 DIAGNOSIS — Z8719 Personal history of other diseases of the digestive system: Secondary | ICD-10-CM | POA: Diagnosis not present

## 2020-12-11 DIAGNOSIS — N189 Chronic kidney disease, unspecified: Secondary | ICD-10-CM | POA: Diagnosis present

## 2020-12-11 DIAGNOSIS — M9711XA Periprosthetic fracture around internal prosthetic right knee joint, initial encounter: Secondary | ICD-10-CM | POA: Diagnosis present

## 2020-12-11 DIAGNOSIS — I10 Essential (primary) hypertension: Secondary | ICD-10-CM | POA: Diagnosis not present

## 2020-12-11 DIAGNOSIS — R338 Other retention of urine: Secondary | ICD-10-CM | POA: Diagnosis present

## 2020-12-11 DIAGNOSIS — D3500 Benign neoplasm of unspecified adrenal gland: Secondary | ICD-10-CM | POA: Diagnosis present

## 2020-12-11 DIAGNOSIS — Z86010 Personal history of colonic polyps: Secondary | ICD-10-CM | POA: Diagnosis not present

## 2020-12-11 DIAGNOSIS — K219 Gastro-esophageal reflux disease without esophagitis: Secondary | ICD-10-CM | POA: Diagnosis present

## 2020-12-11 DIAGNOSIS — K7460 Unspecified cirrhosis of liver: Secondary | ICD-10-CM | POA: Diagnosis present

## 2020-12-11 DIAGNOSIS — E785 Hyperlipidemia, unspecified: Secondary | ICD-10-CM | POA: Diagnosis present

## 2020-12-11 DIAGNOSIS — Z794 Long term (current) use of insulin: Secondary | ICD-10-CM

## 2020-12-11 DIAGNOSIS — E119 Type 2 diabetes mellitus without complications: Secondary | ICD-10-CM | POA: Diagnosis not present

## 2020-12-11 DIAGNOSIS — I129 Hypertensive chronic kidney disease with stage 1 through stage 4 chronic kidney disease, or unspecified chronic kidney disease: Secondary | ICD-10-CM | POA: Diagnosis present

## 2020-12-11 DIAGNOSIS — K5730 Diverticulosis of large intestine without perforation or abscess without bleeding: Secondary | ICD-10-CM | POA: Diagnosis present

## 2020-12-11 DIAGNOSIS — Z9842 Cataract extraction status, left eye: Secondary | ICD-10-CM | POA: Diagnosis not present

## 2020-12-11 DIAGNOSIS — D62 Acute posthemorrhagic anemia: Secondary | ICD-10-CM | POA: Diagnosis not present

## 2020-12-11 DIAGNOSIS — Z87891 Personal history of nicotine dependence: Secondary | ICD-10-CM | POA: Diagnosis not present

## 2020-12-11 DIAGNOSIS — E1165 Type 2 diabetes mellitus with hyperglycemia: Secondary | ICD-10-CM | POA: Diagnosis present

## 2020-12-11 DIAGNOSIS — E559 Vitamin D deficiency, unspecified: Secondary | ICD-10-CM | POA: Diagnosis present

## 2020-12-11 DIAGNOSIS — K7581 Nonalcoholic steatohepatitis (NASH): Secondary | ICD-10-CM | POA: Diagnosis present

## 2020-12-11 DIAGNOSIS — M79604 Pain in right leg: Secondary | ICD-10-CM | POA: Diagnosis present

## 2020-12-11 DIAGNOSIS — Z20822 Contact with and (suspected) exposure to covid-19: Secondary | ICD-10-CM | POA: Diagnosis present

## 2020-12-11 DIAGNOSIS — E11319 Type 2 diabetes mellitus with unspecified diabetic retinopathy without macular edema: Secondary | ICD-10-CM | POA: Diagnosis present

## 2020-12-11 DIAGNOSIS — Z90710 Acquired absence of both cervix and uterus: Secondary | ICD-10-CM | POA: Diagnosis not present

## 2020-12-11 DIAGNOSIS — Z961 Presence of intraocular lens: Secondary | ICD-10-CM | POA: Diagnosis present

## 2020-12-11 DIAGNOSIS — W010XXA Fall on same level from slipping, tripping and stumbling without subsequent striking against object, initial encounter: Secondary | ICD-10-CM | POA: Diagnosis present

## 2020-12-11 DIAGNOSIS — Z9841 Cataract extraction status, right eye: Secondary | ICD-10-CM | POA: Diagnosis not present

## 2020-12-11 DIAGNOSIS — S72401A Unspecified fracture of lower end of right femur, initial encounter for closed fracture: Principal | ICD-10-CM | POA: Diagnosis present

## 2020-12-11 DIAGNOSIS — E1122 Type 2 diabetes mellitus with diabetic chronic kidney disease: Secondary | ICD-10-CM | POA: Diagnosis present

## 2020-12-11 LAB — HEPATIC FUNCTION PANEL
ALT: 33 U/L (ref 0–44)
AST: 36 U/L (ref 15–41)
Albumin: 3.6 g/dL (ref 3.5–5.0)
Alkaline Phosphatase: 125 U/L (ref 38–126)
Bilirubin, Direct: 0.2 mg/dL (ref 0.0–0.2)
Indirect Bilirubin: 0.5 mg/dL (ref 0.3–0.9)
Total Bilirubin: 0.7 mg/dL (ref 0.3–1.2)
Total Protein: 7.9 g/dL (ref 6.5–8.1)

## 2020-12-11 LAB — BASIC METABOLIC PANEL
Anion gap: 9 (ref 5–15)
BUN: 16 mg/dL (ref 8–23)
CO2: 26 mmol/L (ref 22–32)
Calcium: 9.4 mg/dL (ref 8.9–10.3)
Chloride: 103 mmol/L (ref 98–111)
Creatinine, Ser: 0.78 mg/dL (ref 0.44–1.00)
GFR, Estimated: >60 mL/min (ref >60)
Glucose, Bld: 242 mg/dL — ABNORMAL HIGH (ref 70–99)
Potassium: 3.7 mmol/L (ref 3.5–5.1)
Sodium: 138 mmol/L (ref 135–145)

## 2020-12-11 LAB — CBC WITH DIFFERENTIAL/PLATELET
Abs Immature Granulocytes: 0.02 10*3/uL (ref 0.00–0.07)
Basophils Absolute: 0 10*3/uL (ref 0.0–0.1)
Basophils Relative: 1 %
Eosinophils Absolute: 0 10*3/uL (ref 0.0–0.5)
Eosinophils Relative: 1 %
HCT: 37.7 % (ref 36.0–46.0)
Hemoglobin: 12.9 g/dL (ref 12.0–15.0)
Immature Granulocytes: 0 %
Lymphocytes Relative: 15 %
Lymphs Abs: 1 10*3/uL (ref 0.7–4.0)
MCH: 29.6 pg (ref 26.0–34.0)
MCHC: 34.2 g/dL (ref 30.0–36.0)
MCV: 86.5 fL (ref 80.0–100.0)
Monocytes Absolute: 0.2 10*3/uL (ref 0.1–1.0)
Monocytes Relative: 3 %
Neutro Abs: 5.3 10*3/uL (ref 1.7–7.7)
Neutrophils Relative %: 80 %
Platelets: 154 10*3/uL (ref 150–400)
RBC: 4.36 MIL/uL (ref 3.87–5.11)
RDW: 14.4 % (ref 11.5–15.5)
WBC: 6.6 10*3/uL (ref 4.0–10.5)
nRBC: 0 % (ref 0.0–0.2)

## 2020-12-11 LAB — URINALYSIS, ROUTINE W REFLEX MICROSCOPIC
Bacteria, UA: NONE SEEN
Bilirubin Urine: NEGATIVE
Glucose, UA: >=500 mg/dL — AB
Hgb urine dipstick: NEGATIVE
Ketones, ur: NEGATIVE mg/dL
Leukocytes,Ua: NEGATIVE
Nitrite: NEGATIVE
Protein, ur: NEGATIVE mg/dL
Specific Gravity, Urine: 1.027 (ref 1.005–1.030)
pH: 6 (ref 5.0–8.0)

## 2020-12-11 LAB — SURGICAL PCR SCREEN
MRSA, PCR: NEGATIVE
Staphylococcus aureus: NEGATIVE

## 2020-12-11 LAB — GLUCOSE, CAPILLARY: Glucose-Capillary: 99 mg/dL (ref 70–99)

## 2020-12-11 LAB — SARS CORONAVIRUS 2 (TAT 6-24 HRS): SARS Coronavirus 2: NEGATIVE

## 2020-12-11 LAB — CBG MONITORING, ED
Glucose-Capillary: 181 mg/dL — ABNORMAL HIGH (ref 70–99)
Glucose-Capillary: 169 mg/dL — ABNORMAL HIGH (ref 70–99)
Glucose-Capillary: 114 mg/dL — ABNORMAL HIGH (ref 70–99)
Glucose-Capillary: 110 mg/dL — ABNORMAL HIGH (ref 70–99)

## 2020-12-11 LAB — HEMOGLOBIN A1C
Hgb A1c MFr Bld: 8.6 % — ABNORMAL HIGH (ref 4.8–5.6)
Mean Plasma Glucose: 200.12 mg/dL

## 2020-12-11 MED ORDER — PANTOPRAZOLE SODIUM 40 MG PO TBEC
40.0000 mg | DELAYED_RELEASE_TABLET | Freq: Every day | ORAL | Status: DC
  Administered 2020-12-11 – 2020-12-18 (×7): 40 mg via ORAL
  Filled 2020-12-11 (×7): qty 1

## 2020-12-11 MED ORDER — DIAZEPAM 5 MG PO TABS
5.0000 mg | ORAL_TABLET | Freq: Three times a day (TID) | ORAL | Status: DC | PRN
  Administered 2020-12-13: 5 mg via ORAL

## 2020-12-11 MED ORDER — HYDROMORPHONE HCL 1 MG/ML IJ SOLN
0.5000 mg | Freq: Once | INTRAMUSCULAR | Status: AC
  Administered 2020-12-11: 0.5 mg via INTRAVENOUS
  Filled 2020-12-11: qty 1

## 2020-12-11 MED ORDER — KETOROLAC TROMETHAMINE 15 MG/ML IJ SOLN
15.0000 mg | Freq: Four times a day (QID) | INTRAMUSCULAR | Status: DC | PRN
  Administered 2020-12-11 – 2020-12-14 (×4): 15 mg via INTRAVENOUS
  Filled 2020-12-11 (×4): qty 1

## 2020-12-11 MED ORDER — MOMETASONE FURO-FORMOTEROL FUM 200-5 MCG/ACT IN AERO
2.0000 | INHALATION_SPRAY | Freq: Two times a day (BID) | RESPIRATORY_TRACT | Status: DC
  Administered 2020-12-11 – 2020-12-18 (×13): 2 via RESPIRATORY_TRACT
  Filled 2020-12-11: qty 8.8

## 2020-12-11 MED ORDER — ALBUTEROL SULFATE HFA 108 (90 BASE) MCG/ACT IN AERS: 2.0000 | INHALATION_SPRAY | Freq: Four times a day (QID) | RESPIRATORY_TRACT | Status: DC | PRN

## 2020-12-11 MED ORDER — MONTELUKAST SODIUM 10 MG PO TABS
10.0000 mg | ORAL_TABLET | Freq: Every day | ORAL | Status: DC
  Administered 2020-12-11 – 2020-12-17 (×7): 10 mg via ORAL
  Filled 2020-12-11 (×8): qty 1

## 2020-12-11 MED ORDER — INSULIN ASPART 100 UNIT/ML ~~LOC~~ SOLN
0.0000 [IU] | SUBCUTANEOUS | Status: DC
  Administered 2020-12-11: 08:00:00 2 [IU] via SUBCUTANEOUS
  Administered 2020-12-11: 5 [IU] via SUBCUTANEOUS
  Administered 2020-12-12 (×2): 3 [IU] via SUBCUTANEOUS
  Administered 2020-12-12: 18:00:00 5 [IU] via SUBCUTANEOUS
  Administered 2020-12-12 (×2): 3 [IU] via SUBCUTANEOUS
  Administered 2020-12-12: 13:00:00 2 [IU] via SUBCUTANEOUS
  Administered 2020-12-13: 9 [IU] via SUBCUTANEOUS
  Administered 2020-12-13: 3 [IU] via SUBCUTANEOUS
  Administered 2020-12-13: 2 [IU] via SUBCUTANEOUS
  Administered 2020-12-13: 9 [IU] via SUBCUTANEOUS
  Administered 2020-12-13 – 2020-12-14 (×2): 3 [IU] via SUBCUTANEOUS
  Administered 2020-12-14 (×2): 5 [IU] via SUBCUTANEOUS
  Administered 2020-12-14: 2 [IU] via SUBCUTANEOUS
  Administered 2020-12-14: 3 [IU] via SUBCUTANEOUS
  Administered 2020-12-14: 2 [IU] via SUBCUTANEOUS
  Administered 2020-12-14: 3 [IU] via SUBCUTANEOUS
  Administered 2020-12-15: 5 [IU] via SUBCUTANEOUS
  Administered 2020-12-15 (×3): 3 [IU] via SUBCUTANEOUS
  Administered 2020-12-15: 7 [IU] via SUBCUTANEOUS
  Administered 2020-12-16: 2 [IU] via SUBCUTANEOUS
  Administered 2020-12-16: 5 [IU] via SUBCUTANEOUS
  Administered 2020-12-16: 3 [IU] via SUBCUTANEOUS
  Administered 2020-12-16 (×2): 5 [IU] via SUBCUTANEOUS
  Administered 2020-12-16 – 2020-12-17 (×4): 3 [IU] via SUBCUTANEOUS
  Administered 2020-12-17: 5 [IU] via SUBCUTANEOUS
  Administered 2020-12-17 (×2): 7 [IU] via SUBCUTANEOUS
  Administered 2020-12-18 (×3): 3 [IU] via SUBCUTANEOUS

## 2020-12-11 MED ORDER — CHLORHEXIDINE GLUCONATE CLOTH 2 % EX PADS
6.0000 | MEDICATED_PAD | Freq: Every day | CUTANEOUS | Status: DC
  Administered 2020-12-12 – 2020-12-17 (×4): 6 via TOPICAL

## 2020-12-11 MED ORDER — INSULIN GLARGINE 100 UNIT/ML ~~LOC~~ SOLN
21.0000 [IU] | Freq: Every day | SUBCUTANEOUS | Status: DC
  Administered 2020-12-11 – 2020-12-14 (×4): 21 [IU] via SUBCUTANEOUS
  Filled 2020-12-11 (×5): qty 0.21

## 2020-12-11 MED ORDER — HYDROMORPHONE HCL 1 MG/ML IJ SOLN
0.5000 mg | INTRAMUSCULAR | Status: DC | PRN
  Administered 2020-12-11 – 2020-12-13 (×11): 1 mg via INTRAVENOUS
  Filled 2020-12-11 (×11): qty 1

## 2020-12-11 MED ORDER — HYDROMORPHONE HCL 1 MG/ML IJ SOLN
1.0000 mg | Freq: Once | INTRAMUSCULAR | Status: AC
  Administered 2020-12-11: 1 mg via INTRAVENOUS
  Filled 2020-12-11: qty 1

## 2020-12-11 MED ORDER — FLUTICASONE PROPIONATE 50 MCG/ACT NA SUSP
1.0000 | Freq: Every day | NASAL | Status: DC
  Administered 2020-12-14 – 2020-12-17 (×3): 1 via NASAL
  Filled 2020-12-11 (×2): qty 16

## 2020-12-11 MED ORDER — VORTIOXETINE HBR 5 MG PO TABS
10.0000 mg | ORAL_TABLET | Freq: Every day | ORAL | Status: DC
  Administered 2020-12-11 – 2020-12-18 (×7): 10 mg via ORAL
  Filled 2020-12-11 (×9): qty 2

## 2020-12-11 MED ORDER — PREGABALIN 25 MG PO CAPS
50.0000 mg | ORAL_CAPSULE | Freq: Two times a day (BID) | ORAL | Status: DC
  Administered 2020-12-11 – 2020-12-14 (×6): 50 mg via ORAL
  Filled 2020-12-11 (×6): qty 2

## 2020-12-11 NOTE — Consult Note (Signed)
Reason for Consult:  Right distal femur periprosthetic fracture Referring Physician: ED physician  Paula Steele is an 75 y.o. female.  HPI: Paula Steele is a 75 y.o. female who presented to the ED after mechanical fall while leaving a store.  Tripped going down a curb and landed on legs and outstretched arms.  No head nor neck injury, no LOC, not on anticoagulants.  Pt with severe pain in R knee.  Pain worse with movement.  Pain is constant.  Images obtained while in the ER reveal distal R femur fx, complicated by presence of prior R knee joint replacement.  Ortho was consulted.  I have discussed the need for surgical repair of the right distal femur.  The risks, benefits and expectations were discussed with the patient.  Risks including but not limited to the risk of anesthesia, blood clots, nerve damage, blood vessel damage, failure of the prosthesis, infection and up to and including death.  Patient understand the risks, benefits and expectations and wishes to proceed with surgery.   Past Medical History:  Diagnosis Date   Arthritis     SHOULDERS--   IS CURRENTLY HAS LEFT SHOULDER PAIN   Benign neoplasm of adrenal gland    Benign neoplasm of colon    Blood transfusion    Bronchitis    10/2012- followed by ER and then PCP- Dr Melina Copa    Chronic kidney disease    bladder incontinence    Chronic meniscal tear of knee, right    Colon polyp    Degeneration of intervertebral disc, site unspecified    LUMBAR   Diabetes mellitus without mention of complication    INSULIN AND ORAL MEDS   Diverticulosis of colon (without mention of hemorrhage)    Esophageal reflux    CONTROLLED W/ PRILOSEC   History of neck pain    chronic left sided neck pain   Hx: UTI (urinary tract infection)    Hyperlipidemia    Hypertension    Insomnia DUE TO STRESS   Left arm pain    and  heaviness since radical neck surg   Neck pain, chronic S/P RADIAL NECK DISSECTION--  2007   LEFT SIDE OF  NECK AND SHOULDER PAIN   Oropharyngeal dysphagia MILD RIGHT SUPRAGLOTTIC REGION   PT STATES SHE EATS VERY SLOWLY AND CHEWS FOOD WELL   Other chronic nonalcoholic liver disease    SEES DR Deatra Ina-- ?SCARRING OR CIRRHOSIS PER PET SCAN 2010   Personal history of other diseases of digestive system    OCCASIONAL ABD. 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  (NO RECURRENCE PER PET SCAN  07-03-09)   S/P EXCISION  AND RADICAL NECK DISSECTION 2007   Urgency incontinence    Vitamin D deficiency    Weakness of both legs     Past Surgical History:  Procedure Laterality Date   ABDOMINAL HYSTERECTOMY  1984   W/  LSO   CATARACT EXTRACTION W/ INTRAOCULAR LENS  IMPLANT, BILATERAL     CHOLECYSTECTOMY  15 YRS AGO   DIRECT LARYNGOSCOPY  2007   W/ LEFT NECK MASS EXCISION   EXCISION LEFT SEBACOUS CYST  05-14-2011 W/  LOCAL   KNEE ARTHROSCOPY  09/26/2011   Procedure: ARTHROSCOPY KNEE;  Surgeon: Dione Plover Aluisio;  Location: [facility];  Service: Orthopedics;  Laterality: Right;  right knee arthroscopy with meniscal tear   KNEE ARTHROSCOPY  03/25/2012   Procedure: ARTHROSCOPY KNEE;  Surgeon: Gearlean Alf, MD;  Location: [facility];  Service: Orthopedics;  Laterality: Right;  With Debridement of medial lateral meniscus   LAPAROSCOPIC SALPINGOOPHERECTOMY  2001   RIGHT  W/ LYSIS AHESIONS   NECK MASS EXCISION  2007   LEFT   RADICAL NECK DISSECTION  2007   W/ LEFT TONSILLECTOMY AND LEFT EXCISION LEFT TONGUE BASE DUE TO CANCER   SHOULDER SURGERY  1990   LEFT   TONSILLECTOMY  01-31-2009   RIGHT (DUE TO MASS)   TONSILLECTOMY  2007   LEFT W/ NECK DISSECTION DUE TO CANCER   TOTAL KNEE ARTHROPLASTY Right 11/30/2012   Procedure: TOTAL KNEE ARTHROPLASTY;  Surgeon: Gearlean Alf, MD;  Location: [facility];  Service: Orthopedics;  Laterality:  Right;    Family History  Problem Relation Age of Onset   Thyroid cancer Brother    Diabetes Mother    Diabetes Brother    Heart disease Father    Heart disease Brother     Social History:  reports that she quit smoking about 36 years ago. Her smoking use included cigarettes. She quit after 15.00 years of use. She has never used smokeless tobacco. She reports that she does not drink alcohol and does not use drugs.  Allergies:  Allergies  Allergen Reactions   Actos [Pioglitazone]     edema   Bydureon [Exenatide]     nausea   Metformin And Related     diahrrhea     Results for orders placed or performed during the hospital encounter of 12/10/20 (from the past 48 hour(s))  Basic metabolic panel     Status: Abnormal   Collection Time: 12/11/20 12:20 AM  Result Value Ref Range   Sodium 138 135 - 145 mmol/L   Potassium 3.7 3.5 - 5.1 mmol/L   Chloride 103 98 - 111 mmol/L   CO2 26 22 - 32 mmol/L   Glucose, Bld 242 (H) 70 - 99 mg/dL    Comment: Glucose reference range applies only to samples taken after fasting for at least 8 hours.   BUN 16 8 - 23 mg/dL   Creatinine, Ser 0.78 0.44 - 1.00 mg/dL   Calcium 9.4 8.9 - 10.3 mg/dL   GFR, Estimated >60 >60 mL/min    Comment: (NOTE) Calculated using the CKD-EPI Creatinine Equation (2021)    Anion gap 9 5 - 15    Comment: Performed at [facility] [address], [place], [place] [address]  CBC with Differential     Status: None   Collection Time: 12/11/20 12:20 AM  Result Value Ref Range   WBC 6.6 4.0 - 10.5 K/uL   RBC 4.36 3.87 - 5.11 MIL/uL   Hemoglobin 12.9 12.0 - 15.0 g/dL   HCT 37.7 36.0 - 46.0 %   MCV 86.5 80.0 - 100.0 fL   MCH 29.6 26.0 - 34.0 pg   MCHC 34.2 30.0 - 36.0 g/dL   RDW 14.4 11.5 - 15.5 %   Platelets 154 150 - 400 K/uL   nRBC 0.0 0.0 - 0.2 %   Neutrophils Relative % 80 %   Neutro Abs 5.3 1.7 - 7.7 K/uL   Lymphocytes Relative 15 %   Lymphs Abs 1.0 0.7 - 4.0 K/uL   Monocytes Relative 3 %    Monocytes Absolute 0.2 0.1 - 1.0 K/uL   Eosinophils Relative 1 %  Eosinophils Absolute 0.0 0.0 - 0.5 K/uL   Basophils Relative 1 %   Basophils Absolute 0.0 0.0 - 0.1 K/uL   Immature Granulocytes 0 %   Abs Immature Granulocytes 0.02 0.00 - 0.07 K/uL    Comment: Performed at [facility], [place] [address] [place], [place] [address]  SARS CORONAVIRUS 2 (TAT 6-24 HRS) Nasopharyngeal Nasopharyngeal Swab     Status: None   Collection Time: 12/11/20  2:08 AM   Specimen: Nasopharyngeal Swab  Result Value Ref Range   SARS Coronavirus 2 NEGATIVE NEGATIVE    Comment: (NOTE) SARS-CoV-2 target nucleic acids are NOT DETECTED.  The SARS-CoV-2 RNA is generally detectable in upper and lower respiratory specimens during the acute phase of infection. Negative results do not preclude SARS-CoV-2 infection, do not rule out co-infections with other pathogens, and should not be used as the sole basis for treatment or other patient management decisions. Negative results must be combined with clinical observations, patient history, and epidemiological information. The expected result is Negative.  Fact Sheet for Patients: SugarRoll.be  Fact Sheet for Healthcare Providers: https://www.woods-mathews.com/  This test is not yet approved or cleared by the [place] FDA and  has been authorized for detection and/or diagnosis of SARS-CoV-2 by FDA under an Emergency Use Authorization (EUA). This EUA will remain  in effect (meaning this test can be used) for the duration of the COVID-19 declaration under Se ction 564(b)(1) of the Act, 21 U.S.C. section 360bbb-3(b)(1), unless the authorization is terminated or revoked sooner.  Performed at [facility], [place] [address] [place], [place] [address]   Hemoglobin A1c     Status: Abnormal   Collection Time: 12/11/20  5:11 AM  Result Value Ref Range   Hgb A1c MFr Bld 8.6 (H) 4.8 - 5.6 %    Comment:  (NOTE) Pre diabetes:          5.7%-6.4%  Diabetes:              >6.4%  Glycemic control for   <7.0% adults with diabetes    Mean Plasma Glucose 200.12 mg/dL    Comment: Performed at [facility] [address] [place], [place] [address]  CBG monitoring, ED     Status: Abnormal   Collection Time: 12/11/20  6:16 AM  Result Value Ref Range   Glucose-Capillary 181 (H) 70 - 99 mg/dL    Comment: Glucose reference range applies only to samples taken after fasting for at least 8 hours.    DG Lumbar Spine Complete  Result Date: 12/11/2020 CLINICAL DATA:  Fall with back pain. EXAM: LUMBAR SPINE - COMPLETE 4+ VIEW COMPARISON:  10/08/2017 FINDINGS: No acute lumbar region finding. Five lumbar type vertebral bodies in normal alignment. Chronic disc space narrowing L2-3 through L4-5. Lower lumbar facet osteoarthritis. Cannot rule out a minimal superior endplate fracture at X44, but this area was not well evaluated on the lumbar study. Is the pain higher, in the lower thoracic region? IMPRESSION: 1. No acute lumbar region finding. Chronic disc space narrowing L2-3 through L4-5. Lower lumbar facet osteoarthritis. 2. Cannot rule out a minimal T11 superior endplate fracture. Is the patient's pain in the lower thoracic region? Electronically Signed   By: Nelson Chimes M.D.   On: 12/11/2020 00:28   CT Head Wo Contrast  Result Date: 12/10/2020 CLINICAL DATA:  Trauma to the head and neck. EXAM: CT HEAD WITHOUT CONTRAST CT CERVICAL SPINE WITHOUT CONTRAST TECHNIQUE: Multidetector CT imaging of the head  and cervical spine was performed following the standard protocol without intravenous contrast. Multiplanar CT image reconstructions of the cervical spine were also generated. COMPARISON:  12/06/2012.  03/16/2020. FINDINGS: CT HEAD FINDINGS Brain: Age related atrophy. Ordinary chronic small-vessel ischemic changes of the hemispheric white matter. No sign of acute infarction, mass lesion, hemorrhage, hydrocephalus  or extra-axial collection. Vascular: There is atherosclerotic calcification of the major vessels at the base of the brain. Skull: Negative Sinuses/Orbits: Clear/normal Other: None CT CERVICAL SPINE FINDINGS Alignment: Normal Skull base and vertebrae: No fracture or primary bone lesion. Soft tissues and spinal canal: No significant soft tissue finding. Disc levels: Minimal mid cervical spondylosis. No significant narrowing of the canal or foramina identified. Mild facet osteoarthritis on the right at C2-3 and C3-4. Upper chest: Negative Other: None IMPRESSION: HEAD CT: No acute or traumatic finding. Age related atrophy and chronic small-vessel ischemic changes. CERVICAL SPINE CT: No acute or traumatic finding. Minimal spondylosis and facet osteoarthritis. Electronically Signed   By: Nelson Chimes M.D.   On: 12/10/2020 23:53   CT Cervical Spine Wo Contrast  Result Date: 12/10/2020 CLINICAL DATA:  Trauma to the head and neck. EXAM: CT HEAD WITHOUT CONTRAST CT CERVICAL SPINE WITHOUT CONTRAST TECHNIQUE: Multidetector CT imaging of the head and cervical spine was performed following the standard protocol without intravenous contrast. Multiplanar CT image reconstructions of the cervical spine were also generated. COMPARISON:  12/06/2012.  03/16/2020. FINDINGS: CT HEAD FINDINGS Brain: Age related atrophy. Ordinary chronic small-vessel ischemic changes of the hemispheric white matter. No sign of acute infarction, mass lesion, hemorrhage, hydrocephalus or extra-axial collection. Vascular: There is atherosclerotic calcification of the major vessels at the base of the brain. Skull: Negative Sinuses/Orbits: Clear/normal Other: None CT CERVICAL SPINE FINDINGS Alignment: Normal Skull base and vertebrae: No fracture or primary bone lesion. Soft tissues and spinal canal: No significant soft tissue finding. Disc levels: Minimal mid cervical spondylosis. No significant narrowing of the canal or foramina identified. Mild facet  osteoarthritis on the right at C2-3 and C3-4. Upper chest: Negative Other: None IMPRESSION: HEAD CT: No acute or traumatic finding. Age related atrophy and chronic small-vessel ischemic changes. CERVICAL SPINE CT: No acute or traumatic finding. Minimal spondylosis and facet osteoarthritis. Electronically Signed   By: Nelson Chimes M.D.   On: 12/10/2020 23:53   DG Chest Port 1 View  Result Date: 12/11/2020 CLINICAL DATA:  Preoperative respiratory exam. EXAM: PORTABLE CHEST 1 VIEW COMPARISON:  April 19, 2020 FINDINGS: The heart size and mediastinal contours are within normal limits. Both lungs are clear. The visualized skeletal structures are unremarkable. IMPRESSION: No active cardiopulmonary disease. Electronically Signed   By: Virgina Norfolk M.D.   On: 12/11/2020 02:32   DG Femur Min 2 Views Right  Result Date: 12/11/2020 CLINICAL DATA:  Fall with pain EXAM: RIGHT FEMUR 2 VIEWS COMPARISON:  04/19/2020 FINDINGS: Right hip and proximal femur appear unremarkable. Acute comminuted transverse fracture the distal femoral diaphysis just proximal to the distal metaphysis. Lateral displacement of the main distal fracture fragment, by half of the with of the bone. Fractures do not extend to the prosthetic femoral component itself. IMPRESSION: Acute comminuted transverse fracture of the distal femoral diaphysis just proximal to the distal metaphysis. Electronically Signed   By: Nelson Chimes M.D.   On: 12/11/2020 00:26    Review of Systems  Constitutional: Negative.   HENT: Negative.   Eyes: Negative.   Respiratory: Negative.   Cardiovascular: Negative.   Gastrointestinal: Negative.   Genitourinary: Negative.  Musculoskeletal: Positive for joint pain.  Skin: Negative.   Neurological: Negative.   Endo/Heme/Allergies: Negative.   Psychiatric/Behavioral: The patient has insomnia.     Blood pressure 121/65, pulse 100, temperature 97.9 F (36.6 C), temperature source Oral, resp. rate 20, SpO2 95  %. Physical Exam Constitutional:      Appearance: She is well-developed.  HENT:     Head: Normocephalic.  Eyes:     Pupils: Pupils are equal, round, and reactive to light.  Neck:     Thyroid: No thyromegaly.     Vascular: No JVD.     Trachea: No tracheal deviation.  Cardiovascular:     Rate and Rhythm: Normal rate and regular rhythm.     Pulses: Intact distal pulses.  Pulmonary:     Effort: Pulmonary effort is normal. No respiratory distress.     Breath sounds: Normal breath sounds. No wheezing.  Abdominal:     Palpations: Abdomen is soft.     Tenderness: There is no abdominal tenderness. There is no guarding.  Musculoskeletal:     Cervical back: Neck supple.     Left upper leg: Swelling, tenderness and bony tenderness present.  Lymphadenopathy:     Cervical: No cervical adenopathy.  Skin:    General: Skin is warm and dry.  Neurological:     Mental Status: She is alert and oriented to person, place, and time.  Psychiatric:        Mood and Affect: Mood and affect normal.     Assessment/Plan: Right distal femur periprosthetic fracture    Surgery will be needed to repair the right distal femur periprosthetic fracture  Dr. Alvan Dame to discuss with partners/ other surgeons to determine who will do the surgery, if not him  Surgery date TBD   Discussed risks, benefits and expectations were discussed with the patient.  Risks including but not limited to the risk of anesthesia, blood clots, nerve damage, blood vessel damage, failure of the prosthesis, infection and up to and including death.  Patient understand the risks, benefits and expectations and wishes to proceed with surgery.    Lucille Passy [facility] 12/11/2020, 7:40 AM

## 2020-12-11 NOTE — Progress Notes (Signed)
Progress Note    Paula Steele  YHC:[id number] DOB: 08-15-1946  DOA: 12/10/2020 PCP: Paula Sill, NP    Brief Narrative:   Chief complaint: Fall.  Medical records reviewed and are as summarized below:  Paula Steele is an 75 y.o. female with a PMH of DM2, HTN, NASH who presented with an acute distal right femur fracture after tripping and falling.  Assessment/Plan:   Principal Problem:   Closed fracture of right distal femur ([place]), POA Films personally reviewed, clean break above right knee, not involving hardware. Orthopedics consulted. Likely for operative repair today or tomorrow, date pending. Continue pain control with PRN dilaudid.   Active Problems:   Urinary retention, acute, POA Foley placed.    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 CBG 169-181. Hgb A1c 8.6%.    NASH (nonalcoholic steatohepatitis), chronic, POA Supportive care. Check LFTs.    Hypertension Does not appear to be on chronic medications for this.  Nutritional status: Obese        There is no height or weight on file to calculate BMI.   Family Communication/Anticipated D/C date and plan/Code Status   DVT prophylaxis: SCDs Start: 12/11/20 0440   Current Level of Care: Med-Surg Code Status: Full Code.  Family Communication: Both daughters updated by telephone. Disposition Plan: Status is: Inpatient  Remains inpatient appropriate because:femur fracture awaiting surgical repair, will need pain management, likely SNF.   Dispo: The patient is from: Home. Lives with daughter.              Anticipated d/c is to: SNF              Anticipated d/c date is: 2 days              Patient currently is not medically stable to d/c.   Difficult to place patient No    Medical Consultants:    Orthopedic  Surgery   Anti-Infectives:    None  Subjective:   Has had excruciating right leg/hip/knee. No current nausea or vomiting, but had nausea yesterday. No chest pain or shortness of breath.   Objective:    Vitals:   12/11/20 0700 12/11/20 0702 12/11/20 0715 12/11/20 0730  BP: 121/65     Pulse: 97 99 99 100  Resp:    20  Temp:      TempSrc:      SpO2: (!) 88% 93% 93% 95%   No intake or output data in the 24 hours ending 12/11/20 0802 There were no vitals filed for this visit.  Exam: General: No acute distress. Obese. Cardiovascular: Heart sounds show a regular rate, and rhythm. No gallops or rubs. No murmurs. No JVD. Lungs: Clear to auscultation bilaterally with decreased air movement. No rales, rhonchi or wheezes. Abdomen: Soft, nontender, nondistended with normal active bowel sounds. No masses. No hepatosplenomegaly. Neurological: Alert and oriented 3. Weak, but moves upper extremities spontaneously, unable to move RLE due to pain. Cranial nerves II through XII grossly intact. Skin: Warm and dry. No rashes or lesions. Extremities: No clubbing or cyanosis. 1+ edema. Pedal pulses 2+. Psychiatric: Mood and affect are normal. Insight and judgment are fair.     Data Reviewed:   I have personally reviewed following labs and imaging studies:  Labs: Labs show the following:   Basic  Metabolic Panel: Recent Labs  Lab 12/11/20 0020  NA 138  K 3.7  CL 103  CO2 26  GLUCOSE 242*  BUN 16  CREATININE 0.78  CALCIUM 9.4   GFR CrCl cannot be calculated (Unknown ideal weight.). Liver Function Tests: No results for input(s): AST, ALT, ALKPHOS, BILITOT, PROT, ALBUMIN in the last 168 hours. No results for input(s): LIPASE, AMYLASE in the last 168 hours. No results for input(s): AMMONIA in the last 168 hours.  CBC: Recent Labs  Lab 12/11/20 0020  WBC 6.6  NEUTROABS 5.3  HGB 12.9  HCT 37.7  MCV 86.5  PLT 154   CBG: Recent Labs  Lab 12/11/20 0616 12/11/20 0751   GLUCAP 181* 169*   Hgb A1c: Recent Labs    12/11/20 0511  HGBA1C 8.6*    Microbiology Recent Results (from the past 240 hour(s))  SARS CORONAVIRUS 2 (TAT 6-24 HRS) Nasopharyngeal Nasopharyngeal Swab     Status: None   Collection Time: 12/11/20  2:08 AM   Specimen: Nasopharyngeal Swab  Result Value Ref Range Status   SARS Coronavirus 2 NEGATIVE NEGATIVE Final    Comment: (NOTE) SARS-CoV-2 target nucleic acids are NOT DETECTED.  The SARS-CoV-2 RNA is generally detectable in upper and lower respiratory specimens during the acute phase of infection. Negative results do not preclude SARS-CoV-2 infection, do not rule out co-infections with other pathogens, and should not be used as the sole basis for treatment or other patient management decisions. Negative results must be combined with clinical observations, patient history, and epidemiological information. The expected result is Negative.  Fact Sheet for Patients: SugarRoll.be  Fact Sheet for Healthcare Providers: https://www.woods-mathews.com/  This test is not yet approved or cleared by the [place] FDA and  has been authorized for detection and/or diagnosis of SARS-CoV-2 by FDA under an Emergency Use Authorization (EUA). This EUA will remain  in effect (meaning this test can be used) for the duration of the COVID-19 declaration under Se ction 564(b)(1) of the Act, 21 U.S.C. section 360bbb-3(b)(1), unless the authorization is terminated or revoked sooner.  Performed at [facility], [place] [address] [place], [place] [address]     Procedures and diagnostic studies:  DG Lumbar Spine Complete  Result Date: 12/11/2020 CLINICAL DATA:  Fall with back pain. EXAM: LUMBAR SPINE - COMPLETE 4+ VIEW COMPARISON:  10/08/2017 FINDINGS: No acute lumbar region finding. Five lumbar type vertebral bodies in normal alignment. Chronic disc space narrowing L2-3 through L4-5. Lower lumbar  facet osteoarthritis. Cannot rule out a minimal superior endplate fracture at M76, but this area was not well evaluated on the lumbar study. Is the pain higher, in the lower thoracic region? IMPRESSION: 1. No acute lumbar region finding. Chronic disc space narrowing L2-3 through L4-5. Lower lumbar facet osteoarthritis. 2. Cannot rule out a minimal T11 superior endplate fracture. Is the patient's pain in the lower thoracic region? Electronically Signed   By: Nelson Chimes M.D.   On: 12/11/2020 00:28   CT Head Wo Contrast  Result Date: 12/10/2020 CLINICAL DATA:  Trauma to the head and neck. EXAM: CT HEAD WITHOUT CONTRAST CT CERVICAL SPINE WITHOUT CONTRAST TECHNIQUE: Multidetector CT imaging of the head and cervical spine was performed following the standard protocol without intravenous contrast. Multiplanar CT image reconstructions of the cervical spine were also generated. COMPARISON:  12/06/2012.  03/16/2020. FINDINGS: CT HEAD FINDINGS Brain: Age related atrophy. Ordinary chronic small-vessel ischemic changes of the hemispheric white matter. No sign of acute infarction, mass lesion,  hemorrhage, hydrocephalus or extra-axial collection. Vascular: There is atherosclerotic calcification of the major vessels at the base of the brain. Skull: Negative Sinuses/Orbits: Clear/normal Other: None CT CERVICAL SPINE FINDINGS Alignment: Normal Skull base and vertebrae: No fracture or primary bone lesion. Soft tissues and spinal canal: No significant soft tissue finding. Disc levels: Minimal mid cervical spondylosis. No significant narrowing of the canal or foramina identified. Mild facet osteoarthritis on the right at C2-3 and C3-4. Upper chest: Negative Other: None IMPRESSION: HEAD CT: No acute or traumatic finding. Age related atrophy and chronic small-vessel ischemic changes. CERVICAL SPINE CT: No acute or traumatic finding. Minimal spondylosis and facet osteoarthritis. Electronically Signed   By: Nelson Chimes M.D.   On:  12/10/2020 23:53   CT Cervical Spine Wo Contrast  Result Date: 12/10/2020 CLINICAL DATA:  Trauma to the head and neck. EXAM: CT HEAD WITHOUT CONTRAST CT CERVICAL SPINE WITHOUT CONTRAST TECHNIQUE: Multidetector CT imaging of the head and cervical spine was performed following the standard protocol without intravenous contrast. Multiplanar CT image reconstructions of the cervical spine were also generated. COMPARISON:  12/06/2012.  03/16/2020. FINDINGS: CT HEAD FINDINGS Brain: Age related atrophy. Ordinary chronic small-vessel ischemic changes of the hemispheric white matter. No sign of acute infarction, mass lesion, hemorrhage, hydrocephalus or extra-axial collection. Vascular: There is atherosclerotic calcification of the major vessels at the base of the brain. Skull: Negative Sinuses/Orbits: Clear/normal Other: None CT CERVICAL SPINE FINDINGS Alignment: Normal Skull base and vertebrae: No fracture or primary bone lesion. Soft tissues and spinal canal: No significant soft tissue finding. Disc levels: Minimal mid cervical spondylosis. No significant narrowing of the canal or foramina identified. Mild facet osteoarthritis on the right at C2-3 and C3-4. Upper chest: Negative Other: None IMPRESSION: HEAD CT: No acute or traumatic finding. Age related atrophy and chronic small-vessel ischemic changes. CERVICAL SPINE CT: No acute or traumatic finding. Minimal spondylosis and facet osteoarthritis. Electronically Signed   By: Nelson Chimes M.D.   On: 12/10/2020 23:53   DG Chest Port 1 View  Result Date: 12/11/2020 CLINICAL DATA:  Preoperative respiratory exam. EXAM: PORTABLE CHEST 1 VIEW COMPARISON:  April 19, 2020 FINDINGS: The heart size and mediastinal contours are within normal limits. Both lungs are clear. The visualized skeletal structures are unremarkable. IMPRESSION: No active cardiopulmonary disease. Electronically Signed   By: Virgina Norfolk M.D.   On: 12/11/2020 02:32   DG Femur Min 2 Views  Right  Result Date: 12/11/2020 CLINICAL DATA:  Fall with pain EXAM: RIGHT FEMUR 2 VIEWS COMPARISON:  04/19/2020 FINDINGS: Right hip and proximal femur appear unremarkable. Acute comminuted transverse fracture the distal femoral diaphysis just proximal to the distal metaphysis. Lateral displacement of the main distal fracture fragment, by half of the with of the bone. Fractures do not extend to the prosthetic femoral component itself. IMPRESSION: Acute comminuted transverse fracture of the distal femoral diaphysis just proximal to the distal metaphysis. Electronically Signed   By: Nelson Chimes M.D.   On: 12/11/2020 00:26    Medications:   . fluticasone  1 spray Each Nare Daily  . insulin aspart  0-9 Units Subcutaneous Q4H  . insulin degludec  21 Units Subcutaneous Q1400  . mometasone-formoterol  2 puff Inhalation BID  . montelukast  10 mg Oral QHS  . pantoprazole  40 mg Oral Daily  . pregabalin  50 mg Oral BID  . vortioxetine HBr  10 mg Oral Daily   Continuous Infusions:   LOS: 0 days   Paula Cree, MD  [facility]   [facility] How to contact the [facility] Attending or Consulting provider [place] or covering provider during after hours [place], for this patient?  1. Check the care team in [facility] and look for a) attending/consulting [facility] provider listed and b) the [facility] team listed 2. Log into [email or web address] and use [place]'s universal password to access. If you do not have the password, please contact the hospital operator. 3. Locate the [facility] provider you are looking for under [facility] and page to a number that you can be directly reached. 4. If you still have difficulty reaching the provider, please page the [facility] (Director on Call) for the Hospitalists listed on amion for assistance.  12/11/2020, 8:02 AM

## 2020-12-11 NOTE — ED Notes (Signed)
Pt complains of lower abd pain and inability to urinate. Lower abd feels distended. Bladder scanner is dead at this time. Notified MD that pt is very uncomfortable. Received order to place foley catheter.

## 2020-12-11 NOTE — H&P (Signed)
History and Physical    Paula Steele JEH:[id number] DOB: 01/01/1946 DOA: 12/10/2020  PCP: Adaline Sill, NP  Patient coming from: Home  I have personally briefly reviewed patient's old medical records in [place]  Chief Complaint: Knee pain  HPI: Paula Steele is a 75 y.o. female with medical history significant of DM2, HTN, NASH.  Pt presents to the ED after mechanical fall while leaving a store.  Tripped going down a curb and landed on legs and outstretched arms.  No head nor neck injury, no LOC, not on anticoagulants.  Pt with severe pain in R knee.  Pain worse with movement.  Pain constant.   ED Course: Distal R femur fx, complicated by presence of prior R knee joint replacement.   Review of Systems: As per HPI, otherwise all review of systems negative.  Past Medical History:  Diagnosis Date  . Arthritis     SHOULDERS--   IS CURRENTLY HAS LEFT SHOULDER PAIN  . Benign neoplasm of adrenal gland   . Benign neoplasm of colon   . Blood transfusion   . Bronchitis    10/2012- followed by ER and then PCP- Dr Melina Copa   . Chronic kidney disease    bladder incontinence   . Chronic meniscal tear of knee, right   . Colon polyp   . Degeneration of intervertebral disc, site unspecified    LUMBAR  . Diabetes mellitus without mention of complication    INSULIN AND ORAL MEDS  . Diverticulosis of colon (without mention of hemorrhage)   . Esophageal reflux    CONTROLLED W/ PRILOSEC  . History of neck pain    chronic left sided neck pain  . Hx: UTI (urinary tract infection)   . Hyperlipidemia   . Hypertension   . Insomnia DUE TO STRESS  . Left arm pain    and  heaviness since radical neck surg  . Neck pain, chronic S/P RADIAL NECK DISSECTION--  2007   LEFT SIDE OF NECK AND SHOULDER PAIN  . Oropharyngeal dysphagia MILD RIGHT SUPRAGLOTTIC REGION   PT STATES SHE EATS VERY SLOWLY AND CHEWS FOOD WELL  . Other chronic nonalcoholic liver disease    SEES DR Deatra Ina--  ?SCARRING OR CIRRHOSIS PER PET SCAN 2010  . Personal history of other diseases of digestive system    OCCASIONAL ABD. PAIN  . 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 Shortness of breath   . Shortness of breath on exertion   . 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 Urgency incontinence   . Vitamin D deficiency   . Weakness of both legs     Past Surgical History:  Procedure Laterality Date  . ABDOMINAL HYSTERECTOMY  1984   W/  LSO  . CATARACT EXTRACTION W/ INTRAOCULAR LENS  IMPLANT, BILATERAL    . CHOLECYSTECTOMY  15 YRS AGO  . DIRECT LARYNGOSCOPY  2007   W/ LEFT NECK MASS EXCISION  . EXCISION LEFT SEBACOUS CYST  05-14-2011 W/  LOCAL  . KNEE ARTHROSCOPY  09/26/2011   Procedure: ARTHROSCOPY KNEE;  Surgeon: Gearlean Alf;  Location: [facility];  Service: Orthopedics;  Laterality: Right;  right knee arthroscopy with meniscal tear  . KNEE ARTHROSCOPY  03/25/2012   Procedure: ARTHROSCOPY KNEE;  Surgeon: Gearlean Alf, MD;  Location: [facility];  Service:  Orthopedics;  Laterality: Right;  With Debridement of medial lateral meniscus  . LAPAROSCOPIC SALPINGOOPHERECTOMY  2001   RIGHT  W/ LYSIS AHESIONS  . NECK MASS EXCISION  2007   LEFT  . RADICAL NECK DISSECTION  2007   W/ LEFT TONSILLECTOMY AND LEFT EXCISION LEFT TONGUE BASE DUE TO CANCER  . SHOULDER SURGERY  1990   LEFT  . TONSILLECTOMY  01-31-2009   RIGHT (DUE TO MASS)  . TONSILLECTOMY  2007   LEFT W/ NECK DISSECTION DUE TO CANCER  . TOTAL KNEE ARTHROPLASTY Right 11/30/2012   Procedure: TOTAL KNEE ARTHROPLASTY;  Surgeon: Loanne Drilling, MD;  Location: [facility];  Service: Orthopedics;  Laterality: Right;     reports that she quit smoking about 36 years ago. Her smoking use included cigarettes. She quit after 15.00 years of use. She has never used smokeless tobacco. She reports that she does not  drink alcohol and does not use drugs.  Allergies  Allergen Reactions  . Actos [Pioglitazone]     edema  . Bydureon [Exenatide]     nausea  . Metformin And Related     diahrrhea    Family History  Problem Relation Age of Onset  . Thyroid cancer Brother   . Diabetes Mother   . Diabetes Brother   . Heart disease Father   . Heart disease Brother      Prior to Admission medications   Medication Sig Start Date End Date Taking? Authorizing Provider  albuterol (VENTOLIN HFA) 108 (90 Base) MCG/ACT inhaler Inhale 2 puffs into the lungs every 6 (six) hours as needed for wheezing or shortness of breath.   Yes [provider]  celecoxib (CELEBREX) 100 MG capsule Take 100 mg by mouth at bedtime. 08/17/20  Yes [provider]  diazepam (VALIUM) 10 MG tablet Take 0.5 tablets (5 mg total) by mouth every 12 (twelve) hours as needed for anxiety. Muscle spasms Patient taking differently: Take 5 mg by mouth every 8 (eight) hours as needed for anxiety. Muscle spasms 12/08/12  Yes Dhungel, Nishant, MD  diclofenac Sodium (VOLTAREN) 1 % GEL Apply 2-4 g topically daily as needed (pain). 11/01/20  Yes [provider]  fluticasone (FLONASE) 50 MCG/ACT nasal spray Place 1 spray into both nostrils daily. 11/13/20  Yes [provider]  fluticasone-salmeterol (ADVAIR HFA) 115-21 MCG/ACT inhaler Inhale 2 puffs into the lungs daily.   Yes [provider]  glucose blood (ACCU-CHEK GUIDE) test strip Use as instructed 02/21/17  Yes Nida, Denman George, MD  Meloxicam (MOBIC PO) Take by mouth at bedtime.   Yes [provider]  montelukast (SINGULAIR) 10 MG tablet Take 10 mg by mouth at bedtime.   Yes [provider]  NARCAN 4 MG/0.1ML LIQD nasal spray kit 1 spray once. 09/11/20  Yes [provider]  Omeprazole (PRILOSEC PO) Take 20 mg by mouth daily as needed (acid reflux).   Yes [provider]  oxyCODONE-acetaminophen (PERCOCET) 10-325 MG  tablet Take 1 tablet by mouth every 8 (eight) hours as needed for pain. 05/19/19  Yes [provider]  OZEMPIC, 1 MG/DOSE, 4 MG/3ML SOPN Inject 1 mg into the skin every Sunday. 08/03/20  Yes [provider]  pregabalin (LYRICA) 50 MG capsule Take 50 mg by mouth in the morning and at bedtime. 12/05/20  Yes [provider]  promethazine (PHENERGAN) 25 MG tablet Take 25 mg by mouth every 6 (six) hours as needed for nausea or vomiting.   Yes [provider]  QUEtiapine (SEROQUEL) 25 MG tablet Take 25 mg by mouth at bedtime. 12/05/20  Yes [provider]  tiZANidine (ZANAFLEX) 4 MG tablet Take 4 mg by mouth every 6 (six) hours as needed for muscle spasms.   Yes [provider]  TRESIBA FLEXTOUCH 100 UNIT/ML SOPN FlexTouch Pen Inject 42 Units into the skin daily. 04/28/19  Yes [provider]  vitamin C (ASCORBIC ACID) 500 MG tablet Take 500 mg by mouth daily.   Yes [provider]  Vitamin D, Cholecalciferol, 25 MCG (1000 UT) CAPS Take 1,000 units of lipase by mouth in the morning and at bedtime.   Yes [provider]  vortioxetine HBr (TRINTELLIX) 10 MG TABS tablet Take 10 mg by mouth daily. 08/01/20  Yes [provider]    Physical Exam: Vitals:   12/11/20 0126 12/11/20 0200 12/11/20 0326 12/11/20 0400  BP: (!) 142/81 129/73 135/74 (!) 141/73  Pulse: 100 99 96 100  Resp: 17 (!) $Remo'22 18 19  'JgdRL$ Temp:      TempSrc:      SpO2: 97% 96% 92% 95%    Constitutional: NAD, calm, comfortable Eyes: PERRL, lids and conjunctivae normal ENMT: Mucous membranes are moist. Posterior pharynx clear of any exudate or lesions.Normal dentition.  Neck: normal, supple, no masses, no thyromegaly Respiratory: clear to auscultation bilaterally, no wheezing, no crackles. Normal respiratory effort. No accessory muscle use.  Cardiovascular: Regular rate and rhythm, no murmurs / rubs / gallops. No extremity edema. 2+ pedal pulses. No carotid  bruits.  Abdomen: no tenderness, no masses palpated. No hepatosplenomegaly. Bowel sounds positive.  Musculoskeletal: R knee TTP. Skin: no rashes, lesions, ulcers. No induration Neurologic: CN 2-12 grossly intact. Sensation intact, DTR normal. Strength 5/5 in all 4.  Psychiatric: Normal judgment and insight. Alert and oriented x 3. Normal mood.    Labs on Admission: I have personally reviewed following labs and imaging studies  CBC: Recent Labs  Lab 12/11/20 0020  WBC 6.6  NEUTROABS 5.3  HGB 12.9  HCT 37.7  MCV 86.5  PLT 027   Basic Metabolic Panel: Recent Labs  Lab 12/11/20 0020  NA 138  K 3.7  CL 103  CO2 26  GLUCOSE 242*  BUN 16  CREATININE 0.78  CALCIUM 9.4   GFR: CrCl cannot be calculated (Unknown ideal weight.). Liver Function Tests: No results for input(s): AST, ALT, ALKPHOS, BILITOT, PROT, ALBUMIN in the last 168 hours. No results for input(s): LIPASE, AMYLASE in the last 168 hours. No results for input(s): AMMONIA in the last 168 hours. Coagulation Profile: No results for input(s): INR, PROTIME in the last 168 hours. Cardiac Enzymes: No results for input(s): CKTOTAL, CKMB, CKMBINDEX, TROPONINI in the last 168 hours. BNP (last 3 results) No results for input(s): PROBNP in the last 8760 hours. HbA1C: No results for input(s): HGBA1C in the last 72 hours. CBG: No results for input(s): GLUCAP in the last 168 hours. Lipid Profile: No results for input(s): CHOL, HDL, LDLCALC, TRIG, CHOLHDL, LDLDIRECT in the last 72 hours. Thyroid Function Tests: No results for input(s): TSH, T4TOTAL, FREET4, T3FREE, THYROIDAB in the last 72 hours. Anemia Panel: No results for input(s): VITAMINB12, FOLATE, FERRITIN, TIBC, IRON, RETICCTPCT in the last 72 hours. Urine analysis:    Component Value Date/Time   COLORURINE YELLOW 04/09/2016 [place] 04/09/2016 1148   LABSPEC <1.005 (L) 04/09/2016 1148   PHURINE 5.5 04/09/2016 1148   GLUCOSEU 500 (A) 04/09/2016  1148   HGBUR NEGATIVE 04/09/2016 1148  BILIRUBINUR NEGATIVE 04/09/2016 1148   KETONESUR NEGATIVE 04/09/2016 1148   PROTEINUR NEGATIVE 04/09/2016 1148   UROBILINOGEN 0.2 12/06/2012 1934   NITRITE NEGATIVE 04/09/2016 1148   LEUKOCYTESUR TRACE (A) 04/09/2016 1148    Radiological Exams on Admission: DG Lumbar Spine Complete  Result Date: 12/11/2020 CLINICAL DATA:  Fall with back pain. EXAM: LUMBAR SPINE - COMPLETE 4+ VIEW COMPARISON:  10/08/2017 FINDINGS: No acute lumbar region finding. Five lumbar type vertebral bodies in normal alignment. Chronic disc space narrowing L2-3 through L4-5. Lower lumbar facet osteoarthritis. Cannot rule out a minimal superior endplate fracture at T26, but this area was not well evaluated on the lumbar study. Is the pain higher, in the lower thoracic region? IMPRESSION: 1. No acute lumbar region finding. Chronic disc space narrowing L2-3 through L4-5. Lower lumbar facet osteoarthritis. 2. Cannot rule out a minimal T11 superior endplate fracture. Is the patient's pain in the lower thoracic region? Electronically Signed   By: Nelson Chimes M.D.   On: 12/11/2020 00:28   CT Head Wo Contrast  Result Date: 12/10/2020 CLINICAL DATA:  Trauma to the head and neck. EXAM: CT HEAD WITHOUT CONTRAST CT CERVICAL SPINE WITHOUT CONTRAST TECHNIQUE: Multidetector CT imaging of the head and cervical spine was performed following the standard protocol without intravenous contrast. Multiplanar CT image reconstructions of the cervical spine were also generated. COMPARISON:  12/06/2012.  03/16/2020. FINDINGS: CT HEAD FINDINGS Brain: Age related atrophy. Ordinary chronic small-vessel ischemic changes of the hemispheric white matter. No sign of acute infarction, mass lesion, hemorrhage, hydrocephalus or extra-axial collection. Vascular: There is atherosclerotic calcification of the major vessels at the base of the brain. Skull: Negative Sinuses/Orbits: Clear/normal Other: None CT CERVICAL SPINE  FINDINGS Alignment: Normal Skull base and vertebrae: No fracture or primary bone lesion. Soft tissues and spinal canal: No significant soft tissue finding. Disc levels: Minimal mid cervical spondylosis. No significant narrowing of the canal or foramina identified. Mild facet osteoarthritis on the right at C2-3 and C3-4. Upper chest: Negative Other: None IMPRESSION: HEAD CT: No acute or traumatic finding. Age related atrophy and chronic small-vessel ischemic changes. CERVICAL SPINE CT: No acute or traumatic finding. Minimal spondylosis and facet osteoarthritis. Electronically Signed   By: Nelson Chimes M.D.   On: 12/10/2020 23:53   CT Cervical Spine Wo Contrast  Result Date: 12/10/2020 CLINICAL DATA:  Trauma to the head and neck. EXAM: CT HEAD WITHOUT CONTRAST CT CERVICAL SPINE WITHOUT CONTRAST TECHNIQUE: Multidetector CT imaging of the head and cervical spine was performed following the standard protocol without intravenous contrast. Multiplanar CT image reconstructions of the cervical spine were also generated. COMPARISON:  12/06/2012.  03/16/2020. FINDINGS: CT HEAD FINDINGS Brain: Age related atrophy. Ordinary chronic small-vessel ischemic changes of the hemispheric white matter. No sign of acute infarction, mass lesion, hemorrhage, hydrocephalus or extra-axial collection. Vascular: There is atherosclerotic calcification of the major vessels at the base of the brain. Skull: Negative Sinuses/Orbits: Clear/normal Other: None CT CERVICAL SPINE FINDINGS Alignment: Normal Skull base and vertebrae: No fracture or primary bone lesion. Soft tissues and spinal canal: No significant soft tissue finding. Disc levels: Minimal mid cervical spondylosis. No significant narrowing of the canal or foramina identified. Mild facet osteoarthritis on the right at C2-3 and C3-4. Upper chest: Negative Other: None IMPRESSION: HEAD CT: No acute or traumatic finding. Age related atrophy and chronic small-vessel ischemic changes. CERVICAL  SPINE CT: No acute or traumatic finding. Minimal spondylosis and facet osteoarthritis. Electronically Signed   By: Jan Fireman.D.  On: 12/10/2020 23:53   DG Chest Port 1 View  Result Date: 12/11/2020 CLINICAL DATA:  Preoperative respiratory exam. EXAM: PORTABLE CHEST 1 VIEW COMPARISON:  April 19, 2020 FINDINGS: The heart size and mediastinal contours are within normal limits. Both lungs are clear. The visualized skeletal structures are unremarkable. IMPRESSION: No active cardiopulmonary disease. Electronically Signed   By: Aram Candela M.D.   On: 12/11/2020 02:32   DG Femur Min 2 Views Right  Result Date: 12/11/2020 CLINICAL DATA:  Fall with pain EXAM: RIGHT FEMUR 2 VIEWS COMPARISON:  04/19/2020 FINDINGS: Right hip and proximal femur appear unremarkable. Acute comminuted transverse fracture the distal femoral diaphysis just proximal to the distal metaphysis. Lateral displacement of the main distal fracture fragment, by half of the with of the bone. Fractures do not extend to the prosthetic femoral component itself. IMPRESSION: Acute comminuted transverse fracture of the distal femoral diaphysis just proximal to the distal metaphysis. Electronically Signed   By: Paulina Fusi M.D.   On: 12/11/2020 00:26    EKG: Independently reviewed.  Assessment/Plan Principal Problem:   Closed fracture of right distal femur (HCC) Active Problems:   DM2 (diabetes mellitus, type 2) (HCC)   NASH (nonalcoholic steatohepatitis)   Hypertension    1. R distal femur fx - 1. Hip fx pathway 2. Pain ctrl with IV dilaudid 3. EDP spoke with Dr. Charlann Boxer 1. Dr. Charlann Boxer will discuss with Dr. Despina Hick (who did original surgery on knee) in AM and see if they want to do this repair here 2. But due to complexity of fracture (involving hardware) this may end up having to go to Dr. Carola Frost over at [facility] 4. Keeping pt NPO, hopefully to get OR repair somewhere. 5. Gupta score = 0.8% 2. DM2 - 1. Hold home meds 2. Will put on  Lantus 21u daily (takes 42u daily at home) 3. Sensitive SSI Q4H 3. HTN - 1. Not on any chronic home meds for this. 4. Chronic pain syndrome - 1. Cont PRN valium for muscle spasms 2. Cont lyrica 3. Hold percocet (using IV dilaudid for acute pain). 4. Hold NSAIDs  DVT prophylaxis: SCDs Code Status: Full Family Communication: No family in room Disposition Plan: SNF vs CIR after repair Consults called: EDP spoke with Dr. Charlann Boxer Admission status: Admit to inpatient  Severity of Illness: The appropriate patient status for this patient is INPATIENT. Inpatient status is judged to be reasonable and necessary in order to provide the required intensity of service to ensure the patient's safety. The patient's presenting symptoms, physical exam findings, and initial radiographic and laboratory data in the context of their chronic comorbidities is felt to place them at high risk for further clinical deterioration. Furthermore, it is not anticipated that the patient will be medically stable for discharge from the hospital within 2 midnights of admission. The following factors support the patient status of inpatient.   IP status for surgical repair of femur fx.   * I certify that at the point of admission it is my clinical judgment that the patient will require inpatient hospital care spanning beyond 2 midnights from the point of admission due to high intensity of service, high risk for further deterioration and high frequency of surveillance required.*    Jamecia Lerman M. DO [facility]  How to contact the [facility] Attending or Consulting provider 7A - 7P or covering provider during after hours 7P -7A, for this patient?  1. Check the care team in [facility] and look for a) attending/consulting [facility]  provider listed and b) the [facility] team listed 2. Log into [email or web address]  Amion Physician Scheduling and messaging for groups and whole hospitals  On call and physician scheduling software for group practices,  residents, hospitalists and other medical providers for call, clinic, rotation and shift schedules. OnCall [employer] is a hospital-wide system for scheduling doctors and paging doctors on call. EasyPlot is for scientific plotting and data analysis.  [email or web address]  and use [facility]'s universal password to access. If you do not have the password, please contact the hospital operator.  3. Locate the [facility] provider you are looking for under [facility] and page to a number that you can be directly reached. 4. If you still have difficulty reaching the provider, please page the [facility] (Director on Call) for the Hospitalists listed on amion for assistance.  12/11/2020, 4:42 AM

## 2020-12-11 NOTE — Plan of Care (Signed)
Received Pt on a stretcher, alert and oriented x4, transferred to bed, VS taken and recorded. Kept NPO. Pt complained of pain 10/10 during transfer but pain had eased after repositioning.  Problem: Health Behavior/Discharge Planning: Goal: Ability to manage health-related needs will improve Outcome: Progressing   Problem: Clinical Measurements: Goal: Ability to maintain clinical measurements within normal limits will improve Outcome: Progressing   Problem: Nutrition: Goal: Adequate nutrition will be maintained Outcome: Progressing   Problem: Coping: Goal: Level of anxiety will decrease Outcome: Progressing   Problem: Elimination: Goal: Will not experience complications related to bowel motility Outcome: Progressing   Problem: Pain Managment: Goal: General experience of comfort will improve Outcome: Progressing   Problem: Safety: Goal: Ability to remain free from injury will improve Outcome: Progressing   Problem: Skin Integrity: Goal: Risk for impaired skin integrity will decrease Outcome: Progressing

## 2020-12-11 NOTE — Progress Notes (Signed)
Orthopedic Tech Progress Note Patient Details:  Paula Steele [birth year] [id number]  Ortho Devices Type of Ortho Device: Knee Immobilizer Ortho Device/Splint Location: rle. RN helped me. Ortho Device/Splint Interventions: Ordered,Application,Adjustment   Post Interventions Patient Tolerated: Well Instructions Provided: Care of device,Adjustment of device   Karolee Stamps 12/11/2020, 5:00 AM

## 2020-12-12 ENCOUNTER — Encounter (HOSPITAL_COMMUNITY): Payer: Self-pay | Admitting: Internal Medicine

## 2020-12-12 DIAGNOSIS — S72401A Unspecified fracture of lower end of right femur, initial encounter for closed fracture: Secondary | ICD-10-CM | POA: Diagnosis not present

## 2020-12-12 DIAGNOSIS — R338 Other retention of urine: Secondary | ICD-10-CM

## 2020-12-12 DIAGNOSIS — E119 Type 2 diabetes mellitus without complications: Secondary | ICD-10-CM | POA: Diagnosis not present

## 2020-12-12 DIAGNOSIS — I10 Essential (primary) hypertension: Secondary | ICD-10-CM | POA: Diagnosis not present

## 2020-12-12 LAB — GLUCOSE, CAPILLARY
Glucose-Capillary: 194 mg/dL — ABNORMAL HIGH (ref 70–99)
Glucose-Capillary: 207 mg/dL — ABNORMAL HIGH (ref 70–99)
Glucose-Capillary: 216 mg/dL — ABNORMAL HIGH (ref 70–99)
Glucose-Capillary: 229 mg/dL — ABNORMAL HIGH (ref 70–99)
Glucose-Capillary: 229 mg/dL — ABNORMAL HIGH (ref 70–99)
Glucose-Capillary: 280 mg/dL — ABNORMAL HIGH (ref 70–99)

## 2020-12-12 MED ORDER — ADULT MULTIVITAMIN W/MINERALS CH
1.0000 | ORAL_TABLET | Freq: Every day | ORAL | Status: DC
  Administered 2020-12-12 – 2020-12-18 (×6): 1 via ORAL
  Filled 2020-12-12 (×6): qty 1

## 2020-12-12 MED ORDER — GLUCERNA SHAKE PO LIQD
237.0000 mL | Freq: Three times a day (TID) | ORAL | Status: DC
  Administered 2020-12-12 – 2020-12-18 (×12): 237 mL via ORAL
  Filled 2020-12-12: qty 474

## 2020-12-12 NOTE — Consult Note (Signed)
Reason for Consult:Right distal femur fx Referring Physician: Salli Quarry Time called: 1206 Time at bedside: 1259   Paula Steele is an 75 y.o. female.  HPI: Paula Steele was leaving the grocery store with her daughter when she lost her balance going down a small grade and fell forwards. She put her hands out to brace her fall but hit on her knees hard. She had immediate pain in the right knee and could not get up. She was brought to the ED where x-rays showed a distal femur fx and orthopedic surgery was consulted. Due to the periprosthetic nature of the fracture orthopedic trauma consultation was requested. She lives at home and only uses a cane to ambulate if she's going to be on uneven ground.  Past Medical History:  Diagnosis Date  . Arthritis     SHOULDERS--   IS CURRENTLY HAS LEFT SHOULDER PAIN  . Benign neoplasm of adrenal gland   . Benign neoplasm of colon   . Blood transfusion   . Bronchitis    10/2012- followed by ER and then PCP- Dr Melina Copa   . Chronic kidney disease    bladder incontinence   . Chronic meniscal tear of knee, right   . Colon polyp   . Degeneration of intervertebral disc, site unspecified    LUMBAR  . Diabetes mellitus without mention of complication    INSULIN AND ORAL MEDS  . Diverticulosis of colon (without mention of hemorrhage)   . Esophageal reflux    CONTROLLED W/ PRILOSEC  . Femur fracture, right ([place]) 12/10/2020   closed intial encouter  . History of neck pain    chronic left sided neck pain  . Hx: UTI (urinary tract infection)   . Hyperlipidemia   . Hypertension   . Insomnia DUE TO STRESS  . Left arm pain    and  heaviness since radical neck surg  . Neck pain, chronic S/P RADIAL NECK DISSECTION--  2007   LEFT SIDE OF NECK AND SHOULDER PAIN  . Oropharyngeal dysphagia MILD RIGHT SUPRAGLOTTIC REGION   PT STATES SHE EATS VERY SLOWLY AND CHEWS FOOD WELL  . Other chronic nonalcoholic liver disease    SEES DR Deatra Ina-- ?SCARRING OR CIRRHOSIS PER PET SCAN  2010  . Personal history of other diseases of digestive system    OCCASIONAL ABD. PAIN  . 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 Shortness of breath   . Shortness of breath on exertion   . 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 Urgency incontinence   . Vitamin D deficiency   . Weakness of both legs     Past Surgical History:  Procedure Laterality Date  . ABDOMINAL HYSTERECTOMY  1984   W/  LSO  . CATARACT EXTRACTION W/ INTRAOCULAR LENS  IMPLANT, BILATERAL    . CHOLECYSTECTOMY  15 YRS AGO  . DIRECT LARYNGOSCOPY  2007   W/ LEFT NECK MASS EXCISION  . EXCISION LEFT SEBACOUS CYST  05-14-2011 W/  LOCAL  . KNEE ARTHROSCOPY  09/26/2011   Procedure: ARTHROSCOPY KNEE;  Surgeon: Gearlean Alf;  Location: [facility];  Service: Orthopedics;  Laterality: Right;  right knee arthroscopy with meniscal tear  . KNEE ARTHROSCOPY  03/25/2012   Procedure: ARTHROSCOPY KNEE;  Surgeon: Gearlean Alf, MD;  Location: [facility];  Service: Orthopedics;  Laterality: Right;  With Debridement of medial lateral meniscus  . LAPAROSCOPIC SALPINGOOPHERECTOMY  2001   RIGHT  W/ LYSIS AHESIONS  . NECK MASS EXCISION  2007   LEFT  . RADICAL NECK DISSECTION  2007   W/ LEFT TONSILLECTOMY AND LEFT EXCISION LEFT TONGUE BASE DUE TO CANCER  . SHOULDER SURGERY  1990   LEFT  . TONSILLECTOMY  01-31-2009   RIGHT (DUE TO MASS)  . TONSILLECTOMY  2007   LEFT W/ NECK DISSECTION DUE TO CANCER  . TOTAL KNEE ARTHROPLASTY Right 11/30/2012   Procedure: TOTAL KNEE ARTHROPLASTY;  Surgeon: Gearlean Alf, MD;  Location: [facility];  Service: Orthopedics;  Laterality: Right;    Family History  Problem Relation Age of Onset  . Thyroid cancer Brother   . Diabetes Mother   . Diabetes Brother   . Heart disease Father   . Heart disease Brother     Social History:  reports that she quit smoking  about 36 years ago. Her smoking use included cigarettes. She quit after 15.00 years of use. She has never used smokeless tobacco. She reports that she does not drink alcohol and does not use drugs.  Allergies:  Allergies  Allergen Reactions  . Actos [Pioglitazone]     edema  . Bydureon [Exenatide]     nausea  . Metformin And Related     diahrrhea    Medications: I have reviewed the patient's current medications.  Results for orders placed or performed during the hospital encounter of 12/10/20 (from the past 48 hour(s))  Basic metabolic panel     Status: Abnormal   Collection Time: 12/11/20 12:20 AM  Result Value Ref Range   Sodium 138 135 - 145 mmol/L   Potassium 3.7 3.5 - 5.1 mmol/L   Chloride 103 98 - 111 mmol/L   CO2 26 22 - 32 mmol/L   Glucose, Bld 242 (H) 70 - 99 mg/dL    Comment: Glucose reference range applies only to samples taken after fasting for at least 8 hours.   BUN 16 8 - 23 mg/dL   Creatinine, Ser 0.78 0.44 - 1.00 mg/dL   Calcium 9.4 8.9 - 10.3 mg/dL   GFR, Estimated >60 >60 mL/min    Comment: (NOTE) Calculated using the CKD-EPI Creatinine Equation (2021)    Anion gap 9 5 - 15    Comment: Performed at [facility] [address] [place], [place] [address]  CBC with Differential     Status: None   Collection Time: 12/11/20 12:20 AM  Result Value Ref Range   WBC 6.6 4.0 - 10.5 K/uL   RBC 4.36 3.87 - 5.11 MIL/uL   Hemoglobin 12.9 12.0 - 15.0 g/dL   HCT 37.7 36.0 - 46.0 %   MCV 86.5 80.0 - 100.0 fL   MCH 29.6 26.0 - 34.0 pg   MCHC 34.2 30.0 - 36.0 g/dL   RDW 14.4 11.5 - 15.5 %   Platelets 154 150 - 400 K/uL   nRBC 0.0 0.0 - 0.2 %   Neutrophils Relative % 80 %   Neutro Abs 5.3 1.7 - 7.7 K/uL   Lymphocytes Relative 15 %   Lymphs Abs 1.0 0.7 - 4.0 K/uL   Monocytes Relative 3 %   Monocytes Absolute 0.2 0.1 - 1.0 K/uL   Eosinophils Relative 1 %   Eosinophils Absolute 0.0 0.0 - 0.5 K/uL   Basophils Relative 1 %   Basophils Absolute 0.0 0.0 - 0.1  K/uL   Immature Granulocytes 0 %  Abs Immature Granulocytes 0.02 0.00 - 0.07 K/uL    Comment: Performed at [facility], [place] [address] [place], [place] [address]  SARS CORONAVIRUS 2 (TAT 6-24 HRS) Nasopharyngeal Nasopharyngeal Swab     Status: None   Collection Time: 12/11/20  2:08 AM   Specimen: Nasopharyngeal Swab  Result Value Ref Range   SARS Coronavirus 2 NEGATIVE NEGATIVE    Comment: (NOTE) SARS-CoV-2 target nucleic acids are NOT DETECTED.  The SARS-CoV-2 RNA is generally detectable in upper and lower respiratory specimens during the acute phase of infection. Negative results do not preclude SARS-CoV-2 infection, do not rule out co-infections with other pathogens, and should not be used as the sole basis for treatment or other patient management decisions. Negative results must be combined with clinical observations, patient history, and epidemiological information. The expected result is Negative.  Fact Sheet for Patients: SugarRoll.be  Fact Sheet for Healthcare Providers: https://www.woods-mathews.com/  This test is not yet approved or cleared by the [place] FDA and  has been authorized for detection and/or diagnosis of SARS-CoV-2 by FDA under an Emergency Use Authorization (EUA). This EUA will remain  in effect (meaning this test can be used) for the duration of the COVID-19 declaration under Se ction 564(b)(1) of the Act, 21 U.S.C. section 360bbb-3(b)(1), unless the authorization is terminated or revoked sooner.  Performed at [facility], [place] [address], [place], [place] [address]   Hemoglobin A1c     Status: Abnormal   Collection Time: 12/11/20  5:11 AM  Result Value Ref Range   Hgb A1c MFr Bld 8.6 (H) 4.8 - 5.6 %    Comment: (NOTE) Pre diabetes:          5.7%-6.4%  Diabetes:              >6.4%  Glycemic control for   <7.0% adults with diabetes    Mean Plasma Glucose 200.12 mg/dL    Comment:  Performed at [facility] [address], [place], [place] [address]  CBG monitoring, ED     Status: Abnormal   Collection Time: 12/11/20  6:16 AM  Result Value Ref Range   Glucose-Capillary 181 (H) 70 - 99 mg/dL    Comment: Glucose reference range applies only to samples taken after fasting for at least 8 hours.  CBG monitoring, ED     Status: Abnormal   Collection Time: 12/11/20  7:51 AM  Result Value Ref Range   Glucose-Capillary 169 (H) 70 - 99 mg/dL    Comment: Glucose reference range applies only to samples taken after fasting for at least 8 hours.  Urinalysis, Routine w reflex microscopic Urine, Catheterized     Status: Abnormal   Collection Time: 12/11/20  8:51 AM  Result Value Ref Range   Color, Urine YELLOW YELLOW   APPearance CLEAR CLEAR   Specific Gravity, Urine 1.027 1.005 - 1.030   pH 6.0 5.0 - 8.0   Glucose, UA >=500 (A) NEGATIVE mg/dL   Hgb urine dipstick NEGATIVE NEGATIVE   Bilirubin Urine NEGATIVE NEGATIVE   Ketones, ur NEGATIVE NEGATIVE mg/dL   Protein, ur NEGATIVE NEGATIVE mg/dL   Nitrite NEGATIVE NEGATIVE   Leukocytes,Ua NEGATIVE NEGATIVE   RBC / HPF 0-5 0 - 5 RBC/hpf   WBC, UA 0-5 0 - 5 WBC/hpf   Bacteria, UA NONE SEEN NONE SEEN   Squamous Epithelial / LPF 0-5 0 - 5   Mucus PRESENT    Non Squamous Epithelial 0-5 (A) NONE  SEEN    Comment: Performed at [facility], [place] [address], [place], [place] [address]  CBG monitoring, ED     Status: Abnormal   Collection Time: 12/11/20 11:13 AM  Result Value Ref Range   Glucose-Capillary 114 (H) 70 - 99 mg/dL    Comment: Glucose reference range applies only to samples taken after fasting for at least 8 hours.  CBG monitoring, ED     Status: Abnormal   Collection Time: 12/11/20  3:53 PM  Result Value Ref Range   Glucose-Capillary 110 (H) 70 - 99 mg/dL    Comment: Glucose reference range applies only to samples taken after fasting for at least 8 hours.  Hepatic function panel     Status: None    Collection Time: 12/11/20  6:11 PM  Result Value Ref Range   Total Protein 7.9 6.5 - 8.1 g/dL   Albumin 3.6 3.5 - 5.0 g/dL   AST 36 15 - 41 U/L   ALT 33 0 - 44 U/L   Alkaline Phosphatase 125 38 - 126 U/L   Total Bilirubin 0.7 0.3 - 1.2 mg/dL   Bilirubin, Direct 0.2 0.0 - 0.2 mg/dL   Indirect Bilirubin 0.5 0.3 - 0.9 mg/dL    Comment: Performed at [facility] [address], [place], [place] [address]  Surgical pcr screen     Status: None   Collection Time: 12/11/20  6:14 PM   Specimen: Nasal Mucosa; Nasal Swab  Result Value Ref Range   MRSA, PCR NEGATIVE NEGATIVE   Staphylococcus aureus NEGATIVE NEGATIVE    Comment: (NOTE) The Xpert SA Assay (FDA approved for NASAL specimens in patients 23 years of age and older), is one component of a comprehensive surveillance program. It is not intended to diagnose infection nor to guide or monitor treatment. Performed at [facility], [place] [address] [place], [place] [address]   Glucose, capillary     Status: None   Collection Time: 12/11/20  8:28 PM  Result Value Ref Range   Glucose-Capillary 99 70 - 99 mg/dL    Comment: Glucose reference range applies only to samples taken after fasting for at least 8 hours.  Glucose, capillary     Status: Abnormal   Collection Time: 12/12/20 12:46 AM  Result Value Ref Range   Glucose-Capillary 216 (H) 70 - 99 mg/dL    Comment: Glucose reference range applies only to samples taken after fasting for at least 8 hours.  Glucose, capillary     Status: Abnormal   Collection Time: 12/12/20  5:30 AM  Result Value Ref Range   Glucose-Capillary 207 (H) 70 - 99 mg/dL    Comment: Glucose reference range applies only to samples taken after fasting for at least 8 hours.  Glucose, capillary     Status: Abnormal   Collection Time: 12/12/20  9:27 AM  Result Value Ref Range   Glucose-Capillary 229 (H) 70 - 99 mg/dL    Comment: Glucose reference range applies only to samples taken after fasting for at  least 8 hours.  Glucose, capillary     Status: Abnormal   Collection Time: 12/12/20 11:29 AM  Result Value Ref Range   Glucose-Capillary 194 (H) 70 - 99 mg/dL    Comment: Glucose reference range applies only to samples taken after fasting for at least 8 hours.    DG Lumbar Spine Complete  Result Date: 12/11/2020 CLINICAL DATA:  Fall with back pain. EXAM: LUMBAR SPINE - COMPLETE 4+  VIEW COMPARISON:  10/08/2017 FINDINGS: No acute lumbar region finding. Five lumbar type vertebral bodies in normal alignment. Chronic disc space narrowing L2-3 through L4-5. Lower lumbar facet osteoarthritis. Cannot rule out a minimal superior endplate fracture at L24, but this area was not well evaluated on the lumbar study. Is the pain higher, in the lower thoracic region? IMPRESSION: 1. No acute lumbar region finding. Chronic disc space narrowing L2-3 through L4-5. Lower lumbar facet osteoarthritis. 2. Cannot rule out a minimal T11 superior endplate fracture. Is the patient's pain in the lower thoracic region? Electronically Signed   By: Nelson Chimes M.D.   On: 12/11/2020 00:28   CT Head Wo Contrast  Result Date: 12/10/2020 CLINICAL DATA:  Trauma to the head and neck. EXAM: CT HEAD WITHOUT CONTRAST CT CERVICAL SPINE WITHOUT CONTRAST TECHNIQUE: Multidetector CT imaging of the head and cervical spine was performed following the standard protocol without intravenous contrast. Multiplanar CT image reconstructions of the cervical spine were also generated. COMPARISON:  12/06/2012.  03/16/2020. FINDINGS: CT HEAD FINDINGS Brain: Age related atrophy. Ordinary chronic small-vessel ischemic changes of the hemispheric white matter. No sign of acute infarction, mass lesion, hemorrhage, hydrocephalus or extra-axial collection. Vascular: There is atherosclerotic calcification of the major vessels at the base of the brain. Skull: Negative Sinuses/Orbits: Clear/normal Other: None CT CERVICAL SPINE FINDINGS Alignment: Normal Skull base and  vertebrae: No fracture or primary bone lesion. Soft tissues and spinal canal: No significant soft tissue finding. Disc levels: Minimal mid cervical spondylosis. No significant narrowing of the canal or foramina identified. Mild facet osteoarthritis on the right at C2-3 and C3-4. Upper chest: Negative Other: None IMPRESSION: HEAD CT: No acute or traumatic finding. Age related atrophy and chronic small-vessel ischemic changes. CERVICAL SPINE CT: No acute or traumatic finding. Minimal spondylosis and facet osteoarthritis. Electronically Signed   By: Nelson Chimes M.D.   On: 12/10/2020 23:53   CT Cervical Spine Wo Contrast  Result Date: 12/10/2020 CLINICAL DATA:  Trauma to the head and neck. EXAM: CT HEAD WITHOUT CONTRAST CT CERVICAL SPINE WITHOUT CONTRAST TECHNIQUE: Multidetector CT imaging of the head and cervical spine was performed following the standard protocol without intravenous contrast. Multiplanar CT image reconstructions of the cervical spine were also generated. COMPARISON:  12/06/2012.  03/16/2020. FINDINGS: CT HEAD FINDINGS Brain: Age related atrophy. Ordinary chronic small-vessel ischemic changes of the hemispheric white matter. No sign of acute infarction, mass lesion, hemorrhage, hydrocephalus or extra-axial collection. Vascular: There is atherosclerotic calcification of the major vessels at the base of the brain. Skull: Negative Sinuses/Orbits: Clear/normal Other: None CT CERVICAL SPINE FINDINGS Alignment: Normal Skull base and vertebrae: No fracture or primary bone lesion. Soft tissues and spinal canal: No significant soft tissue finding. Disc levels: Minimal mid cervical spondylosis. No significant narrowing of the canal or foramina identified. Mild facet osteoarthritis on the right at C2-3 and C3-4. Upper chest: Negative Other: None IMPRESSION: HEAD CT: No acute or traumatic finding. Age related atrophy and chronic small-vessel ischemic changes. CERVICAL SPINE CT: No acute or traumatic finding.  Minimal spondylosis and facet osteoarthritis. Electronically Signed   By: Nelson Chimes M.D.   On: 12/10/2020 23:53   DG Chest Port 1 View  Result Date: 12/11/2020 CLINICAL DATA:  Preoperative respiratory exam. EXAM: PORTABLE CHEST 1 VIEW COMPARISON:  April 19, 2020 FINDINGS: The heart size and mediastinal contours are within normal limits. Both lungs are clear. The visualized skeletal structures are unremarkable. IMPRESSION: No active cardiopulmonary disease. Electronically Signed   By: Hoover Browns  Houston M.D.   On: 12/11/2020 02:32   DG Femur Min 2 Views Right  Result Date: 12/11/2020 CLINICAL DATA:  Fall with pain EXAM: RIGHT FEMUR 2 VIEWS COMPARISON:  04/19/2020 FINDINGS: Right hip and proximal femur appear unremarkable. Acute comminuted transverse fracture the distal femoral diaphysis just proximal to the distal metaphysis. Lateral displacement of the main distal fracture fragment, by half of the with of the bone. Fractures do not extend to the prosthetic femoral component itself. IMPRESSION: Acute comminuted transverse fracture of the distal femoral diaphysis just proximal to the distal metaphysis. Electronically Signed   By: Nelson Chimes M.D.   On: 12/11/2020 00:26    Review of Systems  HENT: Negative for ear discharge, ear pain, hearing loss and tinnitus.   Eyes: Negative for photophobia and pain.  Respiratory: Negative for cough and shortness of breath.   Cardiovascular: Negative for chest pain.  Gastrointestinal: Negative for abdominal pain, nausea and vomiting.  Genitourinary: Negative for dysuria, flank pain, frequency and urgency.  Musculoskeletal: Positive for arthralgias (Right knee). Negative for back pain, myalgias and neck pain.  Neurological: Negative for dizziness and headaches.  Hematological: Does not bruise/bleed easily.  Psychiatric/Behavioral: The patient is not nervous/anxious.    Blood pressure 112/66, pulse (!) 102, temperature 99.6 F (37.6 C), temperature source  Oral, resp. rate 14, SpO2 94 %. Physical Exam Constitutional:      General: She is not in acute distress.    Appearance: She is well-developed and well-nourished. She is not diaphoretic.  HENT:     Head: Normocephalic and atraumatic.  Eyes:     General: No scleral icterus.       Right eye: No discharge.        Left eye: No discharge.     Conjunctiva/sclera: Conjunctivae normal.  Cardiovascular:     Rate and Rhythm: Normal rate and regular rhythm.  Pulmonary:     Effort: Pulmonary effort is normal. No respiratory distress.  Musculoskeletal:     Cervical back: Normal range of motion.     Comments: RLE No traumatic wounds, ecchymosis, or rash  KI in place  No ankle effusion  Sens DPN, SPN, TN intact  Motor EHL, ext, flex, evers 5/5  DP 2+, PT 1+, No significant edema  Skin:    General: Skin is warm and dry.  Neurological:     Mental Status: She is alert.  Psychiatric:        Mood and Affect: Mood and affect normal.        Behavior: Behavior normal.     Assessment/Plan: Right distal femur fx -- Plan ORIF tomorrow with Dr. Doreatha Martin. Please keep NPO after MN. Multiple medical problems including type 2 diabetes on insulin, hypertension, Nash cirrhosis, and history of right knee replacement -- per primary service    Lisette Abu, PA-C Orthopedic Surgery [phone] 12/12/2020, 1:14 PM

## 2020-12-12 NOTE — Progress Notes (Signed)
PROGRESS NOTE    KATLEEN CARRAWAY  UEA:[id number] DOB: 29-Aug-1946 DOA: 12/10/2020 PCP: Adaline Sill, NP    Brief Narrative:  Patient with history of type 2 diabetes on insulin, hypertension, Karlene Lineman cirrhosis, history of right knee replacement presented to the hospital with mechanical fall, tripped down a curb.  Severe right pain.  Found to have periprosthetic right distal femoral fracture.  Admitted for operative intervention.   Assessment & Plan:   Principal Problem:   Closed fracture of right distal femur ([place]) Active Problems:   DM2 (diabetes mellitus, type 2) (HCC)   NASH (nonalcoholic steatohepatitis)   Obesity   Hypertension   Acute urinary retention  Closed periprosthetic right distal femoral fracture: With total knee in place Adequate pain medications along with laxatives SCD for DVT prophylaxis until surgery Follow orthopedics, planning for reconstructive surgery as per their schedule.  Type 2 diabetes: Adequate controlled on current dose of insulin.  Continue.  NASH cirrhosis: Stable.  Liver test normal.  Hypertension: Blood pressure stable on current regimen.  Urinary retention: Due to acute fracture, pain and immobility.  Foley catheter until mobilization after surgery.  Called and discussed with orthopedics provider, they are scheduling her for surgery tomorrow only.  Will allow to eat.   DVT prophylaxis: SCDs Start: 12/11/20 0440   Code Status: Full code Family Communication: None.  Patient is communicating. Disposition Plan: Status is: Inpatient  Remains inpatient appropriate because:Inpatient level of care appropriate due to severity of illness   Dispo: The patient is from: Home              Anticipated d/c is to: SNF              Anticipated d/c date is: 3 days              Patient currently is not medically stable to d/c.   Difficult to place patient No         Consultants:   Orthopedics  Procedures:   None  Antimicrobials:    None   Subjective: Patient seen and examined.  Severe pain right knee.  Disappointed with unable to have surgery today then we discussed that it is complicated procedure and needs planning. Overnight events noted, retained urine and needed Foley catheter.   Objective: Vitals:   12/11/20 2026 12/12/20 0535 12/12/20 0803 12/12/20 0816  BP: 119/66 122/63  112/66  Pulse: 94 93  (!) 102  Resp: 18 18  14   Temp: 98.5 F (36.9 C) 99.2 F (37.3 C)  99.6 F (37.6 C)  TempSrc: Oral Oral  Oral  SpO2: 100% 92% 96% 94%    Intake/Output Summary (Last 24 hours) at 12/12/2020 1140 Last data filed at 12/12/2020 0900 Gross per 24 hour  Intake 240 ml  Output 1076 ml  Net -836 ml   There were no vitals filed for this visit.  Examination:  General exam: Appears calm and comfortable  In moderate distress on mobility and anxious. Respiratory system: Clear to auscultation. Respiratory effort normal. Cardiovascular system: S1 & S2 heard, RRR. No JVD, murmurs, rubs, gallops or clicks. No pedal edema. Gastrointestinal system: Abdomen is nondistended, soft and nontender. No organomegaly or masses felt. Normal bowel sounds heard. Right knee with deformity, distal neurovascular status intact. Patient on knee immobilizer.    Data Reviewed: I have personally reviewed following labs and imaging studies  CBC: Recent Labs  Lab 12/11/20 0020  WBC 6.6  NEUTROABS 5.3  HGB 12.9  HCT 37.7  MCV 86.5  PLT 735   Basic Metabolic Panel: Recent Labs  Lab 12/11/20 0020  NA 138  K 3.7  CL 103  CO2 26  GLUCOSE 242*  BUN 16  CREATININE 0.78  CALCIUM 9.4   GFR: CrCl cannot be calculated (Unknown ideal weight.). Liver Function Tests: Recent Labs  Lab 12/11/20 1811  AST 36  ALT 33  ALKPHOS 125  BILITOT 0.7  PROT 7.9  ALBUMIN 3.6   No results for input(s): LIPASE, AMYLASE in the last 168 hours. No results for input(s): AMMONIA in the last 168 hours. Coagulation Profile: No results for  input(s): INR, PROTIME in the last 168 hours. Cardiac Enzymes: No results for input(s): CKTOTAL, CKMB, CKMBINDEX, TROPONINI in the last 168 hours. BNP (last 3 results) No results for input(s): PROBNP in the last 8760 hours. HbA1C: Recent Labs    12/11/20 0511  HGBA1C 8.6*   CBG: Recent Labs  Lab 12/11/20 1553 12/11/20 2028 12/12/20 0046 12/12/20 0530 12/12/20 1129  GLUCAP 110* 99 216* 207* 194*   Lipid Profile: No results for input(s): CHOL, HDL, LDLCALC, TRIG, CHOLHDL, LDLDIRECT in the last 72 hours. Thyroid Function Tests: No results for input(s): TSH, T4TOTAL, FREET4, T3FREE, THYROIDAB in the last 72 hours. Anemia Panel: No results for input(s): VITAMINB12, FOLATE, FERRITIN, TIBC, IRON, RETICCTPCT in the last 72 hours. Sepsis Labs: No results for input(s): PROCALCITON, LATICACIDVEN in the last 168 hours.  Recent Results (from the past 240 hour(s))  SARS CORONAVIRUS 2 (TAT 6-24 HRS) Nasopharyngeal Nasopharyngeal Swab     Status: None   Collection Time: 12/11/20  2:08 AM   Specimen: Nasopharyngeal Swab  Result Value Ref Range Status   SARS Coronavirus 2 NEGATIVE NEGATIVE Final    Comment: (NOTE) SARS-CoV-2 target nucleic acids are NOT DETECTED.  The SARS-CoV-2 RNA is generally detectable in upper and lower respiratory specimens during the acute phase of infection. Negative results do not preclude SARS-CoV-2 infection, do not rule out co-infections with other pathogens, and should not be used as the sole basis for treatment or other patient management decisions. Negative results must be combined with clinical observations, patient history, and epidemiological information. The expected result is Negative.  Fact Sheet for Patients: SugarRoll.be  Fact Sheet for Healthcare Providers: https://www.woods-mathews.com/  This test is not yet approved or cleared by the [place] FDA and  has been authorized for detection and/or  diagnosis of SARS-CoV-2 by FDA under an Emergency Use Authorization (EUA). This EUA will remain  in effect (meaning this test can be used) for the duration of the COVID-19 declaration under Se ction 564(b)(1) of the Act, 21 U.S.C. section 360bbb-3(b)(1), unless the authorization is terminated or revoked sooner.  Performed at [facility], [place] [address], [place], [place] [address]   Surgical pcr screen     Status: None   Collection Time: 12/11/20  6:14 PM   Specimen: Nasal Mucosa; Nasal Swab  Result Value Ref Range Status   MRSA, PCR NEGATIVE NEGATIVE Final   Staphylococcus aureus NEGATIVE NEGATIVE Final    Comment: (NOTE) The Xpert SA Assay (FDA approved for NASAL specimens in patients 88 years of age and older), is one component of a comprehensive surveillance program. It is not intended to diagnose infection nor to guide or monitor treatment. Performed at [facility], [place] [address], [place], [place] [address]          Radiology Studies: DG Lumbar Spine Complete  Result Date: 12/11/2020 CLINICAL DATA:  Fall  with back pain. EXAM: LUMBAR SPINE - COMPLETE 4+ VIEW COMPARISON:  10/08/2017 FINDINGS: No acute lumbar region finding. Five lumbar type vertebral bodies in normal alignment. Chronic disc space narrowing L2-3 through L4-5. Lower lumbar facet osteoarthritis. Cannot rule out a minimal superior endplate fracture at N46, but this area was not well evaluated on the lumbar study. Is the pain higher, in the lower thoracic region? IMPRESSION: 1. No acute lumbar region finding. Chronic disc space narrowing L2-3 through L4-5. Lower lumbar facet osteoarthritis. 2. Cannot rule out a minimal T11 superior endplate fracture. Is the patient's pain in the lower thoracic region? Electronically Signed   By: Nelson Chimes M.D.   On: 12/11/2020 00:28   CT Head Wo Contrast  Result Date: 12/10/2020 CLINICAL DATA:  Trauma to the head and neck. EXAM: CT HEAD WITHOUT CONTRAST CT CERVICAL  SPINE WITHOUT CONTRAST TECHNIQUE: Multidetector CT imaging of the head and cervical spine was performed following the standard protocol without intravenous contrast. Multiplanar CT image reconstructions of the cervical spine were also generated. COMPARISON:  12/06/2012.  03/16/2020. FINDINGS: CT HEAD FINDINGS Brain: Age related atrophy. Ordinary chronic small-vessel ischemic changes of the hemispheric white matter. No sign of acute infarction, mass lesion, hemorrhage, hydrocephalus or extra-axial collection. Vascular: There is atherosclerotic calcification of the major vessels at the base of the brain. Skull: Negative Sinuses/Orbits: Clear/normal Other: None CT CERVICAL SPINE FINDINGS Alignment: Normal Skull base and vertebrae: No fracture or primary bone lesion. Soft tissues and spinal canal: No significant soft tissue finding. Disc levels: Minimal mid cervical spondylosis. No significant narrowing of the canal or foramina identified. Mild facet osteoarthritis on the right at C2-3 and C3-4. Upper chest: Negative Other: None IMPRESSION: HEAD CT: No acute or traumatic finding. Age related atrophy and chronic small-vessel ischemic changes. CERVICAL SPINE CT: No acute or traumatic finding. Minimal spondylosis and facet osteoarthritis. Electronically Signed   By: Nelson Chimes M.D.   On: 12/10/2020 23:53   CT Cervical Spine Wo Contrast  Result Date: 12/10/2020 CLINICAL DATA:  Trauma to the head and neck. EXAM: CT HEAD WITHOUT CONTRAST CT CERVICAL SPINE WITHOUT CONTRAST TECHNIQUE: Multidetector CT imaging of the head and cervical spine was performed following the standard protocol without intravenous contrast. Multiplanar CT image reconstructions of the cervical spine were also generated. COMPARISON:  12/06/2012.  03/16/2020. FINDINGS: CT HEAD FINDINGS Brain: Age related atrophy. Ordinary chronic small-vessel ischemic changes of the hemispheric white matter. No sign of acute infarction, mass lesion, hemorrhage,  hydrocephalus or extra-axial collection. Vascular: There is atherosclerotic calcification of the major vessels at the base of the brain. Skull: Negative Sinuses/Orbits: Clear/normal Other: None CT CERVICAL SPINE FINDINGS Alignment: Normal Skull base and vertebrae: No fracture or primary bone lesion. Soft tissues and spinal canal: No significant soft tissue finding. Disc levels: Minimal mid cervical spondylosis. No significant narrowing of the canal or foramina identified. Mild facet osteoarthritis on the right at C2-3 and C3-4. Upper chest: Negative Other: None IMPRESSION: HEAD CT: No acute or traumatic finding. Age related atrophy and chronic small-vessel ischemic changes. CERVICAL SPINE CT: No acute or traumatic finding. Minimal spondylosis and facet osteoarthritis. Electronically Signed   By: Nelson Chimes M.D.   On: 12/10/2020 23:53   DG Chest Port 1 View  Result Date: 12/11/2020 CLINICAL DATA:  Preoperative respiratory exam. EXAM: PORTABLE CHEST 1 VIEW COMPARISON:  April 19, 2020 FINDINGS: The heart size and mediastinal contours are within normal limits. Both lungs are clear. The visualized skeletal structures are unremarkable. IMPRESSION: No  active cardiopulmonary disease. Electronically Signed   By: Virgina Norfolk M.D.   On: 12/11/2020 02:32   DG Femur Min 2 Views Right  Result Date: 12/11/2020 CLINICAL DATA:  Fall with pain EXAM: RIGHT FEMUR 2 VIEWS COMPARISON:  04/19/2020 FINDINGS: Right hip and proximal femur appear unremarkable. Acute comminuted transverse fracture the distal femoral diaphysis just proximal to the distal metaphysis. Lateral displacement of the main distal fracture fragment, by half of the with of the bone. Fractures do not extend to the prosthetic femoral component itself. IMPRESSION: Acute comminuted transverse fracture of the distal femoral diaphysis just proximal to the distal metaphysis. Electronically Signed   By: Nelson Chimes M.D.   On: 12/11/2020 00:26         Scheduled Meds: . Chlorhexidine Gluconate Cloth  6 each Topical Daily  . fluticasone  1 spray Each Nare Daily  . insulin aspart  0-9 Units Subcutaneous Q4H  . insulin glargine  21 Units Subcutaneous Q1400  . mometasone-formoterol  2 puff Inhalation BID  . montelukast  10 mg Oral QHS  . pantoprazole  40 mg Oral Daily  . pregabalin  50 mg Oral BID  . vortioxetine HBr  10 mg Oral Daily   Continuous Infusions:   LOS: 1 day    Time spent: 30 minutes    Barb Merino, MD [facility] Pager [phone]

## 2020-12-12 NOTE — Progress Notes (Signed)
     Subjective:      Patient reports pain as severe with any movement of the right leg.  I have discussed that the plan is for Dr. Doreatha Martin to preform surgery on the patient tomorrow.  Will confirm, but she should be able to eat today and be NPO after midnight.     Objective:   VITALS:   Vitals:   12/11/20 2026 12/12/20 0535  BP: 119/66 122/63  Pulse: 94 93  Resp: 18 18  Temp: 98.5 F (36.9 C) 99.2 F (37.3 C)  SpO2: 100% 92%    Neurovascular intact Dorsiflexion/Plantar flexion intact No cellulitis present Compartment soft  LABS Recent Labs    12/11/20 0020  HGB 12.9  HCT 37.7  WBC 6.6  PLT 154    Recent Labs    12/11/20 0020  NA 138  K 3.7  BUN 16  CREATININE 0.78  GLUCOSE 242*     Assessment/Plan:      Regular diet NPO after MN Plan for Dr. Doreatha Martin to ORIF the distal right femur fracture        Danae Orleans PA-C  [facility]  [place] [address], [place], [place] [address] Phone: [phone] [email or web address] Facebook  [employer]

## 2020-12-12 NOTE — Progress Notes (Signed)
Initial Nutrition Assessment  DOCUMENTATION CODES:   Not applicable  INTERVENTION:   -MVI with minerals daily -Glucerna Shake po TID, each supplement provides 220 kcal and 10 grams of protein  NUTRITION DIAGNOSIS:   Increased nutrient needs related to post-op healing as evidenced by estimated needs.  GOAL:   Patient will meet greater than or equal to 90% of their needs  MONITOR:   PO intake,Supplement acceptance,Labs,Weight trends,Skin,I & O's  REASON FOR ASSESSMENT:   Consult Assessment of nutrition requirement/status,Hip fracture protocol  ASSESSMENT:   Paula Steele is a 75 y.o. female with medical history significant of DM2, HTN, NASH.  Pt admitted with rt distal femur fracture s/p fall.   Reviewed I/O's: -835 ml x 24 hours   UOP: 1.1 L x 24 hours    Per orthopedics notes, surgery date to be determined.   Pt unavailable at time of visit. Unable to obtain further nutrition-related history or complete nutrition-focused physical exam at this time.   Pt with good appetite. Noted meal completion 75%.   No new weight since 03/2020. Noted pt with distant history of weight loss.   Pt with increased nutritional needs and would benefit from addition of oral nutrition supplements.   Lab Results  Component Value Date   HGBA1C 8.6 (H) 12/11/2020   PTA DM medications are 42 units tresiba daily.   Labs reviewed: CBGS: 99-229 (inpatient orders for glycemic control are 0-9 units insulin aspart every 4 hours and 21 units insulin glargine daily).   Diet Order:   Diet Order            Diet Carb Modified Fluid consistency: Thin; Room service appropriate? Yes  Diet effective now                 EDUCATION NEEDS:   No education needs have been identified at this time  Skin:  Skin Assessment: Reviewed RN Assessment  Last BM:  12/12/20  Height:   Ht Readings from Last 1 Encounters:  04/19/20 5\' 8"  (1.727 m)    Weight:   Wt Readings from Last 1 Encounters:   04/19/20 99.3 kg    Ideal Body Weight:  63.6 kg  BMI:  There is no height or weight on file to calculate BMI.  Estimated Nutritional Needs:   Kcal:  1900-2100  Protein:  95-110 grams  Fluid:  > 1.9 L    Loistine Chance, RD, LDN, [place] Registered Dietitian II Certified Diabetes Care and Education Specialist Please refer to [facility] for RD and/or RD on-call/weekend/after hours pager

## 2020-12-12 NOTE — H&P (View-Only) (Signed)
Reason for Consult:Right distal femur fx Referring Physician: Salli Steele Time called: 1206 Time at bedside: 1259   [name] is an 75 y.o. female.  HPI: Paula Steele was leaving the grocery store with her daughter when Paula Steele lost her balance going down a small grade and fell forwards. Paula Steele put her hands out to brace her fall but hit on her knees hard. Paula Steele had immediate pain in the right knee and could not get up. Paula Steele was brought to the ED where x-rays showed a distal femur fx and orthopedic surgery was consulted. Due to the periprosthetic nature of the fracture orthopedic trauma consultation was requested. Paula Steele lives at home and only uses a cane to ambulate if Paula Steele's going to be on uneven ground.  Past Medical History:  Diagnosis Date  . Arthritis     SHOULDERS--   IS CURRENTLY HAS LEFT SHOULDER PAIN  . Benign neoplasm of adrenal gland   . Benign neoplasm of colon   . Blood transfusion   . Bronchitis    10/2012- followed by ER and then PCP- Dr Melina Copa   . Chronic kidney disease    bladder incontinence   . Chronic meniscal tear of knee, right   . Colon polyp   . Degeneration of intervertebral disc, site unspecified    LUMBAR  . Diabetes mellitus without mention of complication    INSULIN AND ORAL MEDS  . Diverticulosis of colon (without mention of hemorrhage)   . Esophageal reflux    CONTROLLED W/ PRILOSEC  . Femur fracture, right ([place]) 12/10/2020   closed intial encouter  . History of neck pain    chronic left sided neck pain  . Hx: UTI (urinary tract infection)   . Hyperlipidemia   . Hypertension   . Insomnia DUE TO STRESS  . Left arm pain    and  heaviness since radical neck surg  . Neck pain, chronic S/P RADIAL NECK DISSECTION--  2007   LEFT SIDE OF NECK AND SHOULDER PAIN  . Oropharyngeal dysphagia MILD RIGHT SUPRAGLOTTIC REGION   PT STATES Paula Steele EATS VERY SLOWLY AND CHEWS FOOD WELL  . Other chronic nonalcoholic liver disease    SEES DR Deatra Ina-- ?SCARRING OR CIRRHOSIS PER PET SCAN  2010  . Personal history of other diseases of digestive system    OCCASIONAL ABD. PAIN  . 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 Shortness of breath   . Shortness of breath on exertion   . 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 Urgency incontinence   . Vitamin D deficiency   . Weakness of both legs     Past Surgical History:  Procedure Laterality Date  . ABDOMINAL HYSTERECTOMY  1984   W/  LSO  . CATARACT EXTRACTION W/ INTRAOCULAR LENS  IMPLANT, BILATERAL    . CHOLECYSTECTOMY  15 YRS AGO  . DIRECT LARYNGOSCOPY  2007   W/ LEFT NECK MASS EXCISION  . EXCISION LEFT SEBACOUS CYST  05-14-2011 W/  LOCAL  . KNEE ARTHROSCOPY  09/26/2011   Procedure: ARTHROSCOPY KNEE;  Surgeon: Gearlean Alf;  Location: [facility];  Service: Orthopedics;  Laterality: Right;  right knee arthroscopy with meniscal tear  . KNEE ARTHROSCOPY  03/25/2012   Procedure: ARTHROSCOPY KNEE;  Surgeon: Gearlean Alf, MD;  Location: [facility];  Service: Orthopedics;  Laterality: Right;  With Debridement of medial lateral meniscus  . LAPAROSCOPIC SALPINGOOPHERECTOMY  2001   RIGHT  W/ LYSIS AHESIONS  . NECK MASS EXCISION  2007   LEFT  . RADICAL NECK DISSECTION  2007   W/ LEFT TONSILLECTOMY AND LEFT EXCISION LEFT TONGUE BASE DUE TO CANCER  . SHOULDER SURGERY  1990   LEFT  . TONSILLECTOMY  01-31-2009   RIGHT (DUE TO MASS)  . TONSILLECTOMY  2007   LEFT W/ NECK DISSECTION DUE TO CANCER  . TOTAL KNEE ARTHROPLASTY Right 11/30/2012   Procedure: TOTAL KNEE ARTHROPLASTY;  Surgeon: Gearlean Alf, MD;  Location: [facility];  Service: Orthopedics;  Laterality: Right;    Family History  Problem Relation Age of Onset  . Thyroid cancer Brother   . Diabetes Mother   . Diabetes Brother   . Heart disease Father   . Heart disease Brother     Social History:  reports that Paula Steele quit smoking  about 36 years ago. Her smoking use included cigarettes. Paula Steele quit after 15.00 years of use. Paula Steele has never used smokeless tobacco. Paula Steele reports that Paula Steele does not drink alcohol and does not use drugs.  Allergies:  Allergies  Allergen Reactions  . Actos [Pioglitazone]     edema  . Bydureon [Exenatide]     nausea  . Metformin And Related     diahrrhea    Medications: I have reviewed the patient's current medications.  Results for orders placed or performed during the hospital encounter of 12/10/20 (from the past 48 hour(s))  Basic metabolic panel     Status: Abnormal   Collection Time: 12/11/20 12:20 AM  Result Value Ref Range   Sodium 138 135 - 145 mmol/L   Potassium 3.7 3.5 - 5.1 mmol/L   Chloride 103 98 - 111 mmol/L   CO2 26 22 - 32 mmol/L   Glucose, Bld 242 (H) 70 - 99 mg/dL    Comment: Glucose reference range applies only to samples taken after fasting for at least 8 hours.   BUN 16 8 - 23 mg/dL   Creatinine, Ser 0.78 0.44 - 1.00 mg/dL   Calcium 9.4 8.9 - 10.3 mg/dL   GFR, Estimated >60 >60 mL/min    Comment: (NOTE) Calculated using the CKD-EPI Creatinine Equation (2021)    Anion gap 9 5 - 15    Comment: Performed at [facility] [address], [place], [place] [address]  CBC with Differential     Status: None   Collection Time: 12/11/20 12:20 AM  Result Value Ref Range   WBC 6.6 4.0 - 10.5 K/uL   RBC 4.36 3.87 - 5.11 MIL/uL   Hemoglobin 12.9 12.0 - 15.0 g/dL   HCT 37.7 36.0 - 46.0 %   MCV 86.5 80.0 - 100.0 fL   MCH 29.6 26.0 - 34.0 pg   MCHC 34.2 30.0 - 36.0 g/dL   RDW 14.4 11.5 - 15.5 %   Platelets 154 150 - 400 K/uL   nRBC 0.0 0.0 - 0.2 %   Neutrophils Relative % 80 %   Neutro Abs 5.3 1.7 - 7.7 K/uL   Lymphocytes Relative 15 %   Lymphs Abs 1.0 0.7 - 4.0 K/uL   Monocytes Relative 3 %   Monocytes Absolute 0.2 0.1 - 1.0 K/uL   Eosinophils Relative 1 %   Eosinophils Absolute 0.0 0.0 - 0.5 K/uL   Basophils Relative 1 %   Basophils Absolute 0.0 0.0 - 0.1  K/uL   Immature Granulocytes 0 %  Abs Immature Granulocytes 0.02 0.00 - 0.07 K/uL    Comment: Performed at [facility], [place] [address], [place], [place] [address]  SARS CORONAVIRUS 2 (TAT 6-24 HRS) Nasopharyngeal Nasopharyngeal Swab     Status: None   Collection Time: 12/11/20  2:08 AM   Specimen: Nasopharyngeal Swab  Result Value Ref Range   SARS Coronavirus 2 NEGATIVE NEGATIVE    Comment: (NOTE) SARS-CoV-2 target nucleic acids are NOT DETECTED.  The SARS-CoV-2 RNA is generally detectable in upper and lower respiratory specimens during the acute phase of infection. Negative results do not preclude SARS-CoV-2 infection, do not rule out co-infections with other pathogens, and should not be used as the sole basis for treatment or other patient management decisions. Negative results must be combined with clinical observations, patient history, and epidemiological information. The expected result is Negative.  Fact Sheet for Patients: SugarRoll.be  Fact Sheet for Healthcare Providers: https://www.woods-mathews.com/  This test is not yet approved or cleared by the [place] FDA and  has been authorized for detection and/or diagnosis of SARS-CoV-2 by FDA under an Emergency Use Authorization (EUA). This EUA will remain  in effect (meaning this test can be used) for the duration of the COVID-19 declaration under Se ction 564(b)(1) of the Act, 21 U.S.C. section 360bbb-3(b)(1), unless the authorization is terminated or revoked sooner.  Performed at [facility], [place] [address] [place], [place] [address]   Hemoglobin A1c     Status: Abnormal   Collection Time: 12/11/20  5:11 AM  Result Value Ref Range   Hgb A1c MFr Bld 8.6 (H) 4.8 - 5.6 %    Comment: (NOTE) Pre diabetes:          5.7%-6.4%  Diabetes:              >6.4%  Glycemic control for   <7.0% adults with diabetes    Mean Plasma Glucose 200.12 mg/dL    Comment:  Performed at [facility] [address], [place], [place] [address]  CBG monitoring, ED     Status: Abnormal   Collection Time: 12/11/20  6:16 AM  Result Value Ref Range   Glucose-Capillary 181 (H) 70 - 99 mg/dL    Comment: Glucose reference range applies only to samples taken after fasting for at least 8 hours.  CBG monitoring, ED     Status: Abnormal   Collection Time: 12/11/20  7:51 AM  Result Value Ref Range   Glucose-Capillary 169 (H) 70 - 99 mg/dL    Comment: Glucose reference range applies only to samples taken after fasting for at least 8 hours.  Urinalysis, Routine w reflex microscopic Urine, Catheterized     Status: Abnormal   Collection Time: 12/11/20  8:51 AM  Result Value Ref Range   Color, Urine YELLOW YELLOW   APPearance CLEAR CLEAR   Specific Gravity, Urine 1.027 1.005 - 1.030   pH 6.0 5.0 - 8.0   Glucose, UA >=500 (A) NEGATIVE mg/dL   Hgb urine dipstick NEGATIVE NEGATIVE   Bilirubin Urine NEGATIVE NEGATIVE   Ketones, ur NEGATIVE NEGATIVE mg/dL   Protein, ur NEGATIVE NEGATIVE mg/dL   Nitrite NEGATIVE NEGATIVE   Leukocytes,Ua NEGATIVE NEGATIVE   RBC / HPF 0-5 0 - 5 RBC/hpf   WBC, UA 0-5 0 - 5 WBC/hpf   Bacteria, UA NONE SEEN NONE SEEN   Squamous Epithelial / LPF 0-5 0 - 5   Mucus PRESENT    Non Squamous Epithelial 0-5 (A) NONE  SEEN    Comment: Performed at [facility], [place] [address] [place], [place] [address]  CBG monitoring, ED     Status: Abnormal   Collection Time: 12/11/20 11:13 AM  Result Value Ref Range   Glucose-Capillary 114 (H) 70 - 99 mg/dL    Comment: Glucose reference range applies only to samples taken after fasting for at least 8 hours.  CBG monitoring, ED     Status: Abnormal   Collection Time: 12/11/20  3:53 PM  Result Value Ref Range   Glucose-Capillary 110 (H) 70 - 99 mg/dL    Comment: Glucose reference range applies only to samples taken after fasting for at least 8 hours.  Hepatic function panel     Status: None    Collection Time: 12/11/20  6:11 PM  Result Value Ref Range   Total Protein 7.9 6.5 - 8.1 g/dL   Albumin 3.6 3.5 - 5.0 g/dL   AST 36 15 - 41 U/L   ALT 33 0 - 44 U/L   Alkaline Phosphatase 125 38 - 126 U/L   Total Bilirubin 0.7 0.3 - 1.2 mg/dL   Bilirubin, Direct 0.2 0.0 - 0.2 mg/dL   Indirect Bilirubin 0.5 0.3 - 0.9 mg/dL    Comment: Performed at [facility] [address], [place],  [address]  Surgical pcr screen     Status: None   Collection Time: 12/11/20  6:14 PM   Specimen: Nasal Mucosa; Nasal Swab  Result Value Ref Range   MRSA, PCR NEGATIVE NEGATIVE   Staphylococcus aureus NEGATIVE NEGATIVE    Comment: (NOTE) The Xpert SA Assay (FDA approved for NASAL specimens in patients 37 years of age and older), is one component of a comprehensive surveillance program. It is not intended to diagnose infection nor to guide or monitor treatment. Performed at [facility], [place] [address] [place], [place] [address]   Glucose, capillary     Status: None   Collection Time: 12/11/20  8:28 PM  Result Value Ref Range   Glucose-Capillary 99 70 - 99 mg/dL    Comment: Glucose reference range applies only to samples taken after fasting for at least 8 hours.  Glucose, capillary     Status: Abnormal   Collection Time: 12/12/20 12:46 AM  Result Value Ref Range   Glucose-Capillary 216 (H) 70 - 99 mg/dL    Comment: Glucose reference range applies only to samples taken after fasting for at least 8 hours.  Glucose, capillary     Status: Abnormal   Collection Time: 12/12/20  5:30 AM  Result Value Ref Range   Glucose-Capillary 207 (H) 70 - 99 mg/dL    Comment: Glucose reference range applies only to samples taken after fasting for at least 8 hours.  Glucose, capillary     Status: Abnormal   Collection Time: 12/12/20  9:27 AM  Result Value Ref Range   Glucose-Capillary 229 (H) 70 - 99 mg/dL    Comment: Glucose reference range applies only to samples taken after fasting for at  least 8 hours.  Glucose, capillary     Status: Abnormal   Collection Time: 12/12/20 11:29 AM  Result Value Ref Range   Glucose-Capillary 194 (H) 70 - 99 mg/dL    Comment: Glucose reference range applies only to samples taken after fasting for at least 8 hours.    DG Lumbar Spine Complete  Result Date: 12/11/2020 CLINICAL DATA:  Fall with back pain. EXAM: LUMBAR SPINE - COMPLETE 4+  VIEW COMPARISON:  10/08/2017 FINDINGS: No acute lumbar region finding. Five lumbar type vertebral bodies in normal alignment. Chronic disc space narrowing L2-3 through L4-5. Lower lumbar facet osteoarthritis. Cannot rule out a minimal superior endplate fracture at O35, but this area was not well evaluated on the lumbar study. Is the pain higher, in the lower thoracic region? IMPRESSION: 1. No acute lumbar region finding. Chronic disc space narrowing L2-3 through L4-5. Lower lumbar facet osteoarthritis. 2. Cannot rule out a minimal T11 superior endplate fracture. Is the patient's pain in the lower thoracic region? Electronically Signed   By: Nelson Chimes M.D.   On: 12/11/2020 00:28   CT Head Wo Contrast  Result Date: 12/10/2020 CLINICAL DATA:  Trauma to the head and neck. EXAM: CT HEAD WITHOUT CONTRAST CT CERVICAL SPINE WITHOUT CONTRAST TECHNIQUE: Multidetector CT imaging of the head and cervical spine was performed following the standard protocol without intravenous contrast. Multiplanar CT image reconstructions of the cervical spine were also generated. COMPARISON:  12/06/2012.  03/16/2020. FINDINGS: CT HEAD FINDINGS Brain: Age related atrophy. Ordinary chronic small-vessel ischemic changes of the hemispheric white matter. No sign of acute infarction, mass lesion, hemorrhage, hydrocephalus or extra-axial collection. Vascular: There is atherosclerotic calcification of the major vessels at the base of the brain. Skull: Negative Sinuses/Orbits: Clear/normal Other: None CT CERVICAL SPINE FINDINGS Alignment: Normal Skull base and  vertebrae: No fracture or primary bone lesion. Soft tissues and spinal canal: No significant soft tissue finding. Disc levels: Minimal mid cervical spondylosis. No significant narrowing of the canal or foramina identified. Mild facet osteoarthritis on the right at C2-3 and C3-4. Upper chest: Negative Other: None IMPRESSION: HEAD CT: No acute or traumatic finding. Age related atrophy and chronic small-vessel ischemic changes. CERVICAL SPINE CT: No acute or traumatic finding. Minimal spondylosis and facet osteoarthritis. Electronically Signed   By: Nelson Chimes M.D.   On: 12/10/2020 23:53   CT Cervical Spine Wo Contrast  Result Date: 12/10/2020 CLINICAL DATA:  Trauma to the head and neck. EXAM: CT HEAD WITHOUT CONTRAST CT CERVICAL SPINE WITHOUT CONTRAST TECHNIQUE: Multidetector CT imaging of the head and cervical spine was performed following the standard protocol without intravenous contrast. Multiplanar CT image reconstructions of the cervical spine were also generated. COMPARISON:  12/06/2012.  03/16/2020. FINDINGS: CT HEAD FINDINGS Brain: Age related atrophy. Ordinary chronic small-vessel ischemic changes of the hemispheric white matter. No sign of acute infarction, mass lesion, hemorrhage, hydrocephalus or extra-axial collection. Vascular: There is atherosclerotic calcification of the major vessels at the base of the brain. Skull: Negative Sinuses/Orbits: Clear/normal Other: None CT CERVICAL SPINE FINDINGS Alignment: Normal Skull base and vertebrae: No fracture or primary bone lesion. Soft tissues and spinal canal: No significant soft tissue finding. Disc levels: Minimal mid cervical spondylosis. No significant narrowing of the canal or foramina identified. Mild facet osteoarthritis on the right at C2-3 and C3-4. Upper chest: Negative Other: None IMPRESSION: HEAD CT: No acute or traumatic finding. Age related atrophy and chronic small-vessel ischemic changes. CERVICAL SPINE CT: No acute or traumatic finding.  Minimal spondylosis and facet osteoarthritis. Electronically Signed   By: Nelson Chimes M.D.   On: 12/10/2020 23:53   DG Chest Port 1 View  Result Date: 12/11/2020 CLINICAL DATA:  Preoperative respiratory exam. EXAM: PORTABLE CHEST 1 VIEW COMPARISON:  April 19, 2020 FINDINGS: The heart size and mediastinal contours are within normal limits. Both lungs are clear. The visualized skeletal structures are unremarkable. IMPRESSION: No active cardiopulmonary disease. Electronically Signed   By: Hoover Browns  Houston M.D.   On: 12/11/2020 02:32   DG Femur Min 2 Views Right  Result Date: 12/11/2020 CLINICAL DATA:  Fall with pain EXAM: RIGHT FEMUR 2 VIEWS COMPARISON:  04/19/2020 FINDINGS: Right hip and proximal femur appear unremarkable. Acute comminuted transverse fracture the distal femoral diaphysis just proximal to the distal metaphysis. Lateral displacement of the main distal fracture fragment, by half of the with of the bone. Fractures do not extend to the prosthetic femoral component itself. IMPRESSION: Acute comminuted transverse fracture of the distal femoral diaphysis just proximal to the distal metaphysis. Electronically Signed   By: Nelson Chimes M.D.   On: 12/11/2020 00:26    Review of Systems  HENT: Negative for ear discharge, ear pain, hearing loss and tinnitus.   Eyes: Negative for photophobia and pain.  Respiratory: Negative for cough and shortness of breath.   Cardiovascular: Negative for chest pain.  Gastrointestinal: Negative for abdominal pain, nausea and vomiting.  Genitourinary: Negative for dysuria, flank pain, frequency and urgency.  Musculoskeletal: Positive for arthralgias (Right knee). Negative for back pain, myalgias and neck pain.  Neurological: Negative for dizziness and headaches.  Hematological: Does not bruise/bleed easily.  Psychiatric/Behavioral: The patient is not nervous/anxious.    Blood pressure 112/66, pulse (!) 102, temperature 99.6 F (37.6 C), temperature source  Oral, resp. rate 14, SpO2 94 %. Physical Exam Constitutional:      General: Paula Steele is not in acute distress.    Appearance: Paula Steele is well-developed and well-nourished. Paula Steele is not diaphoretic.  HENT:     Head: Normocephalic and atraumatic.  Eyes:     General: No scleral icterus.       Right eye: No discharge.        Left eye: No discharge.     Conjunctiva/sclera: Conjunctivae normal.  Cardiovascular:     Rate and Rhythm: Normal rate and regular rhythm.  Pulmonary:     Effort: Pulmonary effort is normal. No respiratory distress.  Musculoskeletal:     Cervical back: Normal range of motion.     Comments: RLE No traumatic wounds, ecchymosis, or rash  KI in place  No ankle effusion  Sens DPN, SPN, TN intact  Motor EHL, ext, flex, evers 5/5  DP 2+, PT 1+, No significant edema  Skin:    General: Skin is warm and dry.  Neurological:     Mental Status: Paula Steele is alert.  Psychiatric:        Mood and Affect: Mood and affect normal.        Behavior: Behavior normal.     Assessment/Plan: Right distal femur fx -- Plan ORIF tomorrow with Dr. Doreatha Martin. Please keep NPO after MN. Multiple medical problems including type 2 diabetes on insulin, hypertension, Nash cirrhosis, and history of right knee replacement -- per primary service    Lisette Abu, PA-C Orthopedic Surgery [phone] 12/12/2020, 1:14 PM

## 2020-12-12 NOTE — Plan of Care (Signed)

## 2020-12-13 ENCOUNTER — Inpatient Hospital Stay (HOSPITAL_COMMUNITY): Payer: Medicare Other | Admitting: Certified Registered"

## 2020-12-13 ENCOUNTER — Inpatient Hospital Stay (HOSPITAL_COMMUNITY): Payer: Medicare Other

## 2020-12-13 ENCOUNTER — Encounter (HOSPITAL_COMMUNITY)
Admission: EM | Disposition: A | Discharge status: Skilled Nursing Facility | Payer: Self-pay | Source: Home / Self Care | Attending: Internal Medicine

## 2020-12-13 ENCOUNTER — Encounter (HOSPITAL_COMMUNITY): Payer: Self-pay | Admitting: Internal Medicine

## 2020-12-13 DIAGNOSIS — E119 Type 2 diabetes mellitus without complications: Secondary | ICD-10-CM | POA: Diagnosis not present

## 2020-12-13 DIAGNOSIS — S72401A Unspecified fracture of lower end of right femur, initial encounter for closed fracture: Secondary | ICD-10-CM | POA: Diagnosis not present

## 2020-12-13 DIAGNOSIS — R338 Other retention of urine: Secondary | ICD-10-CM | POA: Diagnosis not present

## 2020-12-13 DIAGNOSIS — I10 Essential (primary) hypertension: Secondary | ICD-10-CM | POA: Diagnosis not present

## 2020-12-13 HISTORY — PX: OTHER SURGICAL HISTORY: SHX169

## 2020-12-13 HISTORY — PX: ORIF FEMUR FRACTURE: SHX2119

## 2020-12-13 LAB — CREATININE, SERUM
Creatinine, Ser: 0.6 mg/dL (ref 0.44–1.00)
GFR, Estimated: >60 mL/min (ref >60)

## 2020-12-13 LAB — CBC
HCT: 32.1 % — ABNORMAL LOW (ref 36.0–46.0)
Hemoglobin: 10.9 g/dL — ABNORMAL LOW (ref 12.0–15.0)
MCH: 29.2 pg (ref 26.0–34.0)
MCHC: 34 g/dL (ref 30.0–36.0)
MCV: 86.1 fL (ref 80.0–100.0)
Platelets: 142 10*3/uL — ABNORMAL LOW (ref 150–400)
RBC: 3.73 MIL/uL — ABNORMAL LOW (ref 3.87–5.11)
RDW: 14.4 % (ref 11.5–15.5)
WBC: 7.9 10*3/uL (ref 4.0–10.5)
nRBC: 0 % (ref 0.0–0.2)

## 2020-12-13 LAB — GLUCOSE, CAPILLARY
Glucose-Capillary: 180 mg/dL — ABNORMAL HIGH (ref 70–99)
Glucose-Capillary: 216 mg/dL — ABNORMAL HIGH (ref 70–99)
Glucose-Capillary: 178 mg/dL — ABNORMAL HIGH (ref 70–99)
Glucose-Capillary: 215 mg/dL — ABNORMAL HIGH (ref 70–99)
Glucose-Capillary: 399 mg/dL — ABNORMAL HIGH (ref 70–99)
Glucose-Capillary: 357 mg/dL — ABNORMAL HIGH (ref 70–99)

## 2020-12-13 SURGERY — OPEN REDUCTION INTERNAL FIXATION (ORIF) DISTAL FEMUR FRACTURE
Anesthesia: General | Laterality: Right

## 2020-12-13 MED ORDER — CHLORHEXIDINE GLUCONATE 0.12 % MT SOLN: 15.0000 mL | Freq: Once | OROMUCOSAL | Status: AC

## 2020-12-13 MED ORDER — POVIDONE-IODINE 10 % EX SWAB: 2.0000 "application " | Freq: Once | CUTANEOUS | Status: DC

## 2020-12-13 MED ORDER — ACETAMINOPHEN 10 MG/ML IV SOLN: 1000.0000 mg | Freq: Once | INTRAVENOUS | Status: DC | PRN

## 2020-12-13 MED ORDER — HYDROMORPHONE HCL 1 MG/ML IJ SOLN
0.5000 mg | INTRAMUSCULAR | Status: DC | PRN
  Administered 2020-12-13 – 2020-12-18 (×19): 1 mg via INTRAVENOUS
  Filled 2020-12-13 (×19): qty 1

## 2020-12-13 MED ORDER — CEFAZOLIN SODIUM-DEXTROSE 2-4 GM/100ML-% IV SOLN
2.0000 g | Freq: Three times a day (TID) | INTRAVENOUS | Status: AC
  Administered 2020-12-13 – 2020-12-14 (×3): 2 g via INTRAVENOUS
  Filled 2020-12-13 (×3): qty 100

## 2020-12-13 MED ORDER — CEFAZOLIN SODIUM-DEXTROSE 2-4 GM/100ML-% IV SOLN
2.0000 g | INTRAVENOUS | Status: AC
  Administered 2020-12-13: 2 g via INTRAVENOUS

## 2020-12-13 MED ORDER — DOCUSATE SODIUM 100 MG PO CAPS
100.0000 mg | ORAL_CAPSULE | Freq: Two times a day (BID) | ORAL | Status: DC
  Administered 2020-12-13 – 2020-12-17 (×9): 100 mg via ORAL
  Filled 2020-12-13 (×10): qty 1

## 2020-12-13 MED ORDER — ROCURONIUM BROMIDE 10 MG/ML (PF) SYRINGE
PREFILLED_SYRINGE | INTRAVENOUS | Status: DC | PRN
  Administered 2020-12-13: 60 mg via INTRAVENOUS

## 2020-12-13 MED ORDER — 0.9 % SODIUM CHLORIDE (POUR BTL) OPTIME
TOPICAL | Status: DC | PRN
  Administered 2020-12-13: 1000 mL

## 2020-12-13 MED ORDER — HYDROMORPHONE HCL 1 MG/ML IJ SOLN
INTRAMUSCULAR | Status: AC
  Administered 2020-12-13: 0.5 mg via INTRAVENOUS
  Filled 2020-12-13: qty 1

## 2020-12-13 MED ORDER — POTASSIUM CHLORIDE IN NACL 20-0.9 MEQ/L-% IV SOLN
INTRAVENOUS | Status: DC
  Filled 2020-12-13: qty 1000

## 2020-12-13 MED ORDER — ONDANSETRON HCL 4 MG PO TABS
4.0000 mg | ORAL_TABLET | Freq: Four times a day (QID) | ORAL | Status: DC | PRN
  Administered 2020-12-17: 4 mg via ORAL
  Filled 2020-12-13: qty 1

## 2020-12-13 MED ORDER — DIAZEPAM 5 MG PO TABS
ORAL_TABLET | ORAL | Status: AC
  Filled 2020-12-13: qty 1

## 2020-12-13 MED ORDER — VANCOMYCIN HCL 1000 MG IV SOLR
INTRAVENOUS | Status: DC | PRN
  Administered 2020-12-13: 1000 mg

## 2020-12-13 MED ORDER — FENTANYL CITRATE (PF) 250 MCG/5ML IJ SOLN
INTRAMUSCULAR | Status: DC | PRN
  Administered 2020-12-13 (×5): 50 ug via INTRAVENOUS

## 2020-12-13 MED ORDER — POLYETHYLENE GLYCOL 3350 17 G PO PACK: 17.0000 g | PACK | Freq: Every day | ORAL | Status: DC | PRN

## 2020-12-13 MED ORDER — SUGAMMADEX SODIUM 200 MG/2ML IV SOLN
INTRAVENOUS | Status: DC | PRN
  Administered 2020-12-13: 200 mg via INTRAVENOUS

## 2020-12-13 MED ORDER — PROPOFOL 10 MG/ML IV BOLUS
INTRAVENOUS | Status: AC
  Filled 2020-12-13: qty 20

## 2020-12-13 MED ORDER — ACETAMINOPHEN 325 MG PO TABS
325.0000 mg | ORAL_TABLET | Freq: Four times a day (QID) | ORAL | Status: DC | PRN
  Administered 2020-12-14: 650 mg via ORAL
  Filled 2020-12-13: qty 2

## 2020-12-13 MED ORDER — CEFAZOLIN SODIUM-DEXTROSE 2-4 GM/100ML-% IV SOLN
INTRAVENOUS | Status: AC
  Filled 2020-12-13: qty 100

## 2020-12-13 MED ORDER — METOCLOPRAMIDE HCL 5 MG/ML IJ SOLN: 5.0000 mg | Freq: Three times a day (TID) | INTRAMUSCULAR | Status: DC | PRN

## 2020-12-13 MED ORDER — CHLORHEXIDINE GLUCONATE 0.12 % MT SOLN
OROMUCOSAL | Status: AC
  Administered 2020-12-13: 15 mL
  Filled 2020-12-13: qty 15

## 2020-12-13 MED ORDER — OXYCODONE-ACETAMINOPHEN 5-325 MG PO TABS
2.0000 | ORAL_TABLET | Freq: Four times a day (QID) | ORAL | Status: DC | PRN
  Administered 2020-12-13 – 2020-12-14 (×3): 2 via ORAL
  Filled 2020-12-13 (×3): qty 2

## 2020-12-13 MED ORDER — LACTATED RINGERS IV SOLN: INTRAVENOUS | Status: DC

## 2020-12-13 MED ORDER — ENOXAPARIN SODIUM 40 MG/0.4ML ~~LOC~~ SOLN
40.0000 mg | SUBCUTANEOUS | Status: DC
  Administered 2020-12-14 – 2020-12-18 (×5): 40 mg via SUBCUTANEOUS
  Filled 2020-12-13 (×5): qty 0.4

## 2020-12-13 MED ORDER — ORAL CARE MOUTH RINSE: 15.0000 mL | Freq: Once | OROMUCOSAL | Status: AC

## 2020-12-13 MED ORDER — TIZANIDINE HCL 4 MG PO TABS
4.0000 mg | ORAL_TABLET | Freq: Four times a day (QID) | ORAL | Status: DC | PRN
  Administered 2020-12-15 – 2020-12-17 (×2): 4 mg via ORAL
  Filled 2020-12-13 (×2): qty 1

## 2020-12-13 MED ORDER — ONDANSETRON HCL 4 MG/2ML IJ SOLN
INTRAMUSCULAR | Status: DC | PRN
  Administered 2020-12-13: 4 mg via INTRAVENOUS

## 2020-12-13 MED ORDER — HYDROCODONE-ACETAMINOPHEN 7.5-325 MG PO TABS: 1.0000 | ORAL_TABLET | ORAL | Status: DC | PRN

## 2020-12-13 MED ORDER — ONDANSETRON HCL 4 MG/2ML IJ SOLN: 4.0000 mg | Freq: Four times a day (QID) | INTRAMUSCULAR | Status: DC | PRN

## 2020-12-13 MED ORDER — HYDROCODONE-ACETAMINOPHEN 5-325 MG PO TABS
1.0000 | ORAL_TABLET | ORAL | Status: DC | PRN
  Administered 2020-12-13: 1 via ORAL
  Filled 2020-12-13: qty 1

## 2020-12-13 MED ORDER — DEXAMETHASONE SODIUM PHOSPHATE 10 MG/ML IJ SOLN
INTRAMUSCULAR | Status: DC | PRN
  Administered 2020-12-13: 10 mg via INTRAVENOUS

## 2020-12-13 MED ORDER — VANCOMYCIN HCL 1000 MG IV SOLR
INTRAVENOUS | Status: AC
  Filled 2020-12-13: qty 1000

## 2020-12-13 MED ORDER — FENTANYL CITRATE (PF) 250 MCG/5ML IJ SOLN
INTRAMUSCULAR | Status: AC
  Filled 2020-12-13: qty 5

## 2020-12-13 MED ORDER — CHLORHEXIDINE GLUCONATE 4 % EX LIQD: 60.0000 mL | Freq: Once | CUTANEOUS | Status: DC

## 2020-12-13 MED ORDER — PHENYLEPHRINE 40 MCG/ML (10ML) SYRINGE FOR IV PUSH (FOR BLOOD PRESSURE SUPPORT)
PREFILLED_SYRINGE | INTRAVENOUS | Status: DC | PRN
  Administered 2020-12-13: 80 ug via INTRAVENOUS
  Administered 2020-12-13: 120 ug via INTRAVENOUS
  Administered 2020-12-13: 80 ug via INTRAVENOUS

## 2020-12-13 MED ORDER — PROPOFOL 10 MG/ML IV BOLUS
INTRAVENOUS | Status: DC | PRN
  Administered 2020-12-13: 80 mg via INTRAVENOUS

## 2020-12-13 MED ORDER — HYDROMORPHONE HCL 1 MG/ML IJ SOLN
INTRAMUSCULAR | Status: AC
  Administered 2020-12-13: 0.25 mg via INTRAVENOUS
  Filled 2020-12-13: qty 1

## 2020-12-13 MED ORDER — METOCLOPRAMIDE HCL 5 MG PO TABS: 5.0000 mg | ORAL_TABLET | Freq: Three times a day (TID) | ORAL | Status: DC | PRN

## 2020-12-13 MED ORDER — LIDOCAINE 2% (20 MG/ML) 5 ML SYRINGE
INTRAMUSCULAR | Status: DC | PRN
  Administered 2020-12-13: 100 mg via INTRAVENOUS

## 2020-12-13 MED ORDER — HYDROMORPHONE HCL 1 MG/ML IJ SOLN
0.2500 mg | INTRAMUSCULAR | Status: DC | PRN
  Administered 2020-12-13 (×2): 0.5 mg via INTRAVENOUS

## 2020-12-13 MED ORDER — ACETAMINOPHEN 10 MG/ML IV SOLN
INTRAVENOUS | Status: AC
  Administered 2020-12-13: 1000 mg via INTRAVENOUS
  Filled 2020-12-13: qty 100

## 2020-12-13 SURGICAL SUPPLY — 71 items
BIT DRILL 4.3 (BIT) ×2
BIT DRILL 4.3X300MM (BIT) IMPLANT
BIT DRILL LONG 3.3 (BIT) ×1 IMPLANT
BIT DRILL QC 3.3X195 (BIT) ×1 IMPLANT
BLADE CLIPPER SURG (BLADE) IMPLANT
BNDG COHESIVE 6X5 TAN STRL LF (GAUZE/BANDAGES/DRESSINGS) ×2 IMPLANT
BNDG ELASTIC 6X10 VLCR STRL LF (GAUZE/BANDAGES/DRESSINGS) ×2 IMPLANT
BNDG ELASTIC 6X15 VLCR STRL LF (GAUZE/BANDAGES/DRESSINGS) ×1 IMPLANT
BRUSH SCRUB EZ PLAIN DRY (MISCELLANEOUS) ×4 IMPLANT
CANISTER SUCT 3000ML PPV (MISCELLANEOUS) ×1 IMPLANT
CAP LOCK NCB (Cap) ×8 IMPLANT
CHLORAPREP W/TINT 26 (MISCELLANEOUS) ×2 IMPLANT
COVER SURGICAL LIGHT HANDLE (MISCELLANEOUS) ×2 IMPLANT
COVER WAND RF STERILE (DRAPES) ×2 IMPLANT
DERMABOND ADHESIVE PROPEN (GAUZE/BANDAGES/DRESSINGS) ×1
DERMABOND ADVANCED .7 DNX6 (GAUZE/BANDAGES/DRESSINGS) IMPLANT
DRAPE C-ARM 42X72 X-RAY (DRAPES) ×2 IMPLANT
DRAPE C-ARMOR (DRAPES) ×2 IMPLANT
DRAPE HALF SHEET 40X57 (DRAPES) ×4 IMPLANT
DRAPE ORTHO SPLIT 77X108 STRL (DRAPES) ×4
DRAPE SURG 17X23 STRL (DRAPES) ×2 IMPLANT
DRAPE SURG ORHT 6 SPLT 77X108 (DRAPES) ×2 IMPLANT
DRAPE U-SHAPE 47X51 STRL (DRAPES) ×4 IMPLANT
DRSG ADAPTIC 3X8 NADH LF (GAUZE/BANDAGES/DRESSINGS) IMPLANT
DRSG MEPILEX BORDER 4X12 (GAUZE/BANDAGES/DRESSINGS) IMPLANT
DRSG MEPILEX BORDER 4X4 (GAUZE/BANDAGES/DRESSINGS) IMPLANT
DRSG MEPILEX BORDER 4X8 (GAUZE/BANDAGES/DRESSINGS) ×2 IMPLANT
DRSG PAD ABDOMINAL 8X10 ST (GAUZE/BANDAGES/DRESSINGS) ×6 IMPLANT
ELECT REM PT RETURN 9FT ADLT (ELECTROSURGICAL) ×2
ELECTRODE REM PT RTRN 9FT ADLT (ELECTROSURGICAL) ×1 IMPLANT
GAUZE SPONGE 4X4 12PLY STRL (GAUZE/BANDAGES/DRESSINGS) ×2 IMPLANT
GLOVE BIO SURGEON STRL SZ 6.5 (GLOVE) ×6 IMPLANT
GLOVE BIO SURGEON STRL SZ7.5 (GLOVE) ×8 IMPLANT
GLOVE BIOGEL PI IND STRL 7.5 (GLOVE) ×1 IMPLANT
GLOVE BIOGEL PI INDICATOR 7.5 (GLOVE) ×1
GLOVE SURG UNDER POLY LF SZ6.5 (GLOVE) ×2 IMPLANT
GOWN STRL REUS W/ TWL LRG LVL3 (GOWN DISPOSABLE) ×3 IMPLANT
GOWN STRL REUS W/TWL LRG LVL3 (GOWN DISPOSABLE) ×6
K-WIRE 2.0 (WIRE) ×2
K-WIRE FXSTD 280X2XNS SS (WIRE) ×1
KIT BASIN OR (CUSTOM PROCEDURE TRAY) ×2 IMPLANT
KIT TURNOVER KIT B (KITS) ×2 IMPLANT
KWIRE FXSTD 280X2XNS SS (WIRE) IMPLANT
NS IRRIG 1000ML POUR BTL (IV SOLUTION) ×2 IMPLANT
PACK TOTAL JOINT (CUSTOM PROCEDURE TRAY) ×2 IMPLANT
PAD ARMBOARD 7.5X6 YLW CONV (MISCELLANEOUS) ×4 IMPLANT
PAD CAST 4YDX4 CTTN HI CHSV (CAST SUPPLIES) ×1 IMPLANT
PADDING CAST COTTON 4X4 STRL (CAST SUPPLIES) ×2
PADDING CAST COTTON 6X4 STRL (CAST SUPPLIES) ×2 IMPLANT
PLATE FEM DIST NCB PP 278MM (Plate) ×1 IMPLANT
SCREW 5.0 60MM (Screw) ×1 IMPLANT
SCREW 5.0 80MM (Screw) ×2 IMPLANT
SCREW CORTICAL NCB 5.0X44 (Screw) ×1 IMPLANT
SCREW NCB 3.5X75X5X6.2XST (Screw) IMPLANT
SCREW NCB 4.0X36MM (Screw) ×1 IMPLANT
SCREW NCB 5.0X38 (Screw) ×1 IMPLANT
SCREW NCB 5.0X75MM (Screw) ×8 IMPLANT
SPONGE LAP 18X18 RF (DISPOSABLE) IMPLANT
STAPLER VISISTAT 35W (STAPLE) ×2 IMPLANT
SUCTION FRAZIER HANDLE 10FR (MISCELLANEOUS) ×2
SUCTION TUBE FRAZIER 10FR DISP (MISCELLANEOUS) ×1 IMPLANT
SUT ETHILON 3 0 PS 1 (SUTURE) ×6 IMPLANT
SUT VIC AB 0 CT1 27 (SUTURE)
SUT VIC AB 0 CT1 27XBRD ANBCTR (SUTURE) IMPLANT
SUT VIC AB 1 CT1 27 (SUTURE) ×4
SUT VIC AB 1 CT1 27XBRD ANBCTR (SUTURE) IMPLANT
SUT VIC AB 2-0 CT1 27 (SUTURE) ×4
SUT VIC AB 2-0 CT1 TAPERPNT 27 (SUTURE) ×2 IMPLANT
TOWEL GREEN STERILE (TOWEL DISPOSABLE) ×4 IMPLANT
TRAY FOLEY MTR SLVR 16FR STAT (SET/KITS/TRAYS/PACK) IMPLANT
WATER STERILE IRR 1000ML POUR (IV SOLUTION) ×4 IMPLANT

## 2020-12-13 NOTE — Anesthesia Procedure Notes (Signed)
Procedure Name: Intubation Date/Time: 12/13/2020 8:14 AM Performed by: Griffin Dakin, CRNA Pre-anesthesia Checklist: Patient identified, Emergency Drugs available, Suction available and Patient being monitored Patient Re-evaluated:Patient Re-evaluated prior to induction Oxygen Delivery Method: Circle system utilized Preoxygenation: Pre-oxygenation with 100% oxygen Induction Type: IV induction Ventilation: Mask ventilation without difficulty Laryngoscope Size: Mac and 4 Grade View: Grade I Tube type: Oral Tube size: 7.0 mm Number of attempts: 1 Airway Equipment and Method: Stylet Placement Confirmation: ETT inserted through vocal cords under direct vision,  positive ETCO2 and breath sounds checked- equal and bilateral Secured at: 23 cm Tube secured with: Tape Dental Injury: Teeth and Oropharynx as per pre-operative assessment

## 2020-12-13 NOTE — Plan of Care (Signed)
  Problem: Education: Goal: Knowledge of General Education information will improve Description: Including pain rating scale, medication(s)/side effects and non-pharmacologic comfort measures Outcome: Progressing   Problem: Activity: Goal: Risk for activity intolerance will decrease Outcome: Progressing   Problem: Coping: Goal: Level of anxiety will decrease Outcome: Progressing   Problem: Pain Managment: Goal: General experience of comfort will improve Outcome: Progressing   Problem: Safety: Goal: Ability to remain free from injury will improve Outcome: Progressing  No acute events took place overnight, prn pain medication was given Q4h. R leg was maintained in brace. Pt is aware of plan of care. Will continue to monitor.

## 2020-12-13 NOTE — Anesthesia Postprocedure Evaluation (Signed)
Anesthesia Post Note  Patient: Paula Steele  Procedure(s) Performed: OPEN REDUCTION INTERNAL FIXATION (ORIF) DISTAL FEMUR FRACTURE (Right )     Patient location during evaluation: PACU Anesthesia Type: General Level of consciousness: awake and alert Pain management: pain level controlled Vital Signs Assessment: post-procedure vital signs reviewed and stable Respiratory status: spontaneous breathing, nonlabored ventilation, respiratory function stable and patient connected to nasal cannula oxygen Cardiovascular status: blood pressure returned to baseline and stable Postop Assessment: no apparent nausea or vomiting Anesthetic complications: no   No complications documented.  Last Vitals:  Vitals:   12/13/20 1110 12/13/20 1144  BP: 135/74 (!) 152/75  Pulse: 95 95  Resp: 19 16  Temp: (!) 36.2 C (!) 36.3 C  SpO2: 99% 99%    Last Pain:  Vitals:   12/13/20 1144  TempSrc: Oral  PainSc: Asleep                 Pearlean Sabina S

## 2020-12-13 NOTE — Interval H&P Note (Signed)
History and Physical Interval Note:  12/13/2020 7:49 AM  Paula Steele  has presented today for surgery, with the diagnosis of Right distal femur fx.  The various methods of treatment have been discussed with the patient and family. After consideration of risks, benefits and other options for treatment, the patient has consented to  Procedure(s): OPEN REDUCTION INTERNAL FIXATION (ORIF) DISTAL FEMUR FRACTURE (Right) as a surgical intervention.  The patient's history has been reviewed, patient examined, no change in status, stable for surgery.  I have reviewed the patient's chart and labs.  Questions were answered to the patient's satisfaction.     Lennette Bihari P Evelyna Folker

## 2020-12-13 NOTE — Progress Notes (Signed)
Inpatient Diabetes Program Recommendations  AACE/ADA: New Consensus Statement on Inpatient Glycemic Control (2015)  Target Ranges:  Prepandial:   less than 140 mg/dL      Peak postprandial:   less than 180 mg/dL (1-2 hours)      Critically ill patients:  140 - 180 mg/dL   Lab Results  Component Value Date   GLUCAP 178 (H) 12/13/2020   HGBA1C 8.6 (H) 12/11/2020    Review of Glycemic Control  Diabetes history: DM 2 Outpatient Diabetes medications: Ozempic 1 mg QSunday, Tresiba 42 units Daily Current orders for Inpatient glycemic control:  Lantus 21 units Daily Novolog 0-9 units Q4 hours  A1c 8.6% on 2/21  Inpatient Diabetes Program Recommendations:    Decadron 10 mg given during surgery  -  Increase  Lantus to 30 units - Increase Novolog Correction to "moderate" 0-15 units  Thanks, Tama Headings RN, MSN, BC-ADM Inpatient Diabetes Coordinator Team Pager [phone] (8a-5p)

## 2020-12-13 NOTE — Transfer of Care (Signed)
Immediate Anesthesia Transfer of Care Note  Patient: Paula Steele  Procedure(s) Performed: OPEN REDUCTION INTERNAL FIXATION (ORIF) DISTAL FEMUR FRACTURE (Right )  Patient Location: PACU  Anesthesia Type:General  Level of Consciousness: awake, alert  and oriented  Airway & Oxygen Therapy: Patient Spontanous Breathing and Patient connected to face mask oxygen  Post-op Assessment: Report given to RN and Post -op Vital signs reviewed and stable  Post vital signs: Reviewed and stable  Last Vitals:  Vitals Value Taken Time  BP 138/70 12/13/20 0939  Temp    Pulse 107 12/13/20 0942  Resp 23 12/13/20 0942  SpO2 95 % 12/13/20 0942  Vitals shown include unvalidated device data.  Last Pain:  Vitals:   12/13/20 0610  TempSrc:   PainSc: 6       Patients Stated Pain Goal: 2 (16/10/96 0454)  Complications: No complications documented.

## 2020-12-13 NOTE — Anesthesia Preprocedure Evaluation (Signed)
Anesthesia Evaluation  Patient identified by MRN, date of birth, ID band Patient awake    Reviewed: Allergy & Precautions, H&P , NPO status , Patient's Chart, lab work & pertinent test results  Airway Mallampati: II  TM Distance: >3 FB Neck ROM: Full    Dental no notable dental hx.    Pulmonary neg pulmonary ROS, former smoker,    Pulmonary exam normal breath sounds clear to auscultation       Cardiovascular hypertension, Pt. on medications Normal cardiovascular exam Rhythm:Regular Rate:Normal     Neuro/Psych negative neurological ROS  negative psych ROS   GI/Hepatic GERD  Medicated,(+) Hepatitis -NASH   Endo/Other  negative endocrine ROSdiabetes  Renal/GU negative Renal ROS  negative genitourinary   Musculoskeletal negative musculoskeletal ROS (+)   Abdominal   Peds negative pediatric ROS (+)  Hematology negative hematology ROS (+)   Anesthesia Other Findings   Reproductive/Obstetrics negative OB ROS                             Anesthesia Physical Anesthesia Plan  ASA: III  Anesthesia Plan: General   Post-op Pain Management:    Induction: Intravenous  PONV Risk Score and Plan: 3 and Ondansetron, Dexamethasone and Treatment may vary due to age or medical condition  Airway Management Planned: Oral ETT  Additional Equipment:   Intra-op Plan:   Post-operative Plan: Extubation in OR  Informed Consent: I have reviewed the patients History and Physical, chart, labs and discussed the procedure including the risks, benefits and alternatives for the proposed anesthesia with the patient or authorized representative who has indicated his/her understanding and acceptance.     Dental advisory given  Plan Discussed with: CRNA and Surgeon  Anesthesia Plan Comments:         Anesthesia Quick Evaluation

## 2020-12-13 NOTE — Progress Notes (Signed)
PROGRESS NOTE    Paula Steele  VFI:[id number] DOB: [birth year] DOA: 12/10/2020 PCP: Adaline Sill, NP    Brief Narrative:  Patient with history of type 2 diabetes on insulin, hypertension, Karlene Lineman cirrhosis, history of right knee replacement presented to the hospital with mechanical fall, tripped down a curb.  Severe right pain.  Found to have periprosthetic right distal femoral fracture.  Admitted for operative intervention.   Assessment & Plan:   Principal Problem:   Closed fracture of right distal femur ([name]) Active Problems:   DM2 (diabetes mellitus, type 2) (HCC)   NASH (nonalcoholic steatohepatitis)   Obesity   Hypertension   Acute urinary retention  Closed periprosthetic right distal femoral fracture: With total knee in place S/p ORIF 2/23 Adequate pain medications along with laxatives, uses percocet at home . Changed  Dilaudid PRN for severe pain. lovenox for DVT prophylaxis. 50% weight bearing right leg Start PT/OT today.  Type 2 diabetes: Adequate controlled on current dose of insulin.  Continue.  NASH cirrhosis: Stable.  Liver test normal.  Hypertension: Blood pressure stable on current regimen.  Urinary retention: Due to acute fracture, pain and immobility.  Foley catheter until mobilization after surgery.   DVT prophylaxis: enoxaparin (LOVENOX) injection 40 mg Start: 12/14/20 0800 SCDs Start: 12/13/20 1140 SCDs Start: 12/11/20 0440   Code Status: Full code Family Communication: daughter at the bedside. Disposition Plan: Status is: Inpatient  Remains inpatient appropriate because:Inpatient level of care appropriate due to severity of illness   Dispo: The patient is from: Home              Anticipated d/c is to: SNF              Anticipated d/c date is: 3 days              Patient currently is not medically stable to d/c.   Difficult to place patient No         Consultants:   Orthopedics  Procedures:   ORIF right distal  femur.  Antimicrobials:   None   Subjective: Patient seen and examined.  Came back from surgery. Pain is controlled. Dtr at bedside, states that she uses 10 mg percocet at baseline . Looking forward to go to rehab in next few days.   Objective: Vitals:   12/13/20 1055 12/13/20 1110 12/13/20 1144 12/13/20 1306  BP: (!) 146/74 135/74 (!) 152/75 126/73  Pulse: 91 95 95 94  Resp: 13 19 16 16   Temp:  (!) 97.2 F (36.2 C) (!) 97.4 F (36.3 C) 97.9 F (36.6 C)  TempSrc:   Oral Oral  SpO2: 100% 99% 99% 96%    Intake/Output Summary (Last 24 hours) at 12/13/2020 1352 Last data filed at 12/13/2020 1100 Gross per 24 hour  Intake --  Output 825 ml  Net -825 ml   There were no vitals filed for this visit.  Examination:  General exam: Appears calm and comfortable  Anxious. Respiratory system: Clear to auscultation. Respiratory effort normal. Cardiovascular system: S1 & S2 heard, RRR. No JVD, murmurs, rubs, gallops or clicks. No pedal edema. Gastrointestinal system: Abdomen is nondistended, soft and nontender. No organomegaly or masses felt. Normal bowel sounds heard. Right knee with immediate post op dressing and compression bandage. neurovascular status intact.    Data Reviewed: I have personally reviewed following labs and imaging studies  CBC: Recent Labs  Lab 12/11/20 0020 12/13/20 1245  WBC 6.6 7.9  NEUTROABS 5.3  --  HGB 12.9 10.9*  HCT 37.7 32.1*  MCV 86.5 86.1  PLT 154 485*   Basic Metabolic Panel: Recent Labs  Lab 12/11/20 0020 12/13/20 1245  NA 138  --   K 3.7  --   CL 103  --   CO2 26  --   GLUCOSE 242*  --   BUN 16  --   CREATININE 0.78 0.60  CALCIUM 9.4  --    GFR: CrCl cannot be calculated (Unknown ideal weight.). Liver Function Tests: Recent Labs  Lab 12/11/20 1811  AST 36  ALT 33  ALKPHOS 125  BILITOT 0.7  PROT 7.9  ALBUMIN 3.6   No results for input(s): LIPASE, AMYLASE in the last 168 hours. No results for input(s): AMMONIA in  the last 168 hours. Coagulation Profile: No results for input(s): INR, PROTIME in the last 168 hours. Cardiac Enzymes: No results for input(s): CKTOTAL, CKMB, CKMBINDEX, TROPONINI in the last 168 hours. BNP (last 3 results) No results for input(s): PROBNP in the last 8760 hours. HbA1C: Recent Labs    12/11/20 0511  HGBA1C 8.6*   CBG: Recent Labs  Lab 12/12/20 1952 12/13/20 0130 12/13/20 0409 12/13/20 0943 12/13/20 1153  GLUCAP 229* 180* 216* 178* 215*   Lipid Profile: No results for input(s): CHOL, HDL, LDLCALC, TRIG, CHOLHDL, LDLDIRECT in the last 72 hours. Thyroid Function Tests: No results for input(s): TSH, T4TOTAL, FREET4, T3FREE, THYROIDAB in the last 72 hours. Anemia Panel: No results for input(s): VITAMINB12, FOLATE, FERRITIN, TIBC, IRON, RETICCTPCT in the last 72 hours. Sepsis Labs: No results for input(s): PROCALCITON, LATICACIDVEN in the last 168 hours.  Recent Results (from the past 240 hour(s))  SARS CORONAVIRUS 2 (TAT 6-24 HRS) Nasopharyngeal Nasopharyngeal Swab     Status: None   Collection Time: 12/11/20  2:08 AM   Specimen: Nasopharyngeal Swab  Result Value Ref Range Status   SARS Coronavirus 2 NEGATIVE NEGATIVE Final    Comment: (NOTE) SARS-CoV-2 target nucleic acids are NOT DETECTED.  The SARS-CoV-2 RNA is generally detectable in upper and lower respiratory specimens during the acute phase of infection. Negative results do not preclude SARS-CoV-2 infection, do not rule out co-infections with other pathogens, and should not be used as the sole basis for treatment or other patient management decisions. Negative results must be combined with clinical observations, patient history, and epidemiological information. The expected result is Negative.  Fact Sheet for Patients: SugarRoll.be  Fact Sheet for Healthcare Providers: https://www.woods-mathews.com/  This test is not yet approved or cleared by the [place] FDA and  has been authorized for detection and/or diagnosis of SARS-CoV-2 by FDA under an Emergency Use Authorization (EUA). This EUA will remain  in effect (meaning this test can be used) for the duration of the COVID-19 declaration under Se ction 564(b)(1) of the Act, 21 U.S.C. section 360bbb-3(b)(1), unless the authorization is terminated or revoked sooner.  Performed at [facility], [place] [address] [place], [place] [address]   Surgical pcr screen     Status: None   Collection Time: 12/11/20  6:14 PM   Specimen: Nasal Mucosa; Nasal Swab  Result Value Ref Range Status   MRSA, PCR NEGATIVE NEGATIVE Final   Staphylococcus aureus NEGATIVE NEGATIVE Final    Comment: (NOTE) The Xpert SA Assay (FDA approved for NASAL specimens in patients 77 years of age and older), is one component of a comprehensive surveillance program. It is not intended to diagnose infection nor to guide or monitor treatment. Performed at  [facility], [place] [address], [place], [place] [address]          Radiology Studies: DG Knee Right Port  Result Date: 12/13/2020 CLINICAL DATA:  ORIF. EXAM: PORTABLE RIGHT KNEE - 1-2 VIEW COMPARISON:  04/19/2020. FINDINGS: Plate and screw fixation distal femoral fracture. Slight medial displacement of the distal fracture fragment. Total right knee replacement. Peripheral vascular calcification. IMPRESSION: Plate and screw fixation distal right femoral fracture. Slight medial displacement of the distal fracture fragment. Electronically Signed   By: Marcello Moores  Register   On: 12/13/2020 12:25   DG C-Arm 1-60 Min  Result Date: 12/13/2020 CLINICAL DATA:  Surgery, elective. Additional history provided: ORIF distal right femur. Provided fluoroscopy time 1 minutes, 10 seconds (5.67 mGy). EXAM: RIGHT FEMUR 2 VIEWS; DG C-ARM 1-60 MIN COMPARISON:  Radiographs of the right femoral 12/11/2020. FINDINGS: Eight intraoperative fluoroscopic images of the right femur are  submitted. The images demonstrate ORIF of an acute periprosthetic distal right femur fracture utilizing a lateral plate and screws. There is some persistent displacement at the metadiaphyseal fracture site, but alignment is near anatomic. IMPRESSION: Eight intraoperative fluoroscopic images from ORIF of a periprosthetic distal right femur fracture, as described. Electronically Signed   By: Kellie Simmering DO   On: 12/13/2020 09:39   DG FEMUR, MIN 2 VIEWS RIGHT  Result Date: 12/13/2020 CLINICAL DATA:  Surgery, elective. Additional history provided: ORIF distal right femur. Provided fluoroscopy time 1 minutes, 10 seconds (5.67 mGy). EXAM: RIGHT FEMUR 2 VIEWS; DG C-ARM 1-60 MIN COMPARISON:  Radiographs of the right femoral 12/11/2020. FINDINGS: Eight intraoperative fluoroscopic images of the right femur are submitted. The images demonstrate ORIF of an acute periprosthetic distal right femur fracture utilizing a lateral plate and screws. There is some persistent displacement at the metadiaphyseal fracture site, but alignment is near anatomic. IMPRESSION: Eight intraoperative fluoroscopic images from ORIF of a periprosthetic distal right femur fracture, as described. Electronically Signed   By: Kellie Simmering DO   On: 12/13/2020 09:39        Scheduled Meds: . Chlorhexidine Gluconate Cloth  6 each Topical Daily  . diazepam      . docusate sodium  100 mg Oral BID  . [START ON 12/14/2020] enoxaparin (LOVENOX) injection  40 mg Subcutaneous Q24H  . feeding supplement (GLUCERNA SHAKE)  237 mL Oral TID BM  . fluticasone  1 spray Each Nare Daily  . insulin aspart  0-9 Units Subcutaneous Q4H  . insulin glargine  21 Units Subcutaneous Q1400  . mometasone-formoterol  2 puff Inhalation BID  . montelukast  10 mg Oral QHS  . multivitamin with minerals  1 tablet Oral Daily  . pantoprazole  40 mg Oral Daily  . pregabalin  50 mg Oral BID  . vortioxetine HBr  10 mg Oral Daily   Continuous Infusions: . 0.9 % NaCl with  KCl 20 mEq / L 75 mL/hr at 12/13/20 1203  .  ceFAZolin (ANCEF) IV    . lactated ringers 10 mL/hr at 12/13/20 0806     LOS: 2 days    Time spent: 30 minutes    Barb Merino, MD [facility] Pager [phone]

## 2020-12-13 NOTE — Op Note (Signed)
Orthopaedic Surgery Operative Note (CSN: [id number] ) Date of Surgery: 12/13/2020  Admit Date: 12/10/2020   Diagnoses: Pre-Op Diagnoses: Right periprosthetic distal femur fracture   Post-Op Diagnosis: Same  Procedures: CPT 27511-Open reduction internal fixation of right distal femur fracture  Surgeons : Primary: Shona Needles, MD  Assistant: Patrecia Pace, PA-C  Location: OR 6   Anesthesia:General  Antibiotics: Ancef 2g preop with 1 gm vancomycin powder placed topically   Tourniquet time:None  Estimated Blood SELT:53 mL  Complications:None   Specimens:None   Implants: Implant Name Type Inv. Item Serial No. Manufacturer Lot No. LRB No. Used Action  PLATE FEM DIST NCB PP 278MM - [id number] Plate PLATE FEM DIST NCB PP 278MM  [employer](ORTH,TRAU,BIO,SG)  Right 1 Implanted  CAP LOCK NCB - [id number] Cap CAP LOCK NCB  [employer](ORTH,TRAU,BIO,SG)  Right 1 Implanted  SCREW NCB 5.0X75MM - [id number] Screw SCREW NCB 5.0X75MM  [employer](ORTH,TRAU,BIO,SG)  Right 4 Implanted  SCREW NCB 5.0X38 - [id number] Screw SCREW NCB 5.0X38  [employer](ORTH,TRAU,BIO,SG)  Right 1 Implanted  SCREW 5.0 80MM - [id number] Screw SCREW 5.0 80MM  [employer](ORTH,TRAU,BIO,SG)  Right 2 Implanted  SCREW CORTICAL NCB 5.0X44 - [id number] Screw SCREW CORTICAL NCB 5.0X44  [employer](ORTH,TRAU,BIO,SG)  Right 1 Implanted  SCREW NCB 4.0X36MM - [id number] Screw SCREW NCB 4.0X36MM  [employer](ORTH,TRAU,BIO,SG)  Right 8 Implanted     Indications for Surgery: 75 year old female who fell and sustained a right periprosthetic distal femur fracture.  Due to the unstable nature of her injury I recommended proceeding with open reduction internal fixation.  Risks and benefits were discussed with the patient including the risk for bleeding, infection, malunion, nonunion, hardware failure, hardware irritation, nerve or blood vessel injury, knee stiffness, DVT, even the possibility anesthetic complications.  The patient  agreed to proceed with surgery and consent was obtained.  Operative Findings: Open reduction internal fixation of right periprosthetic distal femur fracture using [employer] NCB 12 hole distal femoral locking plate  Procedure: The patient was identified in the preoperative holding area. Consent was confirmed with the patient and their family and all questions were answered. The operative extremity was marked after confirmation with the patient. she was then brought back to the operating room by our anesthesia colleagues.  She was placed under general anesthetic and carefully transferred over to a radiolucent flat top table.  A bump was placed under her operative hip.  The right lower extremity was then prepped and draped in usual sterile fashion.  A timeout was performed to verify the patient, the procedure, and the extremity.  Preoperative antibiotics were dosed.  Fluoroscopic imaging was obtained to show the unstable nature of her injury.  A lateral approach to the distal femur was carried down through skin and subcutaneous tissue.  The IT band was split in line with my incision I exposed the lateral aspect of the distal femur.  I then mobilized the vastus lateralis and used a Cobb elevator to develop a plane between the lateral cortex and the vastus.  I then used a bone hook to manipulate the shaft segment and was able to reduce the fracture with assistance using a triangle with the hip and knee flexed.  I then attached a NCB 12 hole distal femoral locking plate to a targeting arm and slid this submuscularly along the lateral cortex of the femur.  I then placed a 2.0 mm K wire distally to hold the distal portion of the plate and then I used a percutaneous 3.3 mm  drill bit to drill bicortically proximally to hold the proximal portion of the plate.  I then returned to the distal segment and drilled with a 3.3 mm drill bit and placed 5.0 millimeter screws to bring the plate flush to bone and maintained  appropriate coronal alignment.  I placed 2 screws here and then proceeded to use the targeting arm to place 5.0 millimeter screws into the femoral shaft.  Two 5.0 millimeter screws were placed in the femoral shaft and then I removed the 3.3 mm drill bit and placed a 4.0 millimeter screw in the most proximal shaft hole.  I then placed locking caps on the 5.0 millimeter screws in the femoral shaft and removed the targeting arm.  I then returned to the distal segment and proceeded to place 5.0 millimeter screws into the metaphyseal bone.  A total of 6 screws were placed distally.  Locking caps were placed on all of the distal screws.  Final fluoroscopic imaging was then obtained.  The incision was copiously irrigated.  A gram of vancomycin powder was placed into the incision.  The IT band was closed with 0 Vicryl suture.  The skin was closed with 2-0 Vicryl and 3-0 Monocryl.  The skin was sealed with Dermabond.  A sterile dressing was placed.  Patient was then awoken from anesthesia and taken to the PACU in stable condition.   Post Op Plan/Instructions: The patient will be partial weightbearing up to 50% weightbearing on the right lower extremity as tolerated.  She will receive Lovenox for DVT prophylaxis.  She will be placed on Ancef for surgical prophylaxis.  We will mobilize her with physical and Occupational Therapy.  I was present and performed the entire surgery.  Patrecia Pace, PA-C did assist me throughout the case. An assistant was necessary given the difficulty in approach, maintenance of reduction and ability to instrument the fracture.   Katha Hamming, MD [facility]

## 2020-12-14 ENCOUNTER — Encounter (HOSPITAL_COMMUNITY): Payer: Self-pay | Admitting: Internal Medicine

## 2020-12-14 ENCOUNTER — Other Ambulatory Visit: Payer: Self-pay

## 2020-12-14 DIAGNOSIS — I10 Essential (primary) hypertension: Secondary | ICD-10-CM | POA: Diagnosis not present

## 2020-12-14 DIAGNOSIS — R338 Other retention of urine: Secondary | ICD-10-CM | POA: Diagnosis not present

## 2020-12-14 DIAGNOSIS — S72401A Unspecified fracture of lower end of right femur, initial encounter for closed fracture: Secondary | ICD-10-CM | POA: Diagnosis not present

## 2020-12-14 DIAGNOSIS — E119 Type 2 diabetes mellitus without complications: Secondary | ICD-10-CM | POA: Diagnosis not present

## 2020-12-14 LAB — BASIC METABOLIC PANEL
Anion gap: 8 (ref 5–15)
BUN: 18 mg/dL (ref 8–23)
CO2: 24 mmol/L (ref 22–32)
Calcium: 8.6 mg/dL — ABNORMAL LOW (ref 8.9–10.3)
Chloride: 105 mmol/L (ref 98–111)
Creatinine, Ser: 0.6 mg/dL (ref 0.44–1.00)
GFR, Estimated: >60 mL/min (ref >60)
Glucose, Bld: 219 mg/dL — ABNORMAL HIGH (ref 70–99)
Potassium: 4.2 mmol/L (ref 3.5–5.1)
Sodium: 137 mmol/L (ref 135–145)

## 2020-12-14 LAB — CBC
HCT: 26.9 % — ABNORMAL LOW (ref 36.0–46.0)
Hemoglobin: 9.3 g/dL — ABNORMAL LOW (ref 12.0–15.0)
MCH: 29.4 pg (ref 26.0–34.0)
MCHC: 34.6 g/dL (ref 30.0–36.0)
MCV: 85.1 fL (ref 80.0–100.0)
Platelets: 137 10*3/uL — ABNORMAL LOW (ref 150–400)
RBC: 3.16 MIL/uL — ABNORMAL LOW (ref 3.87–5.11)
RDW: 14 % (ref 11.5–15.5)
WBC: 7.1 10*3/uL (ref 4.0–10.5)
nRBC: 0 % (ref 0.0–0.2)

## 2020-12-14 LAB — GLUCOSE, CAPILLARY
Glucose-Capillary: 167 mg/dL — ABNORMAL HIGH (ref 70–99)
Glucose-Capillary: 179 mg/dL — ABNORMAL HIGH (ref 70–99)
Glucose-Capillary: 232 mg/dL — ABNORMAL HIGH (ref 70–99)
Glucose-Capillary: 238 mg/dL — ABNORMAL HIGH (ref 70–99)
Glucose-Capillary: 252 mg/dL — ABNORMAL HIGH (ref 70–99)
Glucose-Capillary: 285 mg/dL — ABNORMAL HIGH (ref 70–99)
Glucose-Capillary: 285 mg/dL — ABNORMAL HIGH (ref 70–99)

## 2020-12-14 LAB — VITAMIN D 25 HYDROXY (VIT D DEFICIENCY, FRACTURES): Vit D, 25-Hydroxy: 48.18 ng/mL (ref 30–100)

## 2020-12-14 MED ORDER — OXYCODONE HCL 5 MG PO TABS
5.0000 mg | ORAL_TABLET | Freq: Four times a day (QID) | ORAL | Status: DC | PRN
  Administered 2020-12-14 – 2020-12-18 (×12): 5 mg via ORAL
  Filled 2020-12-14 (×12): qty 1

## 2020-12-14 MED ORDER — OXYCODONE-ACETAMINOPHEN 5-325 MG PO TABS
1.0000 | ORAL_TABLET | Freq: Four times a day (QID) | ORAL | Status: DC | PRN
  Administered 2020-12-14 – 2020-12-18 (×11): 1 via ORAL
  Filled 2020-12-14 (×11): qty 1

## 2020-12-14 MED ORDER — PREGABALIN 25 MG PO CAPS
50.0000 mg | ORAL_CAPSULE | Freq: Two times a day (BID) | ORAL | Status: DC
  Administered 2020-12-14 – 2020-12-18 (×8): 50 mg via ORAL
  Filled 2020-12-14 (×8): qty 2

## 2020-12-14 MED ORDER — KETOROLAC TROMETHAMINE 15 MG/ML IJ SOLN
15.0000 mg | Freq: Four times a day (QID) | INTRAMUSCULAR | Status: AC
  Administered 2020-12-14 – 2020-12-15 (×5): 15 mg via INTRAVENOUS
  Filled 2020-12-14 (×5): qty 1

## 2020-12-14 NOTE — Progress Notes (Signed)
PROGRESS NOTE    Paula Steele  DJM:[id number] DOB: 10-Dec-1945 DOA: 12/10/2020 PCP: Adaline Sill, NP    Brief Narrative:  Patient with history of type 2 diabetes on insulin, hypertension, Karlene Lineman cirrhosis, history of right knee replacement presented to the hospital with mechanical fall, tripped down a curb.  Severe right pain.  Found to have periprosthetic right distal femoral fracture.  Admitted for operative intervention. 2/23, underwent ORIF.   Assessment & Plan:   Principal Problem:   Closed fracture of right distal femur ([place]) Active Problems:   DM2 (diabetes mellitus, type 2) (HCC)   NASH (nonalcoholic steatohepatitis)   Obesity   Hypertension   Acute urinary retention  Closed periprosthetic right distal femoral fracture: With total knee in place S/p ORIF 2/23 Adequate pain medications along with laxatives, uses percocet at home . Changed  Dilaudid PRN for severe pain. lovenox for DVT prophylaxis. 50% weight bearing right leg Work with PT OT.  Refer to a skilled living facility for inpatient therapies. Patient is on chronic pain medication at home with Percocet 10/325 3 times a day. Currently on Percocet 10 mg every 6 hours, Dilaudid, Toradol.  She is also on Lyrica at home.  Has chronic pain management with her primary care provider.  Type 2 diabetes: Adequate controlled on current dose of insulin.  Continue.  NASH cirrhosis: Stable.  Liver test normal.  Hypertension: Blood pressure stable on current regimen.  Urinary retention: Due to acute fracture, pain and immobility.  Foley catheter until mobilization after surgery.   DVT prophylaxis: enoxaparin (LOVENOX) injection 40 mg Start: 12/14/20 0800 SCDs Start: 12/13/20 1140 SCDs Start: 12/11/20 0440   Code Status: Full code Family Communication: daughter at the bedside 2/23. Disposition Plan: Status is: Inpatient  Remains inpatient appropriate because:Inpatient level of care appropriate due to severity of  illness   Dispo: The patient is from: Home              Anticipated d/c is to: SNF              Anticipated d/c date is: 1 to 2 days.              Patient currently is not medically stable to d/c.   Difficult to place patient No         Consultants:   Orthopedics  Procedures:   ORIF right distal femur.  Antimicrobials:   None   Subjective: Patient seen and examined.  She was worried about her Lyrica schedule not getting as she was taking at home.  Also worried about change of her chronic pain management to schedule.  I assured her that she will be prescribed additional pain medicine for next 5 to 7 days then she will go back to her home medications. She is looking forward to work with therapist today.  Pain is controlled with Dilaudid.   Objective: Vitals:   12/13/20 1306 12/13/20 1405 12/13/20 1501 12/13/20 1931  BP: 126/73 135/64 126/60 128/61  Pulse: 94 96 96 97  Resp: 16 16 16 17   Temp: 97.9 F (36.6 C) 97.7 F (36.5 C) (!) 97.4 F (36.3 C) 97.6 F (36.4 C)  TempSrc: Oral Oral Oral Oral  SpO2: 96% 98% 98% 98%    Intake/Output Summary (Last 24 hours) at 12/14/2020 1100 Last data filed at 12/14/2020 0323 Gross per 24 hour  Intake 1394.27 ml  Output 875 ml  Net 519.27 ml   There were no vitals filed for this visit.  Examination:  General exam: Appears calm and comfortable  Looks comfortable today. Respiratory system: Clear to auscultation. Respiratory effort normal. Cardiovascular system: S1 & S2 heard, RRR. No JVD, murmurs, rubs, gallops or clicks. No pedal edema. Gastrointestinal system: Abdomen is nondistended, soft and nontender. No organomegaly or masses felt. Normal bowel sounds heard. Right knee with immediate post op dressing and compression bandage. neurovascular status intact.    Data Reviewed: I have personally reviewed following labs and imaging studies  CBC: Recent Labs  Lab 12/11/20 0020 12/13/20 1245 12/14/20 0203  WBC 6.6 7.9  7.1  NEUTROABS 5.3  --   --   HGB 12.9 10.9* 9.3*  HCT 37.7 32.1* 26.9*  MCV 86.5 86.1 85.1  PLT 154 142* 338*   Basic Metabolic Panel: Recent Labs  Lab 12/11/20 0020 12/13/20 1245 12/14/20 0203  NA 138  --  137  K 3.7  --  4.2  CL 103  --  105  CO2 26  --  24  GLUCOSE 242*  --  219*  BUN 16  --  18  CREATININE 0.78 0.60 0.60  CALCIUM 9.4  --  8.6*   GFR: CrCl cannot be calculated (Unknown ideal weight.). Liver Function Tests: Recent Labs  Lab 12/11/20 1811  AST 36  ALT 33  ALKPHOS 125  BILITOT 0.7  PROT 7.9  ALBUMIN 3.6   No results for input(s): LIPASE, AMYLASE in the last 168 hours. No results for input(s): AMMONIA in the last 168 hours. Coagulation Profile: No results for input(s): INR, PROTIME in the last 168 hours. Cardiac Enzymes: No results for input(s): CKTOTAL, CKMB, CKMBINDEX, TROPONINI in the last 168 hours. BNP (last 3 results) No results for input(s): PROBNP in the last 8760 hours. HbA1C: No results for input(s): HGBA1C in the last 72 hours. CBG: Recent Labs  Lab 12/13/20 1711 12/13/20 1931 12/14/20 0017 12/14/20 0441 12/14/20 0759  GLUCAP 399* 357* 238* 179* 167*   Lipid Profile: No results for input(s): CHOL, HDL, LDLCALC, TRIG, CHOLHDL, LDLDIRECT in the last 72 hours. Thyroid Function Tests: No results for input(s): TSH, T4TOTAL, FREET4, T3FREE, THYROIDAB in the last 72 hours. Anemia Panel: No results for input(s): VITAMINB12, FOLATE, FERRITIN, TIBC, IRON, RETICCTPCT in the last 72 hours. Sepsis Labs: No results for input(s): PROCALCITON, LATICACIDVEN in the last 168 hours.  Recent Results (from the past 240 hour(s))  SARS CORONAVIRUS 2 (TAT 6-24 HRS) Nasopharyngeal Nasopharyngeal Swab     Status: None   Collection Time: 12/11/20  2:08 AM   Specimen: Nasopharyngeal Swab  Result Value Ref Range Status   SARS Coronavirus 2 NEGATIVE NEGATIVE Final    Comment: (NOTE) SARS-CoV-2 target nucleic acids are NOT DETECTED.  The SARS-CoV-2  RNA is generally detectable in upper and lower respiratory specimens during the acute phase of infection. Negative results do not preclude SARS-CoV-2 infection, do not rule out co-infections with other pathogens, and should not be used as the sole basis for treatment or other patient management decisions. Negative results must be combined with clinical observations, patient history, and epidemiological information. The expected result is Negative.  Fact Sheet for Patients: SugarRoll.be  Fact Sheet for Healthcare Providers: https://www.woods-mathews.com/  This test is not yet approved or cleared by the [place] FDA and  has been authorized for detection and/or diagnosis of SARS-CoV-2 by FDA under an Emergency Use Authorization (EUA). This EUA will remain  in effect (meaning this test can be used) for the duration of the COVID-19 declaration under Se ction 564(b)(1)  of the Act, 21 U.S.C. section 360bbb-3(b)(1), unless the authorization is terminated or revoked sooner.  Performed at [facility], [place] [address] [place], [place] [address]   Surgical pcr screen     Status: None   Collection Time: 12/11/20  6:14 PM   Specimen: Nasal Mucosa; Nasal Swab  Result Value Ref Range Status   MRSA, PCR NEGATIVE NEGATIVE Final   Staphylococcus aureus NEGATIVE NEGATIVE Final    Comment: (NOTE) The Xpert SA Assay (FDA approved for NASAL specimens in patients 73 years of age and older), is one component of a comprehensive surveillance program. It is not intended to diagnose infection nor to guide or monitor treatment. Performed at [facility], [place] [address], [place],  [address]          Radiology Studies: DG Knee Right Port  Result Date: 12/13/2020 CLINICAL DATA:  ORIF. EXAM: PORTABLE RIGHT KNEE - 1-2 VIEW COMPARISON:  04/19/2020. FINDINGS: Plate and screw fixation distal femoral fracture. Slight medial displacement of  the distal fracture fragment. Total right knee replacement. Peripheral vascular calcification. IMPRESSION: Plate and screw fixation distal right femoral fracture. Slight medial displacement of the distal fracture fragment. Electronically Signed   By: Marcello Moores  Register   On: 12/13/2020 12:25   DG C-Arm 1-60 Min  Result Date: 12/13/2020 CLINICAL DATA:  Surgery, elective. Additional history provided: ORIF distal right femur. Provided fluoroscopy time 1 minutes, 10 seconds (5.67 mGy). EXAM: RIGHT FEMUR 2 VIEWS; DG C-ARM 1-60 MIN COMPARISON:  Radiographs of the right femoral 12/11/2020. FINDINGS: Eight intraoperative fluoroscopic images of the right femur are submitted. The images demonstrate ORIF of an acute periprosthetic distal right femur fracture utilizing a lateral plate and screws. There is some persistent displacement at the metadiaphyseal fracture site, but alignment is near anatomic. IMPRESSION: Eight intraoperative fluoroscopic images from ORIF of a periprosthetic distal right femur fracture, as described. Electronically Signed   By: Kellie Simmering DO   On: 12/13/2020 09:39   DG FEMUR, MIN 2 VIEWS RIGHT  Result Date: 12/13/2020 CLINICAL DATA:  Surgery, elective. Additional history provided: ORIF distal right femur. Provided fluoroscopy time 1 minutes, 10 seconds (5.67 mGy). EXAM: RIGHT FEMUR 2 VIEWS; DG C-ARM 1-60 MIN COMPARISON:  Radiographs of the right femoral 12/11/2020. FINDINGS: Eight intraoperative fluoroscopic images of the right femur are submitted. The images demonstrate ORIF of an acute periprosthetic distal right femur fracture utilizing a lateral plate and screws. There is some persistent displacement at the metadiaphyseal fracture site, but alignment is near anatomic. IMPRESSION: Eight intraoperative fluoroscopic images from ORIF of a periprosthetic distal right femur fracture, as described. Electronically Signed   By: Kellie Simmering DO   On: 12/13/2020 09:39        Scheduled Meds: .  Chlorhexidine Gluconate Cloth  6 each Topical Daily  . docusate sodium  100 mg Oral BID  . enoxaparin (LOVENOX) injection  40 mg Subcutaneous Q24H  . feeding supplement (GLUCERNA SHAKE)  237 mL Oral TID BM  . fluticasone  1 spray Each Nare Daily  . insulin aspart  0-9 Units Subcutaneous Q4H  . insulin glargine  21 Units Subcutaneous Q1400  . ketorolac  15 mg Intravenous Q6H  . mometasone-formoterol  2 puff Inhalation BID  . montelukast  10 mg Oral QHS  . multivitamin with minerals  1 tablet Oral Daily  . pantoprazole  40 mg Oral Daily  . pregabalin  50 mg Oral BID  . vortioxetine HBr  10 mg Oral Daily  Continuous Infusions: . lactated ringers 10 mL/hr at 12/13/20 0806     LOS: 3 days    Time spent: 30 minutes    Barb Merino, MD [facility] Pager [phone]

## 2020-12-14 NOTE — Progress Notes (Signed)
Inpatient Diabetes Program Recommendations  AACE/ADA: New Consensus Statement on Inpatient Glycemic Control (2015)  Target Ranges:  Prepandial:   less than 140 mg/dL      Peak postprandial:   less than 180 mg/dL (1-2 hours)      Critically ill patients:  140 - 180 mg/dL   Lab Results  Component Value Date   GLUCAP 167 (H) 12/14/2020   HGBA1C 8.6 (H) 12/11/2020    Review of Glycemic Control  Diabetes history: DM 2 Outpatient Diabetes medications: Ozempic 1 mg QSunday, Tresiba 42 units Daily Current orders for Inpatient glycemic control:  Lantus 21 units Daily Novolog 0-9 units Q4 hours  A1c 8.6% on 2/21  Inpatient Diabetes Program Recommendations:    -  Increase Lantus to 25 units   Thanks, Tama Headings RN, MSN, BC-ADM Inpatient Diabetes Coordinator Team Pager [phone] (8a-5p)

## 2020-12-14 NOTE — Plan of Care (Signed)

## 2020-12-14 NOTE — TOC Initial Note (Signed)
Transition of Care [facility]) - Initial/Assessment Note    Patient Details  Name: Paula Steele MRN: [id number] Date of Birth: October 20, 1946  Transition of Care [facility]) CM/SW Contact:    Amador Cunas, [place] Phone Number: 12/14/2020, 2:00 PM  Clinical Narrative: SW spoke with pt and pt's dtr Helene Kelp re PT recommendation for SNF. Pt with previous SNF stay at [facility] in 2014 which was a negative experience and pt does not want this facility again. Pt requesting a SNF in [facility] if possible. Will begin SNF search and f/u with offers once available.   Wandra Feinstein, MSW, LCSW [phone] (coverage)                      Expected Discharge Plan: Skilled Nursing Facility Barriers to Discharge: Continued Medical Work up,No SNF bed   Patient Goals and CMS Choice   CMS Medicare.gov Compare Post Acute Care list provided to:: Patient    Expected Discharge Plan and Services Expected Discharge Plan: [place]   Discharge Planning Services: CM Consult Post Acute Care Choice: [place] Living arrangements for the past 2 months: Single Family Home                                      Prior Living Arrangements/Services Living arrangements for the past 2 months: Single Family Home Lives with:: Adult Children Patient language and need for interpreter reviewed:: Yes Do you feel safe going back to the place where you live?: Yes      Need for Family Participation in Patient Care: Yes (Comment) Care giver support system in place?: Yes (comment) Current home services: DME Criminal Activity/Legal Involvement Pertinent to Current Situation/Hospitalization: No - Comment as needed  Activities of Daily Living Home Assistive Devices/Equipment: Cane (specify quad or straight) ADL Screening (condition at time of admission) Patient's cognitive ability adequate to safely complete daily activities?: Yes Is the patient deaf or have difficulty  hearing?: No Does the patient have difficulty seeing, even when wearing glasses/contacts?: No Does the patient have difficulty concentrating, remembering, or making decisions?: No Patient able to express need for assistance with ADLs?: Yes Does the patient have difficulty dressing or bathing?: No Independently performs ADLs?: Yes (appropriate for developmental age) Does the patient have difficulty walking or climbing stairs?: Yes Weakness of Legs: Right Weakness of Arms/Hands: None  Permission Sought/Granted Permission sought to share information with : Facility Art therapist granted to share information with : Yes, Verbal Permission Granted              Emotional Assessment Appearance:: Appears stated age Attitude/Demeanor/Rapport: Gracious Affect (typically observed): Accepting Orientation: : Oriented to Self,Oriented to Place,Oriented to  Time,Oriented to Situation Alcohol / Substance Use: Not Applicable Psych Involvement: No (comment)  Admission diagnosis:  Closed fracture of right distal femur ([place]) [S72.401A] Closed fracture of distal end of right femur, initial encounter [facility]) [S72.401A] Patient Active Problem List   Diagnosis Date Noted  . Closed fracture of right distal femur ([place]) 12/11/2020  . Acute urinary retention 12/11/2020  . Hypertension   . Shortness of breath 12/30/2013  . Hyperlipidemia 12/30/2013  . Acute encephalopathy 12/07/2012  . CVA (cerebral infarction) 12/07/2012  . OA (osteoarthritis) of knee 11/30/2012  . Acute medial meniscal tear 03/25/2012  . Personal history of colonic polyps 08/15/2011  . Abdominal pain, left lower quadrant 08/15/2011  . Cirrhosis of  liver without mention of alcohol 08/15/2011  . Obesity 08/15/2011  . 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 DM2 (diabetes mellitus, type 2) ([place]) 04/05/2008  . DEPRESSION 04/05/2008  . CAD 04/05/2008  . GERD 04/05/2008  . GASTROPARESIS 04/05/2008  . NASH  (nonalcoholic steatohepatitis) 04/05/2008  . [place] DISEASE 04/05/2008  . BENIGN NEOPLASM OF ADRENAL GLAND 03/31/2007  . DIVERTICULOSIS OF COLON 09/06/2004  . COLONIC POLYPS 02/07/2003   PCP:  Adaline Sill, NP Pharmacy:   [facility], [place] [address] Phone: [phone] Fax: [phone]  [facility] - [place], [place] [address] Phone: [phone] Fax: [phone]  [facility] DELIVERY - [place], [place] [address] [address] Phone: [phone] Fax: [phone]     Social Determinants of Health (SDOH) Interventions    Readmission Risk Interventions No flowsheet data found.

## 2020-12-14 NOTE — Evaluation (Signed)
Physical Therapy Evaluation Patient Details Name: Paula Steele MRN: [id number] DOB: [birth year] Today's Date: 12/14/2020   History of Present Illness  75 y.o. female with PMH of type 2 diabetes on insulin, hypertension, [place] cirrhosis, history of right knee replacement presented to the hospital with mechanical fall, tripped down a curb. ound to have periprosthetic right distal femoral fracture. Pt underwent ORIF R distal femur fx on 12/13/2020.  Clinical Impression  Pt presents to PT with deficits in functional mobility, gait, balance, strength, power, endurance, and with significant pain. Pt is generally weak but with more significant RLE weakness due to recent fx. Pt requires physical assistance for all functional mobility as well as verbal cues to maintain WB restrictions. Pt will benefit from continued acute PT POC to improve mobility quality, initiate gait training, and reduce falls risk. PT recommends SNF placement at this time.    Follow Up Recommendations SNF    Equipment Recommendations  Wheelchair (measurements PT);Wheelchair cushion (measurements PT)    Recommendations for Other Services       Precautions / Restrictions Precautions Precautions: Fall Restrictions Weight Bearing Restrictions: Yes RLE Weight Bearing: Partial weight bearing RLE Partial Weight Bearing Percentage or Pounds: 50      Mobility  Bed Mobility Overal bed mobility: Needs Assistance Bed Mobility: Supine to Sit     Supine to sit: Min assist;HOB elevated     General bed mobility comments: use of bed rail, assist for RLE mobility    Transfers Overall transfer level: Needs assistance Equipment used: Rolling walker (2 wheeled) Transfers: Sit to/from [facility] Sit to Stand: Min assist;+2 physical assistance Stand pivot transfers: Min assist;+2 physical assistance       General transfer comment: pt requries physical assistance to power into standing, verbal cues provided for  gait sequencing to maintain PWB  Ambulation/Gait                Stairs            Wheelchair Mobility    Modified Rankin (Stroke Patients Only)       Balance Overall balance assessment: Needs assistance Sitting-balance support: No upper extremity supported;Feet supported Sitting balance-Leahy Scale: Fair     Standing balance support: Bilateral upper extremity supported Standing balance-Leahy Scale: Poor Standing balance comment: minA with BUE support of RW                             Pertinent Vitals/Pain Pain Assessment: 0-10 Pain Score: 9  Pain Location: RLE Pain Descriptors / Indicators: Aching Pain Intervention(s): Patient requesting pain meds-RN notified    Home Living Family/patient expects to be discharged to:: Skilled nursing facility Living Arrangements: Children               Additional Comments: pt lives in mobile home with ramped entrance, 24/7 support from daughter    Prior Function Level of Independence: Independent with assistive device(s)         Comments: pt ambulates with use of cane for outdoor mobility, otherwise is independent in the home     Hand Dominance        Extremity/Trunk Assessment   Upper Extremity Assessment Upper Extremity Assessment: Generalized weakness    Lower Extremity Assessment Lower Extremity Assessment: Generalized weakness;RLE deficits/detail RLE Deficits / Details: RLE PF/DF 4-/5, hip flexion and knee flexion/extension 3-/5    Cervical / Trunk Assessment Cervical / Trunk Assessment: Kyphotic  Communication   Communication: No  difficulties  Cognition Arousal/Alertness: Awake/alert Behavior During Therapy: WFL for tasks assessed/performed Overall Cognitive Status: Within Functional Limits for tasks assessed                                        General Comments General comments (skin integrity, edema, etc.): VSS on RA    Exercises     Assessment/Plan     PT Assessment Patient needs continued PT services  PT Problem List Decreased strength;Decreased activity tolerance;Decreased balance;Decreased mobility;Decreased knowledge of use of DME;Decreased safety awareness;Decreased knowledge of precautions;Pain       PT Treatment Interventions DME instruction;Gait training;Functional mobility training;Therapeutic activities;Therapeutic exercise;Balance training;Neuromuscular re-education;Patient/family education;Cognitive remediation    PT Goals (Current goals can be found in the Care Plan section)  Acute Rehab PT Goals Patient Stated Goal: to improve mobility and reduce pain PT Goal Formulation: With patient Time For Goal Achievement: 12/28/20 Potential to Achieve Goals: Good Additional Goals Additional Goal #1: Pt will mobilize in manual wheelchair for 100' with supervision, maintaining PWB through RLE.    Frequency Min 3X/week   Barriers to discharge        Co-evaluation               AM-PAC PT "6 Clicks" Mobility  Outcome Measure Help needed turning from your back to your side while in a flat bed without using bedrails?: A Little Help needed moving from lying on your back to sitting on the side of a flat bed without using bedrails?: A Little Help needed moving to and from a bed to a chair (including a wheelchair)?: A Little Help needed standing up from a chair using your arms (e.g., wheelchair or bedside chair)?: A Little Help needed to walk in hospital room?: A Lot Help needed climbing 3-5 steps with a railing? : Total 6 Click Score: 15    End of Session Equipment Utilized During Treatment: Gait belt Activity Tolerance: Patient limited by pain Patient left: in chair;with call bell/phone within reach;with chair alarm set Nurse Communication: Mobility status;Need for lift equipment (use of STEDY) PT Visit Diagnosis: Other abnormalities of gait and mobility (R26.89);Muscle weakness (generalized) (M62.81);Pain Pain -  Right/Left: Right Pain - part of body: Leg    Time: 7915-0569 PT Time Calculation (min) (ACUTE ONLY): 28 min   Charges:   PT Evaluation $PT Eval Moderate Complexity: 1 Mod PT Treatments $Therapeutic Activity: 8-22 mins        Zenaida Niece, PT, DPT Acute Rehabilitation Pager: [phone]   Zenaida Niece 12/14/2020, 1:18 PM

## 2020-12-14 NOTE — Evaluation (Signed)
Occupational Therapy Evaluation Patient Details Name: Paula Steele MRN: [id number] DOB: [birth year] Today's Date: 12/14/2020    History of Present Illness 75 y.o. female with PMH of type 2 diabetes on insulin, hypertension, [place] cirrhosis, history of right knee replacement presented to the hospital with mechanical fall, tripped down a curb. ound to have periprosthetic right distal femoral fracture. Pt underwent ORIF R distal femur fx on 12/13/2020.   Clinical Impression   Pt presents with decline in function and safety with ADLs and ADL mobility with impaired strength, balance and endurance. Pt was Ind with ADLs/selfcare and used a cane for mobility PTA. Pt currently requires min guard A to sit EOB, mod A to stand with RW and transfer to [facility], max A with LB ADLs and toileting. Pt would benefit from acute OT services to address impairments to maximize level of function and safety    Follow Up Recommendations  SNF    Equipment Recommendations  Wheelchair (measurements OT);Wheelchair cushion (measurements OT)    Recommendations for Other Services       Precautions / Restrictions Precautions Precautions: Fall Restrictions Weight Bearing Restrictions: Yes RLE Weight Bearing: Partial weight bearing RLE Partial Weight Bearing Percentage or Pounds: 50%      Mobility Bed Mobility Overal bed mobility: Needs Assistance Bed Mobility: Supine to Sit;Sit to Supine     Supine to sit: Min guard Sit to supine: Min guard   General bed mobility comments: use of bed rail, no assist for RLE mobility    Transfers Overall transfer level: Needs assistance Equipment used: Rolling walker (2 wheeled) Transfers: Sit to/from [facility]   Stand pivot transfers: Mod assist            Balance Overall balance assessment: Needs assistance Sitting-balance support: No upper extremity supported;Feet supported Sitting balance-Leahy Scale: Fair     Standing balance support:  Bilateral upper extremity supported;During functional activity Standing balance-Leahy Scale: Poor                             ADL either performed or assessed with clinical judgement   ADL Overall ADL's : Needs assistance/impaired Eating/Feeding: Set up;Independent;Sitting   Grooming: Wash/dry hands;Wash/dry face;Set up;Supervision/safety;Sitting   Upper Body Bathing: Supervision/ safety;Set up;Sitting   Lower Body Bathing: Maximal assistance;Sitting/lateral leans;Sit to/from stand   Upper Body Dressing : Supervision/safety;Set up;Sitting   Lower Body Dressing: Total assistance   Toilet Transfer: Moderate assistance;RW;Stand-pivot;BSC;Cueing for safety   Toileting- Clothing Manipulation and Hygiene: Maximal assistance;Sit to/from stand       Functional mobility during ADLs: Moderate assistance;Rolling walker;Cueing for safety       Vision Baseline Vision/History: Wears glasses Patient Visual Report: No change from baseline       Perception     Praxis      Pertinent Vitals/Pain Pain Assessment: 0-10 Pain Score: 6  Pain Location: RLE Pain Descriptors / Indicators: Aching;Grimacing;Guarding Pain Intervention(s): Limited activity within patient's tolerance;Repositioned;Monitored during session     Hand Dominance Right   Extremity/Trunk Assessment Upper Extremity Assessment Upper Extremity Assessment: Generalized weakness   Lower Extremity Assessment Lower Extremity Assessment: Defer to PT evaluation   Cervical / Trunk Assessment Cervical / Trunk Assessment: Kyphotic   Communication Communication Communication: No difficulties   Cognition Arousal/Alertness: Awake/alert Behavior During Therapy: WFL for tasks assessed/performed Overall Cognitive Status: Within Functional Limits for tasks assessed  General Comments       Exercises     Shoulder Instructions      Home Living Family/patient  expects to be discharged to:: Skilled nursing facility Living Arrangements: Children                               Additional Comments: pt lives in mobile home with ramped entrance, 24/7 support from daughter      Prior Functioning/Environment Level of Independence: Independent with assistive device(s)        Comments: pt ambulates with use of cane for outdoor mobility, otherwise is independent in the home        OT Problem List: Decreased strength;Impaired balance (sitting and/or standing);Pain;Decreased activity tolerance;Decreased knowledge of use of DME or AE      OT Treatment/Interventions: Self-care/ADL training;DME and/or AE instruction;Therapeutic activities;Balance training;Patient/family education    OT Goals(Current goals can be found in the care plan section) Acute Rehab OT Goals Patient Stated Goal: be Ind again OT Goal Formulation: With patient/family Time For Goal Achievement: 12/28/20 Potential to Achieve Goals: Good ADL Goals Pt Will Perform Grooming: with min guard assist;standing Pt Will Perform Upper Body Bathing: with set-up;sitting Pt Will Perform Lower Body Bathing: with mod assist;sitting/lateral leans;sit to/from stand Pt Will Perform Upper Body Dressing: with set-up;sitting Pt Will Transfer to Toilet: with min assist;with min guard assist;stand pivot transfer;ambulating;bedside commode Pt Will Perform Toileting - Clothing Manipulation and hygiene: with min assist;sit to/from stand  OT Frequency: Min 2X/week   Barriers to D/C:            Co-evaluation              AM-PAC OT "6 Clicks" Daily Activity     Outcome Measure Help from another person eating meals?: None Help from another person taking care of personal grooming?: A Little Help from another person toileting, which includes using toliet, bedpan, or urinal?: A Lot Help from another person bathing (including washing, rinsing, drying)?: A Lot Help from another person to  put on and taking off regular upper body clothing?: A Little Help from another person to put on and taking off regular lower body clothing?: A Lot 6 Click Score: 16   End of Session Equipment Utilized During Treatment: Gait belt;Rolling walker;Other (comment) (BSC)  Activity Tolerance: Patient tolerated treatment well Patient left: in bed;with call bell/phone within reach;with bed alarm set;with family/visitor present  OT Visit Diagnosis: Unsteadiness on feet (R26.81);Other abnormalities of gait and mobility (R26.89);Muscle weakness (generalized) (M62.81);History of falling (Z91.81);Pain Pain - Right/Left: Right Pain - part of body: Leg                Time: 6295-2841 OT Time Calculation (min): 26 min Charges:  OT General Charges $OT Visit: 1 Visit OT Evaluation $OT Eval Moderate Complexity: 1 Mod OT Treatments $Self Care/Home Management : 8-22 mins    Britt Bottom 12/14/2020, 4:41 PM

## 2020-12-14 NOTE — Progress Notes (Addendum)
Orthopaedic Trauma Progress Note  SUBJECTIVE: Reports moderate pain about operative site this morning. States she is on Percocet 10-325 TID PRN at home at baseline.This has been started and I have increased this to q 6 hours PRN. I will also schedule her Toradol for better pain control.  Patient asking for her Lyrica to be given at 0800 and 2000, as this is her schedule at home and helps her to sleep better. I have made that adjustment. No chest pain. No SOB. No nausea/vomiting. No other complaints.   OBJECTIVE:  Vitals:   12/13/20 1501 12/13/20 1931  BP: 126/60 128/61  Pulse: 96 97  Resp: 16 17  Temp: (!) 97.4 F (36.3 C) 97.6 F (36.4 C)  SpO2: 98% 98%    General: Sitting up in bed, NAD Respiratory: No increased work of breathing.  Right Lower Extremity: Dressing CDI. Tender with palpation over knee and distal thigh as expected. Tolerates minimal knee motion secondary to pain. Ankle dorsiflexion/plantarflexion intact. Motor and sensory function intact. Compartments soft and compressible.  +DP pulse  IMAGING: Stable post op imaging.   LABS:  Results for orders placed or performed during the hospital encounter of 12/10/20 (from the past 24 hour(s))  Glucose, capillary     Status: Abnormal   Collection Time: 12/13/20  9:43 AM  Result Value Ref Range   Glucose-Capillary 178 (H) 70 - 99 mg/dL  Glucose, capillary     Status: Abnormal   Collection Time: 12/13/20 11:53 AM  Result Value Ref Range   Glucose-Capillary 215 (H) 70 - 99 mg/dL  CBC     Status: Abnormal   Collection Time: 12/13/20 12:45 PM  Result Value Ref Range   WBC 7.9 4.0 - 10.5 K/uL   RBC 3.73 (L) 3.87 - 5.11 MIL/uL   Hemoglobin 10.9 (L) 12.0 - 15.0 g/dL   HCT 32.1 (L) 36.0 - 46.0 %   MCV 86.1 80.0 - 100.0 fL   MCH 29.2 26.0 - 34.0 pg   MCHC 34.0 30.0 - 36.0 g/dL   RDW 14.4 11.5 - 15.5 %   Platelets 142 (L) 150 - 400 K/uL   nRBC 0.0 0.0 - 0.2 %  Creatinine, serum     Status: None   Collection Time: 12/13/20  12:45 PM  Result Value Ref Range   Creatinine, Ser 0.60 0.44 - 1.00 mg/dL   GFR, Estimated >60 >60 mL/min  Glucose, capillary     Status: Abnormal   Collection Time: 12/13/20  5:11 PM  Result Value Ref Range   Glucose-Capillary 399 (H) 70 - 99 mg/dL  Glucose, capillary     Status: Abnormal   Collection Time: 12/13/20  7:31 PM  Result Value Ref Range   Glucose-Capillary 357 (H) 70 - 99 mg/dL  Glucose, capillary     Status: Abnormal   Collection Time: 12/14/20 12:17 AM  Result Value Ref Range   Glucose-Capillary 238 (H) 70 - 99 mg/dL  VITAMIN D 25 Hydroxy (Vit-D Deficiency, Fractures)     Status: None   Collection Time: 12/14/20  2:03 AM  Result Value Ref Range   Vit D, 25-Hydroxy 48.18 30 - 100 ng/mL  Basic metabolic panel     Status: Abnormal   Collection Time: 12/14/20  2:03 AM  Result Value Ref Range   Sodium 137 135 - 145 mmol/L   Potassium 4.2 3.5 - 5.1 mmol/L   Chloride 105 98 - 111 mmol/L   CO2 24 22 - 32 mmol/L   Glucose, Bld 219 (  H) 70 - 99 mg/dL   BUN 18 8 - 23 mg/dL   Creatinine, Ser 0.60 0.44 - 1.00 mg/dL   Calcium 8.6 (L) 8.9 - 10.3 mg/dL   GFR, Estimated >60 >60 mL/min   Anion gap 8 5 - 15  CBC     Status: Abnormal   Collection Time: 12/14/20  2:03 AM  Result Value Ref Range   WBC 7.1 4.0 - 10.5 K/uL   RBC 3.16 (L) 3.87 - 5.11 MIL/uL   Hemoglobin 9.3 (L) 12.0 - 15.0 g/dL   HCT 26.9 (L) 36.0 - 46.0 %   MCV 85.1 80.0 - 100.0 fL   MCH 29.4 26.0 - 34.0 pg   MCHC 34.6 30.0 - 36.0 g/dL   RDW 14.0 11.5 - 15.5 %   Platelets 137 (L) 150 - 400 K/uL   nRBC 0.0 0.0 - 0.2 %  Glucose, capillary     Status: Abnormal   Collection Time: 12/14/20  4:41 AM  Result Value Ref Range   Glucose-Capillary 179 (H) 70 - 99 mg/dL    ASSESSMENT: Paula Steele is a 75 y.o. female, 1 Day Post-Op s/p ORIF RIGHT DISTAL FEMUR FRACTURE  CV/Blood loss: Acute blood loss anemia, Hgb 9.3 this AM. Hemodynamically stable  PLAN: Weightbearing: PWB 50% RLE Incisional and dressing care:  Reinforce dressings as needed. Plan to change tomorrow Showering: TBD when dressing removed Orthopedic device(s): None  Pain management:  1. Tylenol 325-650 mg q 6 hours PRN 2. Zanaflex 4 mg q 6 hours PRN 3. Percocet 10-325 mg q 6 hours PRN 4. Lyrica 50 mg BID 5. Dilaudid 0.5-1 mg q 4 hours PRN 6. Toradol 15 mg q 6 hours x 5 doses  VTE prophylaxis: Lovenox, SCDs ID:  Ancef 2gm post op Foley/Lines: No foley, KVO IVFs Impediments to Fracture Healing: Vit D level looks great at 48, no supplementation needed Dispo: PT/OT eval today, dispo pending. Have made adjustments to pain medication, will see how this does and make additional changes as needed Follow - up plan: 2 weeks after discharge for repeat x-rays and wound check  Contact information:  Katha Hamming MD, Patrecia Pace PA-C. After hours and holidays please check [email or web address] for group call information for [facility]   Zephyra Bernardi A. Ricci Barker, PA-C [phone] (office) [email or web address]

## 2020-12-14 NOTE — NC FL2 (Signed)
[place] LEVEL OF CARE SCREENING TOOL     IDENTIFICATION  Patient Name: Paula Steele Birthdate: 1946/09/27 Sex: female Admission Date (Current Location): 12/10/2020  [facility] and [place] Number:  Herbalist and Address:  [facility], [place] [address], [place], [place] [address]      Provider Number: [id number]  Attending Physician Name and Address:  Barb Merino, MD  Relative Name and Phone Number:       Current Level of Care: Hospital Recommended Level of Care: Price Prior Approval Number:    Date Approved/Denied:   PASRR Number: [id number]  Discharge Plan: SNF    Current Diagnoses: Patient Active Problem List   Diagnosis Date Noted  . Closed fracture of right distal femur ([place]) 12/11/2020  . Acute urinary retention 12/11/2020  . Hypertension   . Shortness of breath 12/30/2013  . Hyperlipidemia 12/30/2013  . Acute encephalopathy 12/07/2012  . CVA (cerebral infarction) 12/07/2012  . OA (osteoarthritis) of knee 11/30/2012  . Acute medial meniscal tear 03/25/2012  . Personal history of colonic polyps 08/15/2011  . Abdominal pain, left lower quadrant 08/15/2011  . Cirrhosis of liver without mention of alcohol 08/15/2011  . Obesity 08/15/2011  . 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 DM2 (diabetes mellitus, type 2) ([place]) 04/05/2008  . DEPRESSION 04/05/2008  . CAD 04/05/2008  . GERD 04/05/2008  . GASTROPARESIS 04/05/2008  . NASH (nonalcoholic steatohepatitis) 04/05/2008  . [place] DISEASE 04/05/2008  . BENIGN NEOPLASM OF ADRENAL GLAND 03/31/2007  . DIVERTICULOSIS OF COLON 09/06/2004  . COLONIC POLYPS 02/07/2003    Orientation RESPIRATION BLADDER Height & Weight     Self,Time,Situation,Place  Normal Continent Weight:   Height:     BEHAVIORAL SYMPTOMS/MOOD NEUROLOGICAL BOWEL NUTRITION STATUS      Continent    AMBULATORY STATUS COMMUNICATION OF NEEDS Skin   Extensive  Assist Verbally Surgical wounds                       Personal Care Assistance Level of Assistance  Bathing,Dressing Bathing Assistance: Limited assistance   Dressing Assistance: Limited assistance     Functional Limitations Info  Sight,Hearing,Speech Sight Info: Impaired Hearing Info: Adequate Speech Info: Adequate    SPECIAL CARE FACTORS FREQUENCY  PT (By licensed PT),OT (By licensed OT)                    Contractures Contractures Info: Not present    Additional Factors Info                  Current Medications (12/14/2020):  This is the current hospital active medication list Current Facility-Administered Medications  Medication Dose Route Frequency Provider Last Rate Last Admin  . acetaminophen (TYLENOL) tablet 325-650 mg  325-650 mg Oral Q6H PRN Delray Alt, PA-C   650 mg at 12/14/20 1132  . albuterol (VENTOLIN HFA) 108 (90 Base) MCG/ACT inhaler 2 puff  2 puff Inhalation Q6H PRN Delray Alt, PA-C      . Chlorhexidine Gluconate Cloth 2 % PADS 6 each  6 each Topical Daily Delray Alt, PA-C   6 each at 12/14/20 9201  . diazepam (VALIUM) tablet 5 mg  5 mg Oral Q8H PRN Patrecia Pace A, PA-C   5 mg at 12/13/20 1007  . docusate sodium (COLACE) capsule 100 mg  100 mg Oral BID Patrecia Pace A, PA-C   100 mg at 12/14/20 0071  .  enoxaparin (LOVENOX) injection 40 mg  40 mg Subcutaneous Q24H Patrecia Pace A, PA-C   40 mg at 12/14/20 7425  . feeding supplement (GLUCERNA SHAKE) (GLUCERNA SHAKE) liquid 237 mL  237 mL Oral TID BM Patrecia Pace A, PA-C   237 mL at 12/12/20 2011  . fluticasone (FLONASE) 50 MCG/ACT nasal spray 1 spray  1 spray Each Nare Daily Delray Alt, PA-C   1 spray at 12/14/20 0804  . HYDROmorphone (DILAUDID) injection 0.5-1 mg  0.5-1 mg Intravenous Q4H PRN Delray Alt, PA-C   1 mg at 12/14/20 1224  . insulin aspart (novoLOG) injection 0-9 Units  0-9 Units Subcutaneous Q4H Delray Alt, PA-C   3 Units at 12/14/20 1136  . insulin  glargine (LANTUS) injection 21 Units  21 Units Subcutaneous Q1400 Delray Alt, PA-C   21 Units at 12/14/20 1328  . ketorolac (TORADOL) 15 MG/ML injection 15 mg  15 mg Intravenous Q6H Patrecia Pace A, PA-C   15 mg at 12/14/20 1132  . lactated ringers infusion   Intravenous Continuous Delray Alt, PA-C 10 mL/hr at 12/13/20 9563 Continued from Pre-op at 12/13/20 0806  . metoCLOPramide (REGLAN) tablet 5-10 mg  5-10 mg Oral Q8H PRN Patrecia Pace A, PA-C       Or  . metoCLOPramide (REGLAN) injection 5-10 mg  5-10 mg Intravenous Q8H PRN Delray Alt, PA-C      . mometasone-formoterol (DULERA) 200-5 MCG/ACT inhaler 2 puff  2 puff Inhalation BID Patrecia Pace A, PA-C   2 puff at 12/14/20 0804  . montelukast (SINGULAIR) tablet 10 mg  10 mg Oral QHS Patrecia Pace A, PA-C   10 mg at 12/13/20 2120  . multivitamin with minerals tablet 1 tablet  1 tablet Oral Daily Delray Alt, PA-C   1 tablet at 12/14/20 8756  . ondansetron (ZOFRAN) tablet 4 mg  4 mg Oral Q6H PRN Patrecia Pace A, PA-C       Or  . ondansetron [facility]) injection 4 mg  4 mg Intravenous Q6H PRN Delray Alt, PA-C      . oxyCODONE-acetaminophen (PERCOCET/ROXICET) 5-325 MG per tablet 1 tablet  1 tablet Oral Q6H PRN Delray Alt, PA-C       And  . oxyCODONE (Oxy IR/ROXICODONE) immediate release tablet 5 mg  5 mg Oral Q6H PRN Patrecia Pace A, PA-C   5 mg at 12/14/20 1132  . pantoprazole (PROTONIX) EC tablet 40 mg  40 mg Oral Daily Patrecia Pace A, PA-C   40 mg at 12/14/20 4332  . polyethylene glycol (MIRALAX / GLYCOLAX) packet 17 g  17 g Oral Daily PRN Delray Alt, PA-C      . pregabalin (LYRICA) capsule 50 mg  50 mg Oral BID Yacobi, Sarah A, PA-C      . tiZANidine (ZANAFLEX) tablet 4 mg  4 mg Oral Q6H PRN Delray Alt, PA-C      . vortioxetine HBr (TRINTELLIX) tablet 10 mg  10 mg Oral Daily Delray Alt, PA-C   10 mg at 12/14/20 0803     Discharge Medications: Please see discharge summary for a list of discharge  medications.  Relevant Imaging Results:  Relevant Lab Results:   Additional Information Not COVID vaccinated. SS# [id number]  Amador Cunas, LCSW

## 2020-12-15 DIAGNOSIS — S72401A Unspecified fracture of lower end of right femur, initial encounter for closed fracture: Secondary | ICD-10-CM | POA: Diagnosis not present

## 2020-12-15 DIAGNOSIS — Z794 Long term (current) use of insulin: Secondary | ICD-10-CM | POA: Diagnosis not present

## 2020-12-15 DIAGNOSIS — E1165 Type 2 diabetes mellitus with hyperglycemia: Secondary | ICD-10-CM

## 2020-12-15 LAB — BASIC METABOLIC PANEL
Anion gap: 8 (ref 5–15)
BUN: 21 mg/dL (ref 8–23)
CO2: 26 mmol/L (ref 22–32)
Calcium: 9 mg/dL (ref 8.9–10.3)
Chloride: 104 mmol/L (ref 98–111)
Creatinine, Ser: 0.67 mg/dL (ref 0.44–1.00)
GFR, Estimated: >60 mL/min (ref >60)
Glucose, Bld: 283 mg/dL — ABNORMAL HIGH (ref 70–99)
Potassium: 4.2 mmol/L (ref 3.5–5.1)
Sodium: 138 mmol/L (ref 135–145)

## 2020-12-15 LAB — CBC
HCT: 27.3 % — ABNORMAL LOW (ref 36.0–46.0)
Hemoglobin: 8.8 g/dL — ABNORMAL LOW (ref 12.0–15.0)
MCH: 28.3 pg (ref 26.0–34.0)
MCHC: 32.2 g/dL (ref 30.0–36.0)
MCV: 87.8 fL (ref 80.0–100.0)
Platelets: 129 10*3/uL — ABNORMAL LOW (ref 150–400)
RBC: 3.11 MIL/uL — ABNORMAL LOW (ref 3.87–5.11)
RDW: 14.4 % (ref 11.5–15.5)
WBC: 5.4 10*3/uL (ref 4.0–10.5)
nRBC: 0 % (ref 0.0–0.2)

## 2020-12-15 LAB — GLUCOSE, CAPILLARY
Glucose-Capillary: 273 mg/dL — ABNORMAL HIGH (ref 70–99)
Glucose-Capillary: 242 mg/dL — ABNORMAL HIGH (ref 70–99)
Glucose-Capillary: 314 mg/dL — ABNORMAL HIGH (ref 70–99)
Glucose-Capillary: 223 mg/dL — ABNORMAL HIGH (ref 70–99)
Glucose-Capillary: 235 mg/dL — ABNORMAL HIGH (ref 70–99)

## 2020-12-15 MED ORDER — PREGABALIN 50 MG PO CAPS: 50.0000 mg | ORAL_CAPSULE | Freq: Two times a day (BID) | ORAL | 0 refills | Status: DC

## 2020-12-15 MED ORDER — ACETAMINOPHEN 325 MG PO TABS: 325.0000 mg | ORAL_TABLET | Freq: Four times a day (QID) | ORAL | Status: AC | PRN

## 2020-12-15 MED ORDER — OXYCODONE-ACETAMINOPHEN 10-325 MG PO TABS: 1.0000 | ORAL_TABLET | Freq: Four times a day (QID) | ORAL | 0 refills | Status: AC | PRN

## 2020-12-15 MED ORDER — TIZANIDINE HCL 4 MG PO TABS: 4.0000 mg | ORAL_TABLET | Freq: Four times a day (QID) | ORAL | 0 refills | Status: DC | PRN

## 2020-12-15 MED ORDER — ENOXAPARIN SODIUM 40 MG/0.4ML ~~LOC~~ SOLN: 40.0000 mg | SUBCUTANEOUS | 0 refills | Status: DC

## 2020-12-15 MED ORDER — DIAZEPAM 10 MG PO TABS: 5.0000 mg | ORAL_TABLET | Freq: Three times a day (TID) | ORAL | 0 refills | Status: DC | PRN

## 2020-12-15 MED ORDER — INSULIN ASPART 100 UNIT/ML ~~LOC~~ SOLN: 0.0000 [IU] | Freq: Three times a day (TID) | SUBCUTANEOUS | 11 refills | Status: DC

## 2020-12-15 MED ORDER — DOCUSATE SODIUM 100 MG PO CAPS: 100.0000 mg | ORAL_CAPSULE | Freq: Two times a day (BID) | ORAL | 0 refills | Status: DC

## 2020-12-15 MED ORDER — INSULIN GLARGINE 100 UNIT/ML ~~LOC~~ SOLN
26.0000 [IU] | Freq: Every day | SUBCUTANEOUS | Status: DC
  Administered 2020-12-15 – 2020-12-17 (×3): 26 [IU] via SUBCUTANEOUS
  Filled 2020-12-15 (×6): qty 0.26

## 2020-12-15 NOTE — Discharge Instructions (Signed)
Orthopaedic Trauma Service Discharge Instructions   General Discharge Instructions  WEIGHT BEARING STATUS: Partial weightbearing 50% on right lower extremity  RANGE OF MOTION/ACTIVITY: Ok for knee motion as tolerated  Wound Care: Incisions can be left open to air if there is no drainage. If incision continues to have drainage, follow wound care instructions below. Okay to shower if no drainage from incisions.  DVT/PE prophylaxis: Lovenox  Diet: as you were eating previously.  Can use over the counter stool softeners and bowel preparations, such as Miralax, to help with bowel movements.  Narcotics can be constipating.  Be sure to drink plenty of fluids  PAIN MEDICATION USE AND EXPECTATIONS  You have likely been given narcotic medications to help control your pain.  After a traumatic event that results in an fracture (broken bone) with or without surgery, it is ok to use narcotic pain medications to help control one's pain.  We understand that everyone responds to pain differently and each individual patient will be evaluated on a regular basis for the continued need for narcotic medications. Ideally, narcotic medication use should last no more than 6-8 weeks (coinciding with fracture healing).   As a patient it is your responsibility as well to monitor narcotic medication use and report the amount and frequency you use these medications when you come to your office visit.   We would also advise that if you are using narcotic medications, you should take a dose prior to therapy to maximize you participation.  IF YOU ARE ON NARCOTIC MEDICATIONS IT IS NOT PERMISSIBLE TO OPERATE A MOTOR VEHICLE (MOTORCYCLE/CAR/TRUCK/MOPED) OR HEAVY MACHINERY DO NOT MIX NARCOTICS WITH OTHER CNS (CENTRAL NERVOUS SYSTEM) DEPRESSANTS SUCH AS ALCOHOL   STOP SMOKING OR USING NICOTINE PRODUCTS!!!!  As discussed nicotine severely impairs your body's ability to heal surgical and traumatic wounds but also impairs bone  healing.  Wounds and bone heal by forming microscopic blood vessels (angiogenesis) and nicotine is a vasoconstrictor (essentially, shrinks blood vessels).  Therefore, if vasoconstriction occurs to these microscopic blood vessels they essentially disappear and are unable to deliver necessary nutrients to the healing tissue.  This is one modifiable factor that you can do to dramatically increase your chances of healing your injury.    (This means no smoking, no nicotine gum, patches, etc)  DO NOT USE NONSTEROIDAL ANTI-INFLAMMATORY DRUGS (NSAID'S)  Using products such as Advil (ibuprofen), Aleve (naproxen), Motrin (ibuprofen) for additional pain control during fracture healing can delay and/or prevent the healing response.  If you would like to take over the counter (OTC) medication, Tylenol (acetaminophen) is ok.  However, some narcotic medications that are given for pain control contain acetaminophen as well. Therefore, you should not exceed more than 4000 mg of tylenol in a day if you do not have liver disease.  Also note that there are may OTC medicines, such as cold medicines and allergy medicines that my contain tylenol as well.  If you have any questions about medications and/or interactions please ask your doctor/PA or your pharmacist.      ICE AND ELEVATE INJURED/OPERATIVE EXTREMITY  Using ice and elevating the injured extremity above your heart can help with swelling and pain control.  Icing in a pulsatile fashion, such as 20 minutes on and 20 minutes off, can be followed.    Do not place ice directly on skin. Make sure there is a barrier between to skin and the ice pack.    Using frozen items such as frozen peas works well  as the conform nicely to the are that needs to be iced.  USE AN ACE WRAP OR TED HOSE FOR SWELLING CONTROL  In addition to icing and elevation, Ace wraps or TED hose are used to help limit and resolve swelling.  It is recommended to use Ace wraps or TED hose until you are  informed to stop.    When using Ace Wraps start the wrapping distally (farthest away from the body) and wrap proximally (closer to the body)   Example: If you had surgery on your leg or thing and you do not have a splint on, start the ace wrap at the toes and work your way up to the thigh        If you had surgery on your upper extremity and do not have a splint on, start the ace wrap at your fingers and work your way up to the upper arm   [place]: [phone]   VISIT OUR WEBSITE FOR ADDITIONAL INFORMATION: [email or web address]    Discharge Wound Care Instructions  Do NOT apply any ointments, solutions or lotions to pin sites or surgical wounds.  These prevent needed drainage and even though solutions like hydrogen peroxide kill bacteria, they also damage cells lining the pin sites that help fight infection.  Applying lotions or ointments can keep the wounds moist and can cause them to breakdown and open up as well. This can increase the risk for infection. When in doubt call the office.  If any drainage is noted, use one layer of adaptic, then gauze, Kerlix, and an ace wrap.  Once the incision is completely dry and without drainage, it may be left open to air out.  Showering may begin 36-48 hours later.  Cleaning gently with soap and water.  Traumatic wounds should be dressed daily as well.    One layer of adaptic, gauze, Kerlix, then ace wrap.  The adaptic can be discontinued once the draining has ceased    If you have a wet to dry dressing: wet the gauze with saline the squeeze as much saline out so the gauze is moist (not soaking wet), place moistened gauze over wound, then place a dry gauze over the moist one, followed by Kerlix wrap, then ace wrap.

## 2020-12-15 NOTE — Care Management Important Message (Signed)
Important Message  Patient Details  Name: Paula Steele MRN: [id number] Date of Birth: 1946-01-30   Medicare Important Message Given:  Yes     Barb Merino [place] 12/15/2020, 2:46 PM

## 2020-12-15 NOTE — Discharge Summary (Signed)
Physician Discharge Summary  Paula Steele IDH:[id number] DOB: 01-26-1946 DOA: 12/10/2020  PCP: Adaline Sill, NP  Admit date: 12/10/2020 Discharge date: 12/15/2020  Admitted From: Home Disposition: Skilled nursing facility  Recommendations for Outpatient Follow-up:  1. Follow up with PCP in 1-2 weeks 2. Please obtain BMP/CBC in one week after discharge 3. Schedule follow-up with orthopedics after discharge  Home Health: SNF Equipment/Devices: SNF  Discharge Condition: Stable CODE STATUS: Full code Diet recommendation: Low-salt, low-carb diet  Discharge summary: Patient with history of type 2 diabetes on insulin, hypertension, [place] cirrhosis, history of right knee replacement presented to the hospital with mechanical fall, tripped down a curb.  Severe right pain.  Found to have periprosthetic right distal femoral fracture.  Admitted for operative intervention. 2/23, underwent ORIF.   Assessment & Plan of care:   Closed periprosthetic right distal femoral fracture: With total knee in place S/p ORIF 2/23 Adequate pain medications along with laxatives, uses percocet at home . Changed to every 6 hours as needed. lovenox for DVT prophylaxis. 50% weight bearing right leg Work with PT OT.  Refer to a skilled living facility for inpatient therapies. Patient is on chronic pain medication at home with Percocet 10/325 3 times a day. Currently on Percocet 10 mg every 6 hours. She is also on Lyrica at home.  Has chronic pain management with her primary care provider.  Type 2 diabetes: Resume home doses of Ozempic and [place].  Also given sliding scale insulin.  Poorly controlled.  NASH cirrhosis: Stable.  Liver test normal.  Hypertension: Blood pressure stable on current regimen.  Urinary retention: Due to acute fracture, pain and immobility.    Able to void now.  Patient is medically stabilized.  Surgically stable as per surgery.  Able to go to skilled nursing facility  when bed is available.   Discharge Diagnoses:  Principal Problem:   Closed fracture of right distal femur ([place]) Active Problems:   DM2 (diabetes mellitus, type 2) (HCC)   NASH (nonalcoholic steatohepatitis)   Obesity   Hypertension   Acute urinary retention    Discharge Instructions  Discharge Instructions    Call MD for:  redness, tenderness, or signs of infection (pain, swelling, redness, odor or green/yellow discharge around incision site)   Complete by: As directed    Call MD for:  severe uncontrolled pain   Complete by: As directed    Diet - low sodium heart healthy   Complete by: As directed    Diet Carb Modified   Complete by: As directed    Increase activity slowly   Complete by: As directed    No dressing needed   Complete by: As directed    Other Restrictions   Complete by: As directed    50% weightbearing right leg.     Allergies as of 12/15/2020      Reactions   Actos [pioglitazone]    edema   Bydureon [exenatide]    nausea   Metformin And Related    diahrrhea      Medication List    STOP taking these medications   diazepam 10 MG tablet Commonly known as: VALIUM   MOBIC PO   QUEtiapine 25 MG tablet Commonly known as: SEROQUEL     TAKE these medications   acetaminophen 325 MG tablet Commonly known as: TYLENOL Take 1-2 tablets (325-650 mg total) by mouth every 6 (six) hours as needed for mild pain (pain score 1-3 or temp > 100.5).   albuterol  108 (90 Base) MCG/ACT inhaler Commonly known as: VENTOLIN HFA Inhale 2 puffs into the lungs every 6 (six) hours as needed for wheezing or shortness of breath.   celecoxib 100 MG capsule Commonly known as: CELEBREX Take 100 mg by mouth at bedtime.   diclofenac Sodium 1 % Gel Commonly known as: VOLTAREN Apply 2-4 g topically daily as needed (pain).   docusate sodium 100 MG capsule Commonly known as: COLACE Take 1 capsule (100 mg total) by mouth 2 (two) times daily.   enoxaparin 40 MG/0.4ML  injection Commonly known as: LOVENOX Inject 0.4 mLs (40 mg total) into the skin daily. Start taking on: December 16, 2020   fluticasone 50 MCG/ACT nasal spray Commonly known as: FLONASE Place 1 spray into both nostrils daily.   fluticasone-salmeterol 115-21 MCG/ACT inhaler Commonly known as: ADVAIR HFA Inhale 2 puffs into the lungs daily.   glucose blood test strip Commonly known as: Accu-Chek Guide Use as instructed   insulin aspart 100 UNIT/ML injection Commonly known as: novoLOG Inject 0-9 Units into the skin 3 (three) times daily with meals.   montelukast 10 MG tablet Commonly known as: SINGULAIR Take 10 mg by mouth at bedtime.   Narcan 4 MG/0.1ML Liqd nasal spray kit Generic drug: naloxone 1 spray once.   oxyCODONE-acetaminophen 10-325 MG tablet Commonly known as: PERCOCET Take 1 tablet by mouth every 6 (six) hours as needed for up to 5 days for pain. What changed: when to take this   Ozempic (1 MG/DOSE) 4 MG/3ML Sopn Generic drug: Semaglutide (1 MG/DOSE) Inject 1 mg into the skin every _0 /25/22 1103          Follow-up Information    Haddix, Thomasene Lot, MD. Schedule an appointment as soon as possible for a visit in 2 week(s).   Specialty: Orthopedic Surgery Why: for suture removal, repeat x-rays Contact information: [place] [address] [phone]              Allergies  Allergen Reactions  . Actos [Pioglitazone]     edema  . Bydureon [Exenatide]     nausea  . Metformin And Related     diahrrhea    Consultations:  Orthopedics  Procedures/Studies: DG Lumbar Spine Complete  Result Date: 12/11/2020 CLINICAL DATA:  Fall with back pain. EXAM: LUMBAR SPINE - COMPLETE 4+ VIEW COMPARISON:  10/08/2017 FINDINGS: No acute lumbar region finding. Five lumbar type vertebral bodies in normal alignment. Chronic disc space narrowing L2-3 through L4-5. Lower lumbar facet osteoarthritis. Cannot rule out a minimal superior endplate fracture at O11, but this area was not well evaluated on the lumbar study. Is the pain higher, in the lower thoracic region? IMPRESSION: 1. No acute lumbar region finding. Chronic disc space narrowing L2-3 through L4-5. Lower lumbar facet osteoarthritis. 2. Cannot rule out a minimal T11 superior endplate fracture. Is the patient's pain in the lower thoracic region? Electronically Signed   By: Nelson Chimes M.D.   On: 12/11/2020 00:28   CT Head Wo Contrast  Result Date: 12/10/2020 CLINICAL DATA:  Trauma to the head and neck. EXAM: CT HEAD WITHOUT CONTRAST CT CERVICAL SPINE WITHOUT CONTRAST TECHNIQUE: Multidetector CT imaging of the head and cervical spine was performed following the standard protocol without intravenous contrast. Multiplanar CT image reconstructions of the cervical spine were also generated. COMPARISON:  12/06/2012.  03/16/2020. FINDINGS: CT HEAD FINDINGS  Brain: Age related atrophy. Ordinary chronic small-vessel ischemic changes of the hemispheric white matter. No sign of acute infarction, mass lesion, hemorrhage, hydrocephalus or extra-axial collection. Vascular: There is atherosclerotic calcification of the major vessels at the base of the brain. Skull: Negative Sinuses/Orbits: Clear/normal Other: None CT CERVICAL SPINE FINDINGS Alignment: Normal Skull base and vertebrae: No fracture or primary bone lesion. Soft tissues and spinal canal: No significant soft tissue finding. Disc levels: Minimal mid cervical spondylosis. No significant narrowing of the canal or foramina identified. Mild facet osteoarthritis on the right at C2-3 and C3-4. Upper chest: Negative Other: None IMPRESSION: HEAD CT: No acute or traumatic finding. Age related atrophy and chronic small-vessel ischemic changes. CERVICAL SPINE CT: No acute or traumatic finding. Minimal spondylosis and facet osteoarthritis. Electronically Signed   By: Nelson Chimes M.D.   On: 12/10/2020 23:53   CT Cervical Spine Wo Contrast  Result Date: 12/10/2020 CLINICAL DATA:  Trauma to the head and neck. EXAM: CT HEAD WITHOUT CONTRAST CT CERVICAL SPINE WITHOUT CONTRAST TECHNIQUE: Multidetector CT imaging of the head and cervical spine was performed following the standard protocol without intravenous contrast. Multiplanar CT image reconstructions of the cervical spine were also generated. COMPARISON:  12/06/2012.  03/16/2020. FINDINGS: CT HEAD FINDINGS Brain: Age related atrophy. Ordinary chronic small-vessel ischemic changes of the hemispheric white matter. No sign of acute infarction, mass lesion, hemorrhage, hydrocephalus or extra-axial collection. Vascular: There is atherosclerotic calcification of the major vessels at the base of the brain. Skull: Negative Sinuses/Orbits: Clear/normal Other: None CT CERVICAL SPINE FINDINGS Alignment: Normal Skull base and vertebrae: No fracture or primary bone lesion. Soft tissues and  spinal canal: No significant soft tissue finding. Disc levels: Minimal mid cervical spondylosis. No significant narrowing of the canal or foramina identified. Mild facet osteoarthritis on the right at C2-3 and C3-4. Upper chest: Negative Other: None IMPRESSION: HEAD CT: No acute or traumatic finding. Age related atrophy and chronic small-vessel ischemic changes. CERVICAL SPINE CT: No acute or traumatic finding. Minimal spondylosis and facet osteoarthritis. Electronically Signed   By: Nelson Chimes M.D.   On: 12/10/2020 23:53   DG Chest Port 1 View  Result Date: 12/11/2020 CLINICAL DATA:  Preoperative respiratory exam. EXAM: PORTABLE CHEST 1 VIEW COMPARISON:  April 19, 2020 FINDINGS: The heart size and mediastinal contours are within normal  limits. Both lungs are clear. The visualized skeletal structures are unremarkable. IMPRESSION: No active cardiopulmonary disease. Electronically Signed   By: Virgina Norfolk M.D.   On: 12/11/2020 02:32   DG Knee Right Port  Result Date: 12/13/2020 CLINICAL DATA:  ORIF. EXAM: PORTABLE RIGHT KNEE - 1-2 VIEW COMPARISON:  04/19/2020. FINDINGS: Plate and screw fixation distal femoral fracture. Slight medial displacement of the distal fracture fragment. Total right knee replacement. Peripheral vascular calcification. IMPRESSION: Plate and screw fixation distal right femoral fracture. Slight medial displacement of the distal fracture fragment. Electronically Signed   By: Marcello Moores  Register   On: 12/13/2020 12:25   DG C-Arm 1-60 Min  Result Date: 12/13/2020 CLINICAL DATA:  Surgery, elective. Additional history provided: ORIF distal right femur. Provided fluoroscopy time 1 minutes, 10 seconds (5.67 mGy). EXAM: RIGHT FEMUR 2 VIEWS; DG C-ARM 1-60 MIN COMPARISON:  Radiographs of the right femoral 12/11/2020. FINDINGS: Eight intraoperative fluoroscopic images of the right femur are submitted. The images demonstrate ORIF of an acute periprosthetic distal right femur fracture utilizing  a lateral plate and screws. There is some persistent displacement at the metadiaphyseal fracture site, but alignment is near anatomic. IMPRESSION: Eight intraoperative fluoroscopic images from ORIF of a periprosthetic distal right femur fracture, as described. Electronically Signed   By: Kellie Simmering DO   On: 12/13/2020 09:39   DG FEMUR, MIN 2 VIEWS RIGHT  Result Date: 12/13/2020 CLINICAL DATA:  Surgery, elective. Additional history provided: ORIF distal right femur. Provided fluoroscopy time 1 minutes, 10 seconds (5.67 mGy). EXAM: RIGHT FEMUR 2 VIEWS; DG C-ARM 1-60 MIN COMPARISON:  Radiographs of the right femoral 12/11/2020. FINDINGS: Eight intraoperative fluoroscopic images of the right femur are submitted. The images demonstrate ORIF of an acute periprosthetic distal right femur fracture utilizing a lateral plate and screws. There is some persistent displacement at the metadiaphyseal fracture site, but alignment is near anatomic. IMPRESSION: Eight intraoperative fluoroscopic images from ORIF of a periprosthetic distal right femur fracture, as described. Electronically Signed   By: Kellie Simmering DO   On: 12/13/2020 09:39   DG Femur Min 2 Views Right  Result Date: 12/11/2020 CLINICAL DATA:  Fall with pain EXAM: RIGHT FEMUR 2 VIEWS COMPARISON:  04/19/2020 FINDINGS: Right hip and proximal femur appear unremarkable. Acute comminuted transverse fracture the distal femoral diaphysis just proximal to the distal metaphysis. Lateral displacement of the main distal fracture fragment, by half of the with of the bone. Fractures do not extend to the prosthetic femoral component itself. IMPRESSION: Acute comminuted transverse fracture of the distal femoral diaphysis just proximal to the distal metaphysis. Electronically Signed   By: Nelson Chimes M.D.   On: 12/11/2020 00:26    (Echo, Carotid, EGD, Colonoscopy, ERCP)    Subjective: Patient seen and examined today.  Still has significant pain on the right knee and  lateral thigh but better than before.  She wants to make sure she continue similar doses of pain medicine at a skilled nursing rehab. Able to void without difficulty.   Discharge Exam: Vitals:   12/15/20 0501 12/15/20 0808  BP: 126/61 129/67  Pulse: 89 98  Resp: 18 15  Temp: 98.4 F (36.9 C) 98.5 F (36.9 C)  SpO2: 92% 93%   Vitals:   12/14/20 1938 12/14/20 2010 12/15/20 0501 12/15/20 0808  BP:  (!) 123/57 126/61 129/67  Pulse:  100 89 98  Resp:  _0 Temp:  97.6 F (36.4 C) 98.4 F (36.9 C) 98.5 F (36.9 C)  TempSrc:  Oral Oral Oral  SpO2: 94% 92% 92% 93%  Weight:      Height:        General: Pt is alert, awake, not in acute distress On room air.  Mild distress on talking about the pain. Cardiovascular: RRR, S1/S2 +, no rubs, no gallops Respiratory: CTA bilaterally, no wheezing, no rhonchi Abdominal: Soft, NT, ND, bowel sounds + Extremities:  Right knee on postop dressing, compression bandage.  Incisions look clean and dry.  Distal neurovascular status intact.    The results of significant diagnostics from this hospitalization (including imaging, microbiology, ancillary and laboratory) are listed below for reference.     Microbiology: Recent Results (from the past 240 hour(s))  SARS CORONAVIRUS 2 (TAT 6-24 HRS) Nasopharyngeal Nasopharyngeal Swab     Status: None   Collection Time: 12/11/20  2:08 AM   Specimen: Nasopharyngeal Swab  Result Value Ref Range Status   SARS Coronavirus 2 NEGATIVE NEGATIVE Final    Comment: (NOTE) SARS-CoV-2 target nucleic acids are NOT DETECTED.  The SARS-CoV-2 RNA is generally detectable in upper and lower respiratory specimens during the acute phase of infection. Negative results do not preclude SARS-CoV-2 infection, do not rule out co-infections with other pathogens, and should not be used as the sole basis for treatment or other patient management decisions. Negative results must be combined with clinical  observations, patient history, and epidemiological information. The expected result is Negative.  Fact Sheet for Patients: SugarRoll.be  Fact Sheet for Healthcare Providers: https://www.woods-mathews.com/  This test is not yet approved or cleared by the [place] FDA and  has been authorized for detection and/or diagnosis of SARS-CoV-2 by FDA under an Emergency Use Authorization (EUA). This EUA will remain  in effect (meaning this test can be used) for the duration of the COVID-19 declaration under Se ction 564(b)(1) of the Act, 21 U.S.C. section 360bbb-3(b)(1), unless the authorization is terminated or revoked sooner.  Performed at [facility], [place] [address], [place], [place] [address]   Surgical pcr screen     Status: None   Collection Time: 12/11/20  6:14 PM   Specimen: Nasal Mucosa; Nasal Swab  Result Value Ref Range Status   MRSA, PCR NEGATIVE NEGATIVE Final   Staphylococcus aureus NEGATIVE NEGATIVE Final    Comment: (NOTE) The Xpert SA Assay (FDA approved for NASAL specimens in patients 81 years of age and older), is one component of a comprehensive surveillance program. It is not intended to diagnose infection nor to guide or monitor treatment. Performed at [facility], [place] [address] [place], [place] [address]      Labs: BNP (last 3 results) No results for input(s): BNP in the last 8760 hours. Basic Metabolic Panel: Recent Labs  Lab 12/11/20 0020 12/13/20 1245 12/14/20 0203 12/15/20 0238  NA 138  --  137 138  K 3.7  --  4.2 4.2  CL 103  --  105 104  CO2 26  --  24 26  GLUCOSE 242*  --  219* 283*  BUN 16  --  18 21  CREATININE 0.78 0.60 0.60 0.67  CALCIUM 9.4  --  8.6* 9.0   Liver Function Tests: Recent Labs  Lab 12/11/20 1811  AST 36  ALT 33  ALKPHOS 125  BILITOT 0.7  PROT 7.9  ALBUMIN 3.6   No results for input(s): LIPASE, AMYLASE in the last 168 hours. No results for input(s):  AMMONIA in the last 168 hours. CBC: Recent Labs  Lab 12/11/20 0020 12/13/20 1245 12/14/20 0203 12/15/20 0238  WBC 6.6 7.9 7.1 5.4  NEUTROABS 5.3  --   --   --   HGB 12.9 10.9* 9.3* 8.8*  HCT 37.7 32.1* 26.9* 27.3*  MCV 86.5 86.1 85.1 87.8  PLT 154 142* 137* 129*   Cardiac Enzymes: No results for input(s): CKTOTAL, CKMB, CKMBINDEX, TROPONINI in the last 168 hours. BNP: Invalid input(s): POCBNP CBG: Recent Labs  Lab 12/14/20 1608 12/14/20 2011 12/14/20 2345 12/15/20 0458 12/15/20 0801  GLUCAP 285* 252* 285* 273* 242*   D-Dimer No results for input(s): DDIMER in the last 72 hours. Hgb A1c No results for input(s): HGBA1C in the last 72 hours. Lipid Profile No results for input(s): CHOL, HDL, LDLCALC, TRIG, CHOLHDL, LDLDIRECT in the last 72 hours. Thyroid function studies No results for input(s): TSH, T4TOTAL, T3FREE, THYROIDAB in the last 72 hours.  Invalid input(s): FREET3 Anemia work up No results for input(s): VITAMINB12, FOLATE, FERRITIN, TIBC, IRON, RETICCTPCT in the last 72 hours. Urinalysis    Component Value Date/Time   COLORURINE YELLOW 12/11/2020 [place] 12/11/2020 0851   LABSPEC 1.027 12/11/2020 0851   PHURINE 6.0 12/11/2020 0851   GLUCOSEU >=500 (A) 12/11/2020 0851   HGBUR NEGATIVE 12/11/2020 0851   BILIRUBINUR NEGATIVE 12/11/2020 0851   KETONESUR NEGATIVE 12/11/2020 0851   PROTEINUR NEGATIVE 12/11/2020 0851   UROBILINOGEN 0.2 12/06/2012 1934   NITRITE NEGATIVE 12/11/2020 0851   LEUKOCYTESUR NEGATIVE 12/11/2020 0851   Sepsis Labs Invalid input(s): PROCALCITONIN,  WBC,  LACTICIDVEN Microbiology Recent Results (from the past 240 hour(s))  SARS CORONAVIRUS 2 (TAT 6-24 HRS) Nasopharyngeal Nasopharyngeal Swab     Status: None   Collection Time: 12/11/20  2:08 AM   Specimen: Nasopharyngeal Swab  Result Value Ref Range Status   SARS Coronavirus 2 NEGATIVE NEGATIVE Final    Comment: (NOTE) SARS-CoV-2 target nucleic acids are NOT  DETECTED.  The SARS-CoV-2 RNA is generally detectable in upper and lower respiratory specimens during the acute phase of infection. Negative results do not preclude SARS-CoV-2 infection, do not rule out co-infections with other pathogens, and should not be used as the sole basis for treatment or other patient management decisions. Negative results must be combined with clinical observations, patient history, and epidemiological information. The expected result is Negative.  Fact Sheet for Patients: SugarRoll.be  Fact Sheet for Healthcare Providers: https://www.woods-mathews.com/  This test is not yet approved or cleared by the [place] FDA and  has been authorized for detection and/or diagnosis of SARS-CoV-2 by FDA under an Emergency Use Authorization (EUA). This EUA will remain  in effect (meaning this test can be used) for the duration of the COVID-19 declaration under Se ction 564(b)(1) of the Act, 21 U.S.C. section 360bbb-3(b)(1), unless the authorization is terminated or revoked sooner.  Performed at [facility], [place] [address], [place], [place] [address]   Surgical pcr screen     Status: None   Collection Time: 12/11/20  6:14 PM   Specimen: Nasal Mucosa; Nasal Swab  Result Value Ref Range Status   MRSA, PCR NEGATIVE NEGATIVE Final   Staphylococcus aureus NEGATIVE NEGATIVE Final    Comment: (NOTE) The Xpert SA Assay (FDA approved for NASAL specimens in patients 27 years of age and older), is one component of a comprehensive surveillance program. It is not intended to diagnose infection nor to guide or monitor treatment. Performed at [facility], [place] [address] [place], [place] [address]  Time coordinating discharge:  40 minutes  SIGNED:   Barb Merino, MD  [facility] 12/15/2020, 11:03 AM

## 2020-12-15 NOTE — TOC Progression Note (Signed)
Transition of Care [facility]) - Progression Note    Patient Details  Name: MARKIAH JANEWAY MRN: [id number] Date of Birth: August 02, 1946  Transition of Care [facility]) CM/SW [place], [place] Phone Number: 12/15/2020, 1:34 PM  Clinical Narrative:     CSW spoke with pt at bedside to give bed offers. Pt called dtrs on the phone and they noted they would like a facility in [place], but did not want [facility] or [facility]. Dtr requested that the list be emailed to her, this was done. CSW is currently waiting to hear for final decision. Pt also had concerns about the care she is receiving at the hospital, CSW noted she would speak to charge nurse. Charge was notified. SW will continue to follow for DC planning.  Expected Discharge Plan: [place] Barriers to Discharge: Continued Medical Work up,No SNF bed  Expected Discharge Plan and Services Expected Discharge Plan: [place]   Discharge Planning Services: CM Consult Post Acute Care Choice: [place] Living arrangements for the past 2 months: Single Family Home Expected Discharge Date: 12/15/20                                     Social Determinants of Health (SDOH) Interventions    Readmission Risk Interventions No flowsheet data found.

## 2020-12-15 NOTE — Progress Notes (Signed)
Inpatient Diabetes Program Recommendations  AACE/ADA: New Consensus Statement on Inpatient Glycemic Control (2015)  Target Ranges:  Prepandial:   less than 140 mg/dL      Peak postprandial:   less than 180 mg/dL (1-2 hours)      Critically ill patients:  140 - 180 mg/dL   Lab Results  Component Value Date   GLUCAP 242 (H) 12/15/2020   HGBA1C 8.6 (H) 12/11/2020    Review of Glycemic Control Results for Paula Steele, Paula Steele (MRN [id number]) as of 12/15/2020 09:51  Ref. Range 12/14/2020 07:59 12/14/2020 11:33 12/14/2020 16:08 12/14/2020 20:11 12/14/2020 23:45 12/15/2020 04:58 12/15/2020 08:01  Glucose-Capillary Latest Ref Range: 70 - 99 mg/dL 167 (H) 232 (H) 285 (H) 252 (H) 285 (H) 273 (H) 242 (H)   Diabetes history: DM 2 Outpatient Diabetes medications: Ozempic 1 mg QSunday, Tresiba 42 units Daily Current orders for Inpatient glycemic control:  Lantus 21 units Daily Novolog 0-9 units Q4 hours  A1c 8.6% on 2/21  Inpatient Diabetes Program Recommendations:    -  Increase Lantus to 26 units  Thanks, Tama Headings RN, MSN, BC-ADM Inpatient Diabetes Coordinator Team Pager [phone] (8a-5p)

## 2020-12-15 NOTE — Progress Notes (Signed)
Nutrition Follow-up  DOCUMENTATION CODES:   Not applicable  INTERVENTION:   -Continue Glucerna Shake po TID, each supplement provides 220 kcal and 10 grams of protein -Continue MVI with minerals daily  NUTRITION DIAGNOSIS:   Increased nutrient needs related to post-op healing as evidenced by estimated needs.  Ongoing  GOAL:   Patient will meet greater than or equal to 90% of their needs  Progressing   MONITOR:   PO intake,Supplement acceptance,Labs,Weight trends,Skin,I & O's  REASON FOR ASSESSMENT:   Consult Assessment of nutrition requirement/status,Hip fracture protocol  ASSESSMENT:   Paula Steele is a 75 y.o. female with medical history significant of DM2, HTN, NASH.  2/23- s/p Open reduction internal fixation of right distal femur fracture  Spoke with pt at bedside, who reports feeling better today. She complains of stiffness in her rt leg due to not moving it secondary to sleep. Pt reports she has a fair appetite; waiting on breakfast to be delivered. Noted meal completions 75%.   PTA, pt shares she typically consumes 2 meals per day (Breakfast: 2-3 over easy eggs and toast; Dinner: vegetable plate). Pt shares she does not prefer meat and does not eat it often, but gets protein sources though eggs and beans.   Pt endorses progressive wt loss over the past 2 years secondary to her husband passing away. She is unsure of her UBW of how much weight she hast lost, however, reports concern over muscle atrophy in her arms. RD discussed importance of good meal and supplement intake to support healing and preserve lean body mass. Pt likes Glucerna supplements and is amenable to continue them after RD explained rationale for ordering.   Medications reviewed and include colace.   Per TOC notes, plan to d/c to SNF once medically stable.   Labs reviewed: CBGS: 242-285 (inpatient orders for glycemic control are 0-9 units insulin aspart every 4 hours and 21 units insulin  glargine daily).   NUTRITION - FOCUSED PHYSICAL EXAM:  Flowsheet Row Most Recent Value  Orbital Region No depletion  Upper Arm Region No depletion  Thoracic and Lumbar Region No depletion  Buccal Region No depletion  Temple Region No depletion  Clavicle Bone Region No depletion  Clavicle and Acromion Bone Region No depletion  Scapular Bone Region No depletion  Dorsal Hand No depletion  Patellar Region No depletion  Anterior Thigh Region No depletion  Posterior Calf Region No depletion  Edema (RD Assessment) None  Hair Reviewed  Eyes Reviewed  Mouth Reviewed  Skin Reviewed  Nails Reviewed       Diet Order:   Diet Order            Diet Carb Modified Fluid consistency: Thin; Room service appropriate? Yes  Diet effective now                 EDUCATION NEEDS:   No education needs have been identified at this time  Skin:  Skin Assessment: Reviewed RN Assessment  Last BM:  12/12/20  Height:   Ht Readings from Last 1 Encounters:  12/14/20 5\' 8"  (1.727 m)    Weight:   Wt Readings from Last 1 Encounters:  12/14/20 92.1 kg    Ideal Body Weight:  63.6 kg  BMI:  Body mass index is 30.87 kg/m.  Estimated Nutritional Needs:   Kcal:  1900-2100  Protein:  95-110 grams  Fluid:  > 1.9 L    Loistine Chance, RD, LDN, [place] Registered Dietitian II Certified Diabetes Care and Education  Specialist Please refer to [facility] for RD and/or RD on-call/weekend/after hours pager

## 2020-12-15 NOTE — Progress Notes (Signed)
Orthopaedic Trauma Progress Note  SUBJECTIVE: Doing okay. No major issues  OBJECTIVE:  Vitals:   12/15/20 0808 12/15/20 1133  BP: 129/67 98/61  Pulse: 98   Resp: 15 18  Temp: 98.5 F (36.9 C)   SpO2: 93%     General: Sitting up in bed, NAD Respiratory: No increased work of breathing.  Right Lower Extremity: incisions CDI. Tender with palpation over knee and distal thigh as expected. Tolerates minimal knee motion secondary to pain. Ankle dorsiflexion/plantarflexion intact. Motor and sensory function intact. Compartments soft and compressible.  +DP pulse  IMAGING: Stable post op imaging.   LABS:  Results for orders placed or performed during the hospital encounter of 12/10/20 (from the past 24 hour(s))  Glucose, capillary     Status: Abnormal   Collection Time: 12/14/20  4:08 PM  Result Value Ref Range   Glucose-Capillary 285 (H) 70 - 99 mg/dL  Glucose, capillary     Status: Abnormal   Collection Time: 12/14/20  8:11 PM  Result Value Ref Range   Glucose-Capillary 252 (H) 70 - 99 mg/dL  Glucose, capillary     Status: Abnormal   Collection Time: 12/14/20 11:45 PM  Result Value Ref Range   Glucose-Capillary 285 (H) 70 - 99 mg/dL  Basic metabolic panel     Status: Abnormal   Collection Time: 12/15/20  2:38 AM  Result Value Ref Range   Sodium 138 135 - 145 mmol/L   Potassium 4.2 3.5 - 5.1 mmol/L   Chloride 104 98 - 111 mmol/L   CO2 26 22 - 32 mmol/L   Glucose, Bld 283 (H) 70 - 99 mg/dL   BUN 21 8 - 23 mg/dL   Creatinine, Ser 0.67 0.44 - 1.00 mg/dL   Calcium 9.0 8.9 - 10.3 mg/dL   GFR, Estimated >60 >60 mL/min   Anion gap 8 5 - 15  CBC     Status: Abnormal   Collection Time: 12/15/20  2:38 AM  Result Value Ref Range   WBC 5.4 4.0 - 10.5 K/uL   RBC 3.11 (L) 3.87 - 5.11 MIL/uL   Hemoglobin 8.8 (L) 12.0 - 15.0 g/dL   HCT 27.3 (L) 36.0 - 46.0 %   MCV 87.8 80.0 - 100.0 fL   MCH 28.3 26.0 - 34.0 pg   MCHC 32.2 30.0 - 36.0 g/dL   RDW 14.4 11.5 - 15.5 %   Platelets 129 (L)  150 - 400 K/uL   nRBC 0.0 0.0 - 0.2 %  Glucose, capillary     Status: Abnormal   Collection Time: 12/15/20  4:58 AM  Result Value Ref Range   Glucose-Capillary 273 (H) 70 - 99 mg/dL  Glucose, capillary     Status: Abnormal   Collection Time: 12/15/20  8:01 AM  Result Value Ref Range   Glucose-Capillary 242 (H) 70 - 99 mg/dL    ASSESSMENT: Paula Steele is a 75 y.o. female, 2 Days Post-Op s/p ORIF RIGHT DISTAL FEMUR FRACTURE  CV/Blood loss: Acute blood loss anemia, Hgb 8.8 this AM. Hemodynamically stable  PLAN: Weightbearing: PWB 50% RLE Incisional and dressing care: Dressing PRN Showering: Okay to shower now Orthopedic device(s): None  Pain management:  1. Tylenol 325-650 mg q 6 hours PRN 2. Zanaflex 4 mg q 6 hours PRN 3. Percocet 10-325 mg q 6 hours PRN 4. Lyrica 50 mg BID 5. Dilaudid 0.5-1 mg q 4 hours PRN 6. Toradol 15 mg q 6 hours x 5 doses  VTE prophylaxis: Lovenox, SCDs ID:  Ancef 2gm post op Foley/Lines: No foley, KVO IVFs Impediments to Fracture Healing: Vit D level looks great at 48, no supplementation needed Dispo: PT/OT eval , dispo pending.  Follow - up plan: 2 weeks after discharge for repeat x-rays and wound check  Contact information:  Katha Hamming MD, Patrecia Pace PA-C. After hours and holidays please check [email or web address] for group call information for [facility]

## 2020-12-16 DIAGNOSIS — E119 Type 2 diabetes mellitus without complications: Secondary | ICD-10-CM | POA: Diagnosis not present

## 2020-12-16 DIAGNOSIS — I10 Essential (primary) hypertension: Secondary | ICD-10-CM | POA: Diagnosis not present

## 2020-12-16 DIAGNOSIS — S72401A Unspecified fracture of lower end of right femur, initial encounter for closed fracture: Secondary | ICD-10-CM | POA: Diagnosis not present

## 2020-12-16 DIAGNOSIS — R338 Other retention of urine: Secondary | ICD-10-CM | POA: Diagnosis not present

## 2020-12-16 LAB — GLUCOSE, CAPILLARY
Glucose-Capillary: 195 mg/dL — ABNORMAL HIGH (ref 70–99)
Glucose-Capillary: 221 mg/dL — ABNORMAL HIGH (ref 70–99)
Glucose-Capillary: 227 mg/dL — ABNORMAL HIGH (ref 70–99)
Glucose-Capillary: 252 mg/dL — ABNORMAL HIGH (ref 70–99)
Glucose-Capillary: 259 mg/dL — ABNORMAL HIGH (ref 70–99)
Glucose-Capillary: 290 mg/dL — ABNORMAL HIGH (ref 70–99)

## 2020-12-16 LAB — BASIC METABOLIC PANEL
Anion gap: 8 (ref 5–15)
BUN: 20 mg/dL (ref 8–23)
CO2: 23 mmol/L (ref 22–32)
Calcium: 9 mg/dL (ref 8.9–10.3)
Chloride: 105 mmol/L (ref 98–111)
Creatinine, Ser: 0.62 mg/dL (ref 0.44–1.00)
GFR, Estimated: >60 mL/min (ref >60)
Glucose, Bld: 269 mg/dL — ABNORMAL HIGH (ref 70–99)
Potassium: 4.1 mmol/L (ref 3.5–5.1)
Sodium: 136 mmol/L (ref 135–145)

## 2020-12-16 LAB — CBC
HCT: 26.9 % — ABNORMAL LOW (ref 36.0–46.0)
Hemoglobin: 9.2 g/dL — ABNORMAL LOW (ref 12.0–15.0)
MCH: 29.3 pg (ref 26.0–34.0)
MCHC: 34.2 g/dL (ref 30.0–36.0)
MCV: 85.7 fL (ref 80.0–100.0)
Platelets: 149 10*3/uL — ABNORMAL LOW (ref 150–400)
RBC: 3.14 MIL/uL — ABNORMAL LOW (ref 3.87–5.11)
RDW: 14.6 % (ref 11.5–15.5)
WBC: 5.8 10*3/uL (ref 4.0–10.5)
nRBC: 0 % (ref 0.0–0.2)

## 2020-12-16 NOTE — Progress Notes (Signed)
PROGRESS NOTE    JASMAN PFEIFLE  QQV:[id number] DOB: August 20, 1946 DOA: 12/10/2020 PCP: Adaline Sill, NP    Brief Narrative:  Patient with history of type 2 diabetes on insulin, hypertension, Karlene Lineman cirrhosis, history of right knee replacement presented to the hospital with mechanical fall, tripped down a curb.  Severe right pain.  Found to have periprosthetic right distal femoral fracture.  Admitted for operative intervention. 2/23, underwent ORIF.   Assessment & Plan:   Principal Problem:   Closed fracture of right distal femur ([place]) Active Problems:   DM2 (diabetes mellitus, type 2) (HCC)   NASH (nonalcoholic steatohepatitis)   Obesity   Hypertension   Acute urinary retention  Closed periprosthetic right distal femoral fracture: With total knee in place S/p ORIF 2/23 Adequate pain medications along with laxatives, uses percocet at home . Changed to q6h PRN. Dilaudid PRN for severe pain. lovenox for DVT prophylaxis. 50% weight bearing right leg Work with PT OT.  Refer to a skilled living facility for inpatient therapies. Patient is on chronic pain medication at home with Percocet 10/325 3 times a day. Currently on Percocet 10 mg every 6 hours, Dilaudid, Toradol.  She is also on Lyrica at home.  Has chronic pain management with her primary care provider.  Type 2 diabetes: Adequate controlled on current dose of insulin.  Continue.  NASH cirrhosis: Stable.  Liver test normal.  Hypertension: Blood pressure stable on current regimen.  DVT prophylaxis: enoxaparin (LOVENOX) injection 40 mg Start: 12/14/20 0800 SCDs Start: 12/13/20 1140 SCDs Start: 12/11/20 0440   Code Status: Full code Family Communication: daughter on the speaker phone. Disposition Plan: Status is: Inpatient  Remains inpatient appropriate because:Inpatient level of care appropriate due to severity of illness   Dispo: The patient is from: Home              Anticipated d/c is to: SNF               Anticipated d/c date is: When bed available.              Patient currently is medically stable.   Difficult to place patient No         Consultants:   Orthopedics  Procedures:   ORIF right distal femur.  Antimicrobials:   None   Subjective: Patient seen and examined.  No overnight events.  She is having a lot of pain on her right knee.  Patient is very much fixated on 10 mg dose of Percocet. Patient is asking for IV pain medication.   Objective: Vitals:   12/15/20 2039 12/16/20 0402 12/16/20 0758 12/16/20 1229  BP:  127/60 (!) 143/65 (!) 127/59  Pulse: 92 89 99 99  Resp: 18 17 15 18   Temp:  98 F (36.7 C) 98.5 F (36.9 C) 98 F (36.7 C)  TempSrc:   Oral   SpO2: 95% 90% 92% 95%  Weight:      Height:       No intake or output data in the 24 hours ending 12/16/20 1335 Filed Weights   12/14/20 1624  Weight: 92.1 kg    Examination:  General exam: Appears calm and comfortable , anxious and in moderate distress with pain on mobility. Respiratory system: Clear to auscultation. Respiratory effort normal. Cardiovascular system: S1 & S2 heard, RRR. No JVD, murmurs, rubs, gallops or clicks. No pedal edema. Gastrointestinal system: Abdomen is nondistended, soft and nontender. No organomegaly or masses felt. Normal bowel sounds heard. Right Extremity:  Right knee with immediate post op dressing and compression bandage. neurovascular status intact. Patient has swelling and effusion on the right knee.  Normal temperature.  No erythema or cellulitis.    Data Reviewed: I have personally reviewed following labs and imaging studies  CBC: Recent Labs  Lab 12/11/20 0020 12/13/20 1245 12/14/20 0203 12/15/20 0238 12/16/20 0142  WBC 6.6 7.9 7.1 5.4 5.8  NEUTROABS 5.3  --   --   --   --   HGB 12.9 10.9* 9.3* 8.8* 9.2*  HCT 37.7 32.1* 26.9* 27.3* 26.9*  MCV 86.5 86.1 85.1 87.8 85.7  PLT 154 142* 137* 129* 127*   Basic Metabolic Panel: Recent Labs  Lab  12/11/20 0020 12/13/20 1245 12/14/20 0203 12/15/20 0238 12/16/20 0142  NA 138  --  137 138 136  K 3.7  --  4.2 4.2 4.1  CL 103  --  105 104 105  CO2 26  --  24 26 23   GLUCOSE 242*  --  219* 283* 269*  BUN 16  --  18 21 20   CREATININE 0.78 0.60 0.60 0.67 0.62  CALCIUM 9.4  --  8.6* 9.0 9.0   GFR: Estimated Creatinine Clearance: 71 mL/min (by C-G formula based on SCr of 0.62 mg/dL). Liver Function Tests: Recent Labs  Lab 12/11/20 1811  AST 36  ALT 33  ALKPHOS 125  BILITOT 0.7  PROT 7.9  ALBUMIN 3.6   No results for input(s): LIPASE, AMYLASE in the last 168 hours. No results for input(s): AMMONIA in the last 168 hours. Coagulation Profile: No results for input(s): INR, PROTIME in the last 168 hours. Cardiac Enzymes: No results for input(s): CKTOTAL, CKMB, CKMBINDEX, TROPONINI in the last 168 hours. BNP (last 3 results) No results for input(s): PROBNP in the last 8760 hours. HbA1C: No results for input(s): HGBA1C in the last 72 hours. CBG: Recent Labs  Lab 12/15/20 1953 12/16/20 0007 12/16/20 0401 12/16/20 0818 12/16/20 1226  GLUCAP 235* 259* 227* 252* 290*   Lipid Profile: No results for input(s): CHOL, HDL, LDLCALC, TRIG, CHOLHDL, LDLDIRECT in the last 72 hours. Thyroid Function Tests: No results for input(s): TSH, T4TOTAL, FREET4, T3FREE, THYROIDAB in the last 72 hours. Anemia Panel: No results for input(s): VITAMINB12, FOLATE, FERRITIN, TIBC, IRON, RETICCTPCT in the last 72 hours. Sepsis Labs: No results for input(s): PROCALCITON, LATICACIDVEN in the last 168 hours.  Recent Results (from the past 240 hour(s))  SARS CORONAVIRUS 2 (TAT 6-24 HRS) Nasopharyngeal Nasopharyngeal Swab     Status: None   Collection Time: 12/11/20  2:08 AM   Specimen: Nasopharyngeal Swab  Result Value Ref Range Status   SARS Coronavirus 2 NEGATIVE NEGATIVE Final    Comment: (NOTE) SARS-CoV-2 target nucleic acids are NOT DETECTED.  The SARS-CoV-2 RNA is generally detectable in  upper and lower respiratory specimens during the acute phase of infection. Negative results do not preclude SARS-CoV-2 infection, do not rule out co-infections with other pathogens, and should not be used as the sole basis for treatment or other patient management decisions. Negative results must be combined with clinical observations, patient history, and epidemiological information. The expected result is Negative.  Fact Sheet for Patients: SugarRoll.be  Fact Sheet for Healthcare Providers: https://www.woods-mathews.com/  This test is not yet approved or cleared by the [place] FDA and  has been authorized for detection and/or diagnosis of SARS-CoV-2 by FDA under an Emergency Use Authorization (EUA). This EUA will remain  in effect (meaning this test can be used) for  the duration of the COVID-19 declaration under Se ction 564(b)(1) of the Act, 21 U.S.C. section 360bbb-3(b)(1), unless the authorization is terminated or revoked sooner.  Performed at [facility], [place] [address], [place], [place] [address]   Surgical pcr screen     Status: None   Collection Time: 12/11/20  6:14 PM   Specimen: Nasal Mucosa; Nasal Swab  Result Value Ref Range Status   MRSA, PCR NEGATIVE NEGATIVE Final   Staphylococcus aureus NEGATIVE NEGATIVE Final    Comment: (NOTE) The Xpert SA Assay (FDA approved for NASAL specimens in patients 75 years of age and older), is one component of a comprehensive surveillance program. It is not intended to diagnose infection nor to guide or monitor treatment. Performed at [facility], [place] [address], [place], [place] [address]          Radiology Studies: No results found.      Scheduled Meds: . Chlorhexidine Gluconate Cloth  6 each Topical Daily  . docusate sodium  100 mg Oral BID  . enoxaparin (LOVENOX) injection  40 mg Subcutaneous Q24H  . feeding supplement (GLUCERNA SHAKE)  237 mL Oral  TID BM  . fluticasone  1 spray Each Nare Daily  . insulin aspart  0-9 Units Subcutaneous Q4H  . insulin glargine  26 Units Subcutaneous Q1400  . mometasone-formoterol  2 puff Inhalation BID  . montelukast  10 mg Oral QHS  . multivitamin with minerals  1 tablet Oral Daily  . pantoprazole  40 mg Oral Daily  . pregabalin  50 mg Oral BID  . vortioxetine HBr  10 mg Oral Daily   Continuous Infusions: . lactated ringers 10 mL/hr at 12/13/20 0806     LOS: 5 days    Time spent: 30 minutes    Barb Merino, MD [facility] Pager [phone]

## 2020-12-16 NOTE — Progress Notes (Signed)
Patient is requesting the Dilaudid iv over the Oxy/Percocets.  She stated that the pills are not helping her pain.  Her pain has been 7-10 on this shift.

## 2020-12-16 NOTE — TOC Progression Note (Signed)
Transition of Care [facility]) - Progression Note    Patient Details  Name: Paula Steele MRN: [id number] Date of Birth: September 19, 1946  [facility] CM/SW [place], [place] Phone Number: 12/16/2020, 1:22 PM  Clinical Narrative:    CSW spoke with Suanne Marker and pt at bedside. Bed offers were discussed and questions about the process were answered. Pt and dtr have chosen [facility] facility, a message was left for them, as they do not do admissions on the weekend. CSW was updated that the plan was for her to stay until Monday as her pain levels are still high. CSW will plan for Monday DC and will ask for Covid tomorrow. SW will continue to follow for DC planning.   Expected Discharge Plan: [place] Barriers to Discharge: Continued Medical Work up,No SNF bed  Expected Discharge Plan and Services Expected Discharge Plan: [place]   Discharge Planning Services: CM Consult Post Acute Care Choice: [place] Living arrangements for the past 2 months: Single Family Home Expected Discharge Date: 12/15/20                                     Social Determinants of Health (SDOH) Interventions    Readmission Risk Interventions No flowsheet data found.

## 2020-12-17 DIAGNOSIS — S72401A Unspecified fracture of lower end of right femur, initial encounter for closed fracture: Secondary | ICD-10-CM | POA: Diagnosis not present

## 2020-12-17 DIAGNOSIS — I10 Essential (primary) hypertension: Secondary | ICD-10-CM | POA: Diagnosis not present

## 2020-12-17 DIAGNOSIS — R338 Other retention of urine: Secondary | ICD-10-CM | POA: Diagnosis not present

## 2020-12-17 DIAGNOSIS — E119 Type 2 diabetes mellitus without complications: Secondary | ICD-10-CM | POA: Diagnosis not present

## 2020-12-17 LAB — CBC
HCT: 27.5 % — ABNORMAL LOW (ref 36.0–46.0)
Hemoglobin: 9 g/dL — ABNORMAL LOW (ref 12.0–15.0)
MCH: 28.5 pg (ref 26.0–34.0)
MCHC: 32.7 g/dL (ref 30.0–36.0)
MCV: 87 fL (ref 80.0–100.0)
Platelets: 161 10*3/uL (ref 150–400)
RBC: 3.16 MIL/uL — ABNORMAL LOW (ref 3.87–5.11)
RDW: 14.4 % (ref 11.5–15.5)
WBC: 5.7 10*3/uL (ref 4.0–10.5)
nRBC: 0 % (ref 0.0–0.2)

## 2020-12-17 LAB — GLUCOSE, CAPILLARY
Glucose-Capillary: 213 mg/dL — ABNORMAL HIGH (ref 70–99)
Glucose-Capillary: 215 mg/dL — ABNORMAL HIGH (ref 70–99)
Glucose-Capillary: 240 mg/dL — ABNORMAL HIGH (ref 70–99)
Glucose-Capillary: 274 mg/dL — ABNORMAL HIGH (ref 70–99)
Glucose-Capillary: 304 mg/dL — ABNORMAL HIGH (ref 70–99)
Glucose-Capillary: 319 mg/dL — ABNORMAL HIGH (ref 70–99)

## 2020-12-17 LAB — SARS CORONAVIRUS 2 (TAT 6-24 HRS): SARS Coronavirus 2: NEGATIVE

## 2020-12-17 NOTE — Plan of Care (Signed)

## 2020-12-17 NOTE — Progress Notes (Signed)
PROGRESS NOTE    Paula Steele  XHB:[id number] DOB: 09-23-1946 DOA: 12/10/2020 PCP: Adaline Sill, NP    Brief Narrative:  Patient with history of type 2 diabetes on insulin, hypertension, Karlene Lineman cirrhosis, history of right knee replacement presented to the hospital with mechanical fall, tripped down a curb.  Severe right pain.  Found to have periprosthetic right distal femoral fracture.  Admitted for operative intervention. 2/23, underwent ORIF.   Assessment & Plan:   Principal Problem:   Closed fracture of right distal femur ([place]) Active Problems:   DM2 (diabetes mellitus, type 2) (HCC)   NASH (nonalcoholic steatohepatitis)   Obesity   Hypertension   Acute urinary retention  Closed periprosthetic right distal femoral fracture: With total knee in place S/p ORIF 2/23 Adequate pain medications along with laxatives, uses percocet at home . Changed to q6h PRN. Dilaudid PRN for severe pain. lovenox for DVT prophylaxis. 50% weight bearing right leg Work with PT OT.  Refer to a skilled living facility for inpatient therapies. Patient is on chronic pain medication at home with Percocet 10/325 3 times a day. Currently on Percocet 10 mg every 6 hours, Dilaudid, Toradol.  She is also on Lyrica at home.  Has chronic pain management with her primary care provider.  Type 2 diabetes: Adequate controlled on current dose of insulin.  Continue.  NASH cirrhosis: Stable.  Liver test normal.  Hypertension: Blood pressure stable on current regimen.  DVT prophylaxis: enoxaparin (LOVENOX) injection 40 mg Start: 12/14/20 0800 SCDs Start: 12/13/20 1140 SCDs Start: 12/11/20 0440   Code Status: Full code Family Communication: daughter on the speaker phone 2/26. Disposition Plan: Status is: Inpatient  Remains inpatient appropriate because:Inpatient level of care appropriate due to severity of illness   Dispo: The patient is from: Home              Anticipated d/c is to: SNF               Anticipated d/c date is: When bed available.              Patient currently is medically stable.   Difficult to place patient No   Patient is medically stable.  She can be discharged when skilled nursing facility can admit her.      Consultants:   Orthopedics  Procedures:   ORIF right distal femur.  Antimicrobials:   None   Subjective: Patient seen and examined.  Continues to have problems with her care providers at bedside. Pain control is suboptimal as per patient.   Objective: Vitals:   12/16/20 1229 12/16/20 2017 12/16/20 2100 12/17/20 0411  BP: (!) 127/59 119/61  (!) 90/51  Pulse: 99 88 96 82  Resp: 18 18 18 17   Temp: 98 F (36.7 C) 98.3 F (36.8 C)  97.9 F (36.6 C)  TempSrc:      SpO2: 95% 97% 97% 93%  Weight:      Height:       No intake or output data in the 24 hours ending 12/17/20 1252 Filed Weights   12/14/20 1624  Weight: 92.1 kg    Examination: General: Patient is crying and shouting for personal care in the commode.  Looks in moderate distress and anxious. Cardiovascular: S1-S2 normal Respiratory: Bilateral clear Gastrointestinal: Soft nontender Ext: Right lateral thigh incisions clean and dry.  She has swelling of the knee, nontender.  No redness or erythema.  .    Data Reviewed: I have personally reviewed following labs  and imaging studies  CBC: Recent Labs  Lab 12/11/20 0020 12/13/20 1245 12/14/20 0203 12/15/20 0238 12/16/20 0142 12/17/20 0049  WBC 6.6 7.9 7.1 5.4 5.8 5.7  NEUTROABS 5.3  --   --   --   --   --   HGB 12.9 10.9* 9.3* 8.8* 9.2* 9.0*  HCT 37.7 32.1* 26.9* 27.3* 26.9* 27.5*  MCV 86.5 86.1 85.1 87.8 85.7 87.0  PLT 154 142* 137* 129* 149* 893   Basic Metabolic Panel: Recent Labs  Lab 12/11/20 0020 12/13/20 1245 12/14/20 0203 12/15/20 0238 12/16/20 0142  NA 138  --  137 138 136  K 3.7  --  4.2 4.2 4.1  CL 103  --  105 104 105  CO2 26  --  24 26 23   GLUCOSE 242*  --  219* 283* 269*  BUN 16  --  18 21  20   CREATININE 0.78 0.60 0.60 0.67 0.62  CALCIUM 9.4  --  8.6* 9.0 9.0   GFR: Estimated Creatinine Clearance: 71 mL/min (by C-G formula based on SCr of 0.62 mg/dL). Liver Function Tests: Recent Labs  Lab 12/11/20 1811  AST 36  ALT 33  ALKPHOS 125  BILITOT 0.7  PROT 7.9  ALBUMIN 3.6   No results for input(s): LIPASE, AMYLASE in the last 168 hours. No results for input(s): AMMONIA in the last 168 hours. Coagulation Profile: No results for input(s): INR, PROTIME in the last 168 hours. Cardiac Enzymes: No results for input(s): CKTOTAL, CKMB, CKMBINDEX, TROPONINI in the last 168 hours. BNP (last 3 results) No results for input(s): PROBNP in the last 8760 hours. HbA1C: No results for input(s): HGBA1C in the last 72 hours. CBG: Recent Labs  Lab 12/16/20 2015 12/17/20 0016 12/17/20 0410 12/17/20 0824 12/17/20 1215  GLUCAP 195* 240* 215* 304* 213*   Lipid Profile: No results for input(s): CHOL, HDL, LDLCALC, TRIG, CHOLHDL, LDLDIRECT in the last 72 hours. Thyroid Function Tests: No results for input(s): TSH, T4TOTAL, FREET4, T3FREE, THYROIDAB in the last 72 hours. Anemia Panel: No results for input(s): VITAMINB12, FOLATE, FERRITIN, TIBC, IRON, RETICCTPCT in the last 72 hours. Sepsis Labs: No results for input(s): PROCALCITON, LATICACIDVEN in the last 168 hours.  Recent Results (from the past 240 hour(s))  SARS CORONAVIRUS 2 (TAT 6-24 HRS) Nasopharyngeal Nasopharyngeal Swab     Status: None   Collection Time: 12/11/20  2:08 AM   Specimen: Nasopharyngeal Swab  Result Value Ref Range Status   SARS Coronavirus 2 NEGATIVE NEGATIVE Final    Comment: (NOTE) SARS-CoV-2 target nucleic acids are NOT DETECTED.  The SARS-CoV-2 RNA is generally detectable in upper and lower respiratory specimens during the acute phase of infection. Negative results do not preclude SARS-CoV-2 infection, do not rule out co-infections with other pathogens, and should not be used as the sole basis for  treatment or other patient management decisions. Negative results must be combined with clinical observations, patient history, and epidemiological information. The expected result is Negative.  Fact Sheet for Patients: SugarRoll.be  Fact Sheet for Healthcare Providers: https://www.woods-mathews.com/  This test is not yet approved or cleared by the [place] FDA and  has been authorized for detection and/or diagnosis of SARS-CoV-2 by FDA under an Emergency Use Authorization (EUA). This EUA will remain  in effect (meaning this test can be used) for the duration of the COVID-19 declaration under Se ction 564(b)(1) of the Act, 21 U.S.C. section 360bbb-3(b)(1), unless the authorization is terminated or revoked sooner.  Performed at [facility]  Lab, [address] [place], [place] [address]   Surgical pcr screen     Status: None   Collection Time: 12/11/20  6:14 PM   Specimen: Nasal Mucosa; Nasal Swab  Result Value Ref Range Status   MRSA, PCR NEGATIVE NEGATIVE Final   Staphylococcus aureus NEGATIVE NEGATIVE Final    Comment: (NOTE) The Xpert SA Assay (FDA approved for NASAL specimens in patients 76 years of age and older), is one component of a comprehensive surveillance program. It is not intended to diagnose infection nor to guide or monitor treatment. Performed at [facility], [place] [address] [place], [place] [address]          Radiology Studies: No results found.      Scheduled Meds: . Chlorhexidine Gluconate Cloth  6 each Topical Daily  . docusate sodium  100 mg Oral BID  . enoxaparin (LOVENOX) injection  40 mg Subcutaneous Q24H  . feeding supplement (GLUCERNA SHAKE)  237 mL Oral TID BM  . fluticasone  1 spray Each Nare Daily  . insulin aspart  0-9 Units Subcutaneous Q4H  . insulin glargine  26 Units Subcutaneous Q1400  . mometasone-formoterol  2 puff Inhalation BID  . montelukast  10 mg Oral QHS  .  multivitamin with minerals  1 tablet Oral Daily  . pantoprazole  40 mg Oral Daily  . pregabalin  50 mg Oral BID  . vortioxetine HBr  10 mg Oral Daily   Continuous Infusions: . lactated ringers 10 mL/hr at 12/13/20 0806     LOS: 6 days    Time spent: 25 minutes   Barb Merino, MD [facility] Pager [phone]

## 2020-12-17 NOTE — TOC Progression Note (Signed)
Transition of Care [facility]) - Progression Note    Patient Details  Name: Paula Steele MRN: [id number] Date of Birth: October 28, 1945  Transition of Care [facility]) CM/SW Contact  [address], [place], [place] Phone Number: 12/17/2020, 12:02 PM  Clinical Narrative:    Patient's daughter Suanne Marker updated by phone in regards to discharge plan for Monday. [employer] Covid test requested.  Transition of Care to continue to follow for discharge needs.   [address] LCSW Transition of Care [phone]     Expected Discharge Plan: [place] Barriers to Discharge: Continued Medical Work up,No SNF bed  Expected Discharge Plan and Services Expected Discharge Plan: [place]   Discharge Planning Services: CM Consult Post Acute Care Choice: [place] Living arrangements for the past 2 months: Single Family Home Expected Discharge Date: 12/15/20                                     Social Determinants of Health (SDOH) Interventions    Readmission Risk Interventions No flowsheet data found.

## 2020-12-17 NOTE — Plan of Care (Signed)

## 2020-12-18 DIAGNOSIS — S72401A Unspecified fracture of lower end of right femur, initial encounter for closed fracture: Secondary | ICD-10-CM | POA: Diagnosis not present

## 2020-12-18 DIAGNOSIS — Z6836 Body mass index (BMI) 36.0-36.9, adult: Secondary | ICD-10-CM

## 2020-12-18 DIAGNOSIS — E1165 Type 2 diabetes mellitus with hyperglycemia: Secondary | ICD-10-CM | POA: Diagnosis not present

## 2020-12-18 DIAGNOSIS — K7581 Nonalcoholic steatohepatitis (NASH): Secondary | ICD-10-CM | POA: Diagnosis not present

## 2020-12-18 LAB — CBC
HCT: 27.4 % — ABNORMAL LOW (ref 36.0–46.0)
Hemoglobin: 9.2 g/dL — ABNORMAL LOW (ref 12.0–15.0)
MCH: 29.1 pg (ref 26.0–34.0)
MCHC: 33.6 g/dL (ref 30.0–36.0)
MCV: 86.7 fL (ref 80.0–100.0)
Platelets: 184 10*3/uL (ref 150–400)
RBC: 3.16 MIL/uL — ABNORMAL LOW (ref 3.87–5.11)
RDW: 14.8 % (ref 11.5–15.5)
WBC: 5.5 10*3/uL (ref 4.0–10.5)
nRBC: 0 % (ref 0.0–0.2)

## 2020-12-18 LAB — GLUCOSE, CAPILLARY
Glucose-Capillary: 208 mg/dL — ABNORMAL HIGH (ref 70–99)
Glucose-Capillary: 240 mg/dL — ABNORMAL HIGH (ref 70–99)
Glucose-Capillary: 248 mg/dL — ABNORMAL HIGH (ref 70–99)

## 2020-12-18 MED ORDER — OXYCODONE HCL 5 MG PO TABS
5.0000 mg | ORAL_TABLET | ORAL | Status: DC | PRN
  Administered 2020-12-18: 5 mg via ORAL
  Filled 2020-12-18: qty 1

## 2020-12-18 MED ORDER — OXYCODONE-ACETAMINOPHEN 5-325 MG PO TABS
1.0000 | ORAL_TABLET | ORAL | Status: DC | PRN
  Administered 2020-12-18: 1 via ORAL
  Filled 2020-12-18: qty 1

## 2020-12-18 NOTE — Care Management Important Message (Signed)
Important Message  Patient Details  Name: NORABELLE KONDO MRN: [id number] Date of Birth: 08-Jul-1946   Medicare Important Message Given:  Yes - Important Message mailed due to current National Emergency  Verbal consent obtained due to current National Emergency  Relationship to patient: Self Contact Name: Neely Call Date: 12/18/20  Time: 1135 Phone: [phone] Outcome: No Answer/Busy Important Message mailed to: Patient address on file    Delorse Lek 12/18/2020, 11:35 AM

## 2020-12-18 NOTE — Progress Notes (Signed)
Pt is A&O x4, right hip and right leg incision is clean and dry, open to air. Report was given to [place] at [facility]. Discharged pt via [place].

## 2020-12-18 NOTE — TOC Progression Note (Signed)
Transition of Care [facility]) - Progression Note    Patient Details  Name: Paula Steele MRN: [id number] Date of Birth: 09-10-1946  Transition of Care [facility]) CM/SW [place], [place] Phone Number: 12/18/2020, 10:18 AM  Clinical Narrative:     CSW spoke with [facility] facility this morning and they advised CSW they are not able to take unvaccinated patients at this time. CSW notified dtr and pt and they requested [facility] reviewed and extended a bed offer for this afternoon. CSW confirmed with pt and family that they would like to accept the bed offer. CSW will follow for updated DC summary and will DC this afternoon.  Expected Discharge Plan: [place] Barriers to Discharge: Continued Medical Work up,No SNF bed  Expected Discharge Plan and Services Expected Discharge Plan: [place]   Discharge Planning Services: CM Consult Post Acute Care Choice: [place] Living arrangements for the past 2 months: Single Family Home Expected Discharge Date: 12/15/20                                     Social Determinants of Health (SDOH) Interventions    Readmission Risk Interventions No flowsheet data found.

## 2020-12-18 NOTE — Progress Notes (Signed)
Inpatient Diabetes Program Recommendations  AACE/ADA: New Consensus Statement on Inpatient Glycemic Control (2015)  Target Ranges:  Prepandial:   less than 140 mg/dL      Peak postprandial:   less than 180 mg/dL (1-2 hours)      Critically ill patients:  140 - 180 mg/dL   Lab Results  Component Value Date   GLUCAP 240 (H) 12/18/2020   HGBA1C 8.6 (H) 12/11/2020    Review of Glycemic Control Results for Paula Steele, Paula Steele (MRN [id number]) as of 12/18/2020 10:08  Ref. Range 12/17/2020 08:24 12/17/2020 12:15 12/17/2020 16:01 12/17/2020 20:00 12/18/2020 00:19 12/18/2020 04:17 12/18/2020 08:01  Glucose-Capillary Latest Ref Range: 70 - 99 mg/dL 304 (H) 213 (H) 319 (H) 274 (H) 248 (H) 208 (H) 240 (H)   Diabetes history: DM 2 Outpatient Diabetes medications: Ozempic 1 mg QSunday, Tresiba 42 units Daily Current orders for Inpatient glycemic control:  Lantus 26 units Daily Novolog 0-9 units Q4 hours  A1c 8.6% on 2/21 Glucerna tid between meals  Inpatient Diabetes Program Recommendations:    -  Increase Lantus to 32 units -  Increase Novolog to "moderate" 0-15 units  Thanks, Tama Headings RN, MSN, BC-ADM Inpatient Diabetes Coordinator Team Pager [phone] (8a-5p)

## 2020-12-18 NOTE — Discharge Summary (Signed)
Physician Discharge Summary  Paula Steele KYH:[id number] DOB: 03-08-1946 DOA: 12/10/2020  PCP: Adaline Sill, NP  Admit date: 12/10/2020 Discharge date: 12/18/2020  Admitted From: Home  Disposition:  [facility]  Recommendations for Outpatient Follow-up:  1. Follow up with PCP in 1-2 weeks 2. Follow-up with orthopedics, Dr. Doreatha Martin as scheduled 3. Continue Lovenox for DVT prophylaxis per orthopedics   Discharge Condition: Stable CODE STATUS: Full code Diet recommendation: Heart healthy/consistent carbohydrate diet  History of present illness:  Paula Steele is a 75 year old female with past medical history significant for type 2 diabetes mellitus, essential hypertension, Karlene Lineman cirrhosis who presented to the ED following mechanical fall while leaving a store.  Denies loss of consciousness.  Patient complaining of severe right knee pain worse with movement.  Patient was found to have a distal right femur fracture complicated by prior right knee joint replacement.  Orthopedics was consulted.  Hospital service consulted for further evaluation and management of acute fracture.  Hospital course:  Closed periprosthetic right distal femoral fracture Patient presenting to the ED following mechanical fall while leaving a store with acute right knee pain.  On imaging, found to have a distal right femur fracture which is complicated by history of prior right knee joint replacement.  Orthopedics was consulted and patient underwent ORIF on 12/13/2020 by Dr. Doreatha Martin.  Continue partial weightbearing status (50%) to right lower extremity.  Continue Lovenox for DVT prophylaxis per orthopedic recommendations.  Discharging to SNF for further rehabilitation.  Type 2 diabetes mellitus, with hyperglycemia Hemoglobin A1c 8.6 on 12/11/2020, not well controlled.  Continue Ozempic and Tresiba 42 units subcutaneously daily.  Continue monitor blood glucose closely and adjust as needed.  Essential  hypertension Currently off antihypertensive medications.  Blood pressure well controlled, 124/67 at time of discharge.  Continue to follow-up outpatient.  Nash cirrhosis Stable.  Outpatient follow-up with gastroenterology  Discharge Diagnoses:  Principal Problem:   Closed fracture of right distal femur ([place]) Active Problems:   DM2 (diabetes mellitus, type 2) ([place])   NASH (nonalcoholic steatohepatitis)   Obesity   Hypertension    Discharge Instructions  Discharge Instructions    Call MD for:  redness, tenderness, or signs of infection (pain, swelling, redness, odor or green/yellow discharge around incision site)   Complete by: As directed    Call MD for:  severe uncontrolled pain   Complete by: As directed    Diet - low sodium heart healthy   Complete by: As directed    Diet Carb Modified   Complete by: As directed    Increase activity slowly   Complete by: As directed    Increase activity slowly   Complete by: As directed    No dressing needed   Complete by: As directed    No wound care   Complete by: As directed    Other Restrictions   Complete by: As directed    50% weightbearing right leg.     Allergies as of 12/18/2020      Reactions   Actos [pioglitazone]    edema   Bydureon [exenatide]    nausea   Metformin And Related    diahrrhea      Medication List    STOP taking these medications   diazepam 10 MG tablet Commonly known as: VALIUM   MOBIC PO   QUEtiapine 25 MG tablet Commonly known as: SEROQUEL     TAKE these medications   acetaminophen 325 MG tablet Commonly known as: TYLENOL Take  1-2 tablets (325-650 mg total) by mouth every 6 (six) hours as needed for mild pain (pain score 1-3 or temp > 100.5).   albuterol 108 (90 Base) MCG/ACT inhaler Commonly known as: VENTOLIN HFA Inhale 2 puffs into the lungs every 6 (six) hours as needed for wheezing or shortness of breath.   celecoxib 100 MG capsule Commonly known as: CELEBREX Take 100 mg by  mouth at bedtime.   diclofenac Sodium 1 % Gel Commonly known as: VOLTAREN Apply 2-4 g topically daily as needed (pain).   docusate sodium 100 MG capsule Commonly known as: COLACE Take 1 capsule (100 mg total) by mouth 2 (two) times daily.   enoxaparin 40 MG/0.4ML injection Commonly known as: LOVENOX Inject 0.4 mLs (40 mg total) into the skin daily.   fluticasone 50 MCG/ACT nasal spray Commonly known as: FLONASE Place 1 spray into both nostrils daily.   fluticasone-salmeterol 115-21 MCG/ACT inhaler Commonly known as: ADVAIR HFA Inhale 2 puffs into the lungs daily.   glucose blood test strip Commonly known as: Accu-Chek Guide Use as instructed   insulin aspart 100 UNIT/ML injection Commonly known as: novoLOG Inject 0-9 Units into the skin 3 (three) times daily with meals.   montelukast 10 MG tablet Commonly known as: SINGULAIR Take 10 mg by mouth at bedtime.   Narcan 4 MG/0.1ML Liqd nasal spray kit Generic drug: naloxone 1 spray once.   oxyCODONE-acetaminophen 10-325 MG tablet Commonly known as: PERCOCET Take 1 tablet by mouth every 6 (six) hours as needed for up to 5 days for pain. What changed: when to take this   Ozempic (1 MG/DOSE) 4 MG/3ML Sopn Generic drug: Semaglutide (1 MG/DOSE) Inject 1 mg into the skin every $RemoveBe'Sunday.   pregabalin 50 MG capsule Commonly known as: LYRICA Take 1 capsule (50 mg total) by mouth in the morning and at bedtime for 5 days.   PRILOSEC PO Take 20 mg by mouth daily as needed (acid reflux).   promethazine 25 MG tablet Commonly known as: PHENERGAN Take 25 mg by mouth every 6 (six) hours as needed for nausea or vomiting.   tiZANidine 4 MG tablet Commonly known as: ZANAFLEX Take 1 tablet (4 mg total) by mouth every 6 (six) hours as needed for muscle spasms.   Tresiba FlexTouch 100 UNIT/ML FlexTouch Pen Generic drug: insulin degludec Inject 42 Units into the skin daily.   vitamin C 500 MG tablet Commonly known as: ASCORBIC  ACID Take 500 mg by mouth daily.   Vitamin D (Cholecalciferol) 25 MCG (1000 UT) Caps Take 1,000 units of lipase by mouth in the morning and at bedtime.   vortioxetine HBr 10 MG Tabs tablet Commonly known as: TRINTELLIX Take 10 mg by mouth daily.            Discharge Care Instructions  (From admission, onward)         Start     Ordered   12/15/20 0000  No dressing needed        02'[id number]$ /25/22 1103          Contact information for follow-up providers    Haddix, Thomasene Lot, MD. Schedule an appointment as soon as possible for a visit in 2 week(s).   Specialty: Orthopedic Surgery Why: for suture removal, repeat x-rays Contact information: [place] [address] [phone]        Adaline Sill, NP. Schedule an appointment as soon as possible for a visit in 1 week(s).   Specialty: Internal Medicine Contact  information: [address] [place] [address] [phone]        Herminio Commons, MD .   Specialty: Cardiology Contact information: [place] [address] [phone]            Contact information for after-discharge care    Destination    [facility] .   Service: Skilled Nursing Contact information: [place] [address] [phone]                 Allergies  Allergen Reactions  . Actos [Pioglitazone]     edema  . Bydureon [Exenatide]     nausea  . Metformin And Related     diahrrhea    Consultations:  Orthopedics, Dr. Doreatha Martin   Procedures/Studies: DG Lumbar Spine Complete  Result Date: 12/11/2020 CLINICAL DATA:  Fall with back pain. EXAM: LUMBAR SPINE - COMPLETE 4+ VIEW COMPARISON:  10/08/2017 FINDINGS: No acute lumbar region finding. Five lumbar type vertebral bodies in normal alignment. Chronic disc space narrowing L2-3 through L4-5. Lower lumbar facet osteoarthritis. Cannot rule out a minimal superior endplate fracture at H41, but  this area was not well evaluated on the lumbar study. Is the pain higher, in the lower thoracic region? IMPRESSION: 1. No acute lumbar region finding. Chronic disc space narrowing L2-3 through L4-5. Lower lumbar facet osteoarthritis. 2. Cannot rule out a minimal T11 superior endplate fracture. Is the patient's pain in the lower thoracic region? Electronically Signed   By: Nelson Chimes M.D.   On: 12/11/2020 00:28   CT Head Wo Contrast  Result Date: 12/10/2020 CLINICAL DATA:  Trauma to the head and neck. EXAM: CT HEAD WITHOUT CONTRAST CT CERVICAL SPINE WITHOUT CONTRAST TECHNIQUE: Multidetector CT imaging of the head and cervical spine was performed following the standard protocol without intravenous contrast. Multiplanar CT image reconstructions of the cervical spine were also generated. COMPARISON:  12/06/2012.  03/16/2020. FINDINGS: CT HEAD FINDINGS Brain: Age related atrophy. Ordinary chronic small-vessel ischemic changes of the hemispheric white matter. No sign of acute infarction, mass lesion, hemorrhage, hydrocephalus or extra-axial collection. Vascular: There is atherosclerotic calcification of the major vessels at the base of the brain. Skull: Negative Sinuses/Orbits: Clear/normal Other: None CT CERVICAL SPINE FINDINGS Alignment: Normal Skull base and vertebrae: No fracture or primary bone lesion. Soft tissues and spinal canal: No significant soft tissue finding. Disc levels: Minimal mid cervical spondylosis. No significant narrowing of the canal or foramina identified. Mild facet osteoarthritis on the right at C2-3 and C3-4. Upper chest: Negative Other: None IMPRESSION: HEAD CT: No acute or traumatic finding. Age related atrophy and chronic small-vessel ischemic changes. CERVICAL SPINE CT: No acute or traumatic finding. Minimal spondylosis and facet osteoarthritis. Electronically Signed   By: Nelson Chimes M.D.   On: 12/10/2020 23:53   CT Cervical Spine Wo Contrast  Result Date: 12/10/2020 CLINICAL  DATA:  Trauma to the head and neck. EXAM: CT HEAD WITHOUT CONTRAST CT CERVICAL SPINE WITHOUT CONTRAST TECHNIQUE: Multidetector CT imaging of the head and cervical spine was performed following the standard protocol without intravenous contrast. Multiplanar CT image reconstructions of the cervical spine were also generated. COMPARISON:  12/06/2012.  03/16/2020. FINDINGS: CT HEAD FINDINGS Brain: Age related atrophy. Ordinary chronic small-vessel ischemic changes of the hemispheric white matter. No sign of acute infarction, mass lesion, hemorrhage, hydrocephalus or extra-axial collection. Vascular: There is atherosclerotic calcification of the major vessels at the base of the brain. Skull:  Negative Sinuses/Orbits: Clear/normal Other: None CT CERVICAL SPINE FINDINGS Alignment: Normal Skull base and vertebrae: No fracture or primary bone lesion. Soft tissues and spinal canal: No significant soft tissue finding. Disc levels: Minimal mid cervical spondylosis. No significant narrowing of the canal or foramina identified. Mild facet osteoarthritis on the right at C2-3 and C3-4. Upper chest: Negative Other: None IMPRESSION: HEAD CT: No acute or traumatic finding. Age related atrophy and chronic small-vessel ischemic changes. CERVICAL SPINE CT: No acute or traumatic finding. Minimal spondylosis and facet osteoarthritis. Electronically Signed   By: Nelson Chimes M.D.   On: 12/10/2020 23:53   DG Chest Port 1 View  Result Date: 12/11/2020 CLINICAL DATA:  Preoperative respiratory exam. EXAM: PORTABLE CHEST 1 VIEW COMPARISON:  April 19, 2020 FINDINGS: The heart size and mediastinal contours are within normal limits. Both lungs are clear. The visualized skeletal structures are unremarkable. IMPRESSION: No active cardiopulmonary disease. Electronically Signed   By: Virgina Norfolk M.D.   On: 12/11/2020 02:32   DG Knee Right Port  Result Date: 12/13/2020 CLINICAL DATA:  ORIF. EXAM: PORTABLE RIGHT KNEE - 1-2 VIEW COMPARISON:   04/19/2020. FINDINGS: Plate and screw fixation distal femoral fracture. Slight medial displacement of the distal fracture fragment. Total right knee replacement. Peripheral vascular calcification. IMPRESSION: Plate and screw fixation distal right femoral fracture. Slight medial displacement of the distal fracture fragment. Electronically Signed   By: Marcello Moores  Register   On: 12/13/2020 12:25   DG C-Arm 1-60 Min  Result Date: 12/13/2020 CLINICAL DATA:  Surgery, elective. Additional history provided: ORIF distal right femur. Provided fluoroscopy time 1 minutes, 10 seconds (5.67 mGy). EXAM: RIGHT FEMUR 2 VIEWS; DG C-ARM 1-60 MIN COMPARISON:  Radiographs of the right femoral 12/11/2020. FINDINGS: Eight intraoperative fluoroscopic images of the right femur are submitted. The images demonstrate ORIF of an acute periprosthetic distal right femur fracture utilizing a lateral plate and screws. There is some persistent displacement at the metadiaphyseal fracture site, but alignment is near anatomic. IMPRESSION: Eight intraoperative fluoroscopic images from ORIF of a periprosthetic distal right femur fracture, as described. Electronically Signed   By: Kellie Simmering DO   On: 12/13/2020 09:39   DG FEMUR, MIN 2 VIEWS RIGHT  Result Date: 12/13/2020 CLINICAL DATA:  Surgery, elective. Additional history provided: ORIF distal right femur. Provided fluoroscopy time 1 minutes, 10 seconds (5.67 mGy). EXAM: RIGHT FEMUR 2 VIEWS; DG C-ARM 1-60 MIN COMPARISON:  Radiographs of the right femoral 12/11/2020. FINDINGS: Eight intraoperative fluoroscopic images of the right femur are submitted. The images demonstrate ORIF of an acute periprosthetic distal right femur fracture utilizing a lateral plate and screws. There is some persistent displacement at the metadiaphyseal fracture site, but alignment is near anatomic. IMPRESSION: Eight intraoperative fluoroscopic images from ORIF of a periprosthetic distal right femur fracture, as described.  Electronically Signed   By: Kellie Simmering DO   On: 12/13/2020 09:39   DG Femur Min 2 Views Right  Result Date: 12/11/2020 CLINICAL DATA:  Fall with pain EXAM: RIGHT FEMUR 2 VIEWS COMPARISON:  04/19/2020 FINDINGS: Right hip and proximal femur appear unremarkable. Acute comminuted transverse fracture the distal femoral diaphysis just proximal to the distal metaphysis. Lateral displacement of the main distal fracture fragment, by half of the with of the bone. Fractures do not extend to the prosthetic femoral component itself. IMPRESSION: Acute comminuted transverse fracture of the distal femoral diaphysis just proximal to the distal metaphysis. Electronically Signed   By: Nelson Chimes M.D.   On: 12/11/2020 00:26  Subjective: Patient seen and examined bedside, resting comfortably.  Discharging to SNF today.  No other questions or concerns at this time.  Denies headache, no fever/chills/night sweats, no nausea vomiting/diarrhea, no chest pain, no palpitations, no shortness of breath, no abdominal pain.  No acute concerns overnight per nursing staff.  Discharge Exam: Vitals:   12/18/20 0812 12/18/20 0900  BP:  120/71  Pulse:  95  Resp:    Temp:  98.2 F (36.8 C)  SpO2: 94% 95%   Vitals:   12/17/20 1300 12/17/20 2005 12/18/20 0812 12/18/20 0900  BP: (!) 123/57 124/67  120/71  Pulse: 91 91  95  Resp: 18 20    Temp: 98.6 F (37 C) 98.7 F (37.1 C)  98.2 F (36.8 C)  TempSrc: Oral Oral  Oral  SpO2: 94% 92% 94% 95%  Weight:      Height:        General: Pt is alert, awake, not in acute distress Cardiovascular: RRR, S1/S2 +, no rubs, no gallops Respiratory: CTA bilaterally, no wheezing, no rhonchi Abdominal: Soft, NT, ND, bowel sounds + Extremities: no edema, no cyanosis, surgical incision site noted, clean/dry/intact without erythema or fluctuance.  Sutures noted.    The results of significant diagnostics from this hospitalization (including imaging, microbiology, ancillary and  laboratory) are listed below for reference.     Microbiology: Recent Results (from the past 240 hour(s))  SARS CORONAVIRUS 2 (TAT 6-24 HRS) Nasopharyngeal Nasopharyngeal Swab     Status: None   Collection Time: 12/11/20  2:08 AM   Specimen: Nasopharyngeal Swab  Result Value Ref Range Status   SARS Coronavirus 2 NEGATIVE NEGATIVE Final    Comment: (NOTE) SARS-CoV-2 target nucleic acids are NOT DETECTED.  The SARS-CoV-2 RNA is generally detectable in upper and lower respiratory specimens during the acute phase of infection. Negative results do not preclude SARS-CoV-2 infection, do not rule out co-infections with other pathogens, and should not be used as the sole basis for treatment or other patient management decisions. Negative results must be combined with clinical observations, patient history, and epidemiological information. The expected result is Negative.  Fact Sheet for Patients: HairSlick.no  Fact Sheet for Healthcare Providers: quierodirigir.com  This test is not yet approved or cleared by the [place] FDA and  has been authorized for detection and/or diagnosis of SARS-CoV-2 by FDA under an Emergency Use Authorization (EUA). This EUA will remain  in effect (meaning this test can be used) for the duration of the COVID-19 declaration under Se ction 564(b)(1) of the Act, 21 U.S.C. section 360bbb-3(b)(1), unless the authorization is terminated or revoked sooner.  Performed at [facility], [address] [place], [place] [address]   Surgical pcr screen     Status: None   Collection Time: 12/11/20  6:14 PM   Specimen: Nasal Mucosa; Nasal Swab  Result Value Ref Range Status   MRSA, PCR NEGATIVE NEGATIVE Final   Staphylococcus aureus NEGATIVE NEGATIVE Final    Comment: (NOTE) The Xpert SA Assay (FDA approved for NASAL specimens in patients 62 years of age and older), is one component of a  comprehensive surveillance program. It is not intended to diagnose infection nor to guide or monitor treatment. Performed at [facility], [address] [place], [place] [address]   SARS CORONAVIRUS 2 (TAT 6-24 HRS) Nasopharyngeal Nasopharyngeal Swab     Status: None   Collection Time: 12/17/20  3:05 PM   Specimen: Nasopharyngeal Swab  Result Value Ref  Range Status   SARS Coronavirus 2 NEGATIVE NEGATIVE Final    Comment: (NOTE) SARS-CoV-2 target nucleic acids are NOT DETECTED.  The SARS-CoV-2 RNA is generally detectable in upper and lower respiratory specimens during the acute phase of infection. Negative results do not preclude SARS-CoV-2 infection, do not rule out co-infections with other pathogens, and should not be used as the sole basis for treatment or other patient management decisions. Negative results must be combined with clinical observations, patient history, and epidemiological information. The expected result is Negative.  Fact Sheet for Patients: SugarRoll.be  Fact Sheet for Healthcare Providers: https://www.woods-mathews.com/  This test is not yet approved or cleared by the [place] FDA and  has been authorized for detection and/or diagnosis of SARS-CoV-2 by FDA under an Emergency Use Authorization (EUA). This EUA will remain  in effect (meaning this test can be used) for the duration of the COVID-19 declaration under Se ction 564(b)(1) of the Act, 21 U.S.C. section 360bbb-3(b)(1), unless the authorization is terminated or revoked sooner.  Performed at [facility], [place] [address] [place], [place] [address]      Labs: BNP (last 3 results) No results for input(s): BNP in the last 8760 hours. Basic Metabolic Panel: Recent Labs  Lab 12/13/20 1245 12/14/20 0203 12/15/20 0238 12/16/20 0142  NA  --  137 138 136  K  --  4.2 4.2 4.1  CL  --  105 104 105  CO2  --  $R'24 26 23  'zj$ GLUCOSE  --  219*  283* 269*  BUN  --  $R'18 21 20  'nR$ CREATININE 0.60 0.60 0.67 0.62  CALCIUM  --  8.6* 9.0 9.0   Liver Function Tests: Recent Labs  Lab 12/11/20 1811  AST 36  ALT 33  ALKPHOS 125  BILITOT 0.7  PROT 7.9  ALBUMIN 3.6   No results for input(s): LIPASE, AMYLASE in the last 168 hours. No results for input(s): AMMONIA in the last 168 hours. CBC: Recent Labs  Lab 12/14/20 0203 12/15/20 0238 12/16/20 0142 12/17/20 0049 12/18/20 0108  WBC 7.1 5.4 5.8 5.7 5.5  HGB 9.3* 8.8* 9.2* 9.0* 9.2*  HCT 26.9* 27.3* 26.9* 27.5* 27.4*  MCV 85.1 87.8 85.7 87.0 86.7  PLT 137* 129* 149* 161 184   Cardiac Enzymes: No results for input(s): CKTOTAL, CKMB, CKMBINDEX, TROPONINI in the last 168 hours. BNP: Invalid input(s): POCBNP CBG: Recent Labs  Lab 12/17/20 1601 12/17/20 2000 12/18/20 0019 12/18/20 0417 12/18/20 0801  GLUCAP 319* 274* 248* 208* 240*   D-Dimer No results for input(s): DDIMER in the last 72 hours. Hgb A1c No results for input(s): HGBA1C in the last 72 hours. Lipid Profile No results for input(s): CHOL, HDL, LDLCALC, TRIG, CHOLHDL, LDLDIRECT in the last 72 hours. Thyroid function studies No results for input(s): TSH, T4TOTAL, T3FREE, THYROIDAB in the last 72 hours.  Invalid input(s): FREET3 Anemia work up No results for input(s): VITAMINB12, FOLATE, FERRITIN, TIBC, IRON, RETICCTPCT in the last 72 hours. Urinalysis    Component Value Date/Time   COLORURINE YELLOW 12/11/2020 0851   APPEARANCEUR CLEAR 12/11/2020 0851   LABSPEC 1.027 12/11/2020 0851   PHURINE 6.0 12/11/2020 0851   GLUCOSEU >=500 (A) 12/11/2020 0851   HGBUR NEGATIVE 12/11/2020 0851   BILIRUBINUR NEGATIVE 12/11/2020 0851   KETONESUR NEGATIVE 12/11/2020 0851   PROTEINUR NEGATIVE 12/11/2020 0851   UROBILINOGEN 0.2 12/06/2012 1934   NITRITE NEGATIVE 12/11/2020 0851   LEUKOCYTESUR NEGATIVE 12/11/2020 0851   Sepsis Labs Invalid input(s): PROCALCITONIN,  WBC,  LACTICIDVEN Microbiology Recent Results (from  the past 240 hour(s))  SARS CORONAVIRUS 2 (TAT 6-24 HRS) Nasopharyngeal Nasopharyngeal Swab     Status: None   Collection Time: 12/11/20  2:08 AM   Specimen: Nasopharyngeal Swab  Result Value Ref Range Status   SARS Coronavirus 2 NEGATIVE NEGATIVE Final    Comment: (NOTE) SARS-CoV-2 target nucleic acids are NOT DETECTED.  The SARS-CoV-2 RNA is generally detectable in upper and lower respiratory specimens during the acute phase of infection. Negative results do not preclude SARS-CoV-2 infection, do not rule out co-infections with other pathogens, and should not be used as the sole basis for treatment or other patient management decisions. Negative results must be combined with clinical observations, patient history, and epidemiological information. The expected result is Negative.  Fact Sheet for Patients: SugarRoll.be  Fact Sheet for Healthcare Providers: https://www.woods-mathews.com/  This test is not yet approved or cleared by the [place] FDA and  has been authorized for detection and/or diagnosis of SARS-CoV-2 by FDA under an Emergency Use Authorization (EUA). This EUA will remain  in effect (meaning this test can be used) for the duration of the COVID-19 declaration under Se ction 564(b)(1) of the Act, 21 U.S.C. section 360bbb-3(b)(1), unless the authorization is terminated or revoked sooner.  Performed at [facility], [place] [address] [place], [place] [address]   Surgical pcr screen     Status: None   Collection Time: 12/11/20  6:14 PM   Specimen: Nasal Mucosa; Nasal Swab  Result Value Ref Range Status   MRSA, PCR NEGATIVE NEGATIVE Final   Staphylococcus aureus NEGATIVE NEGATIVE Final    Comment: (NOTE) The Xpert SA Assay (FDA approved for NASAL specimens in patients 56 years of age and older), is one component of a comprehensive surveillance program. It is not intended to diagnose infection nor to guide or monitor  treatment. Performed at [facility], [place] [address] [place], [place] [address]   SARS CORONAVIRUS 2 (TAT 6-24 HRS) Nasopharyngeal Nasopharyngeal Swab     Status: None   Collection Time: 12/17/20  3:05 PM   Specimen: Nasopharyngeal Swab  Result Value Ref Range Status   SARS Coronavirus 2 NEGATIVE NEGATIVE Final    Comment: (NOTE) SARS-CoV-2 target nucleic acids are NOT DETECTED.  The SARS-CoV-2 RNA is generally detectable in upper and lower respiratory specimens during the acute phase of infection. Negative results do not preclude SARS-CoV-2 infection, do not rule out co-infections with other pathogens, and should not be used as the sole basis for treatment or other patient management decisions. Negative results must be combined with clinical observations, patient history, and epidemiological information. The expected result is Negative.  Fact Sheet for Patients: SugarRoll.be  Fact Sheet for Healthcare Providers: https://www.woods-mathews.com/  This test is not yet approved or cleared by the [place] FDA and  has been authorized for detection and/or diagnosis of SARS-CoV-2 by FDA under an Emergency Use Authorization (EUA). This EUA will remain  in effect (meaning this test can be used) for the duration of the COVID-19 declaration under Se ction 564(b)(1) of the Act, 21 U.S.C. section 360bbb-3(b)(1), unless the authorization is terminated or revoked sooner.  Performed at [facility], [place] [address] [place], [place] [address]      Time coordinating discharge: Over 30 minutes  SIGNED:   Shreyan Hinz J [place] DO  [facility] 12/18/2020, 10:57 AM

## 2020-12-18 NOTE — TOC Transition Note (Signed)
Transition of Care [facility]) - CM/SW Discharge Note   Patient Details  Name: Paula Steele MRN: [id number] Date of Birth: Nov 07, 1945  Transition of Care [facility]) CM/SW Contact:  Coralee Pesa, [place] Phone Number: 12/18/2020, 10:24 AM   Clinical Narrative:    [place] Nurse to call report to [phone].   Final next level of care: Skilled Nursing Facility Barriers to Discharge: Barriers Resolved   Patient Goals and CMS Choice   CMS Medicare.gov Compare Post Acute Care list provided to:: Patient    Discharge Placement              Patient chooses bed at:  [facility]) Patient to be transferred to facility by: [place] Name of family member notified: Rhonda Patient and family notified of of transfer: 12/18/20  Discharge Plan and Services   Discharge Planning Services: CM Consult Post Acute Care Choice: [place]                               Social Determinants of Health (SDOH) Interventions     Readmission Risk Interventions No flowsheet data found.

## 2020-12-21 NOTE — Progress Notes (Signed)
Cardiology Office Note  Date: 12/22/2020   ID: Paula Steele, DOB 1946/07/07, MRN [id number]  PCP:  Adaline Sill, NP  Cardiologist:  No primary care provider on file. Electrophysiologist:  None   Chief Complaint: Hospital follow up.   History of Present Illness: Paula Steele is a 75 y.o. female with a history of HTN, HLD, CHF, IDDM, obesity, anxiety, GERD, chronic pain. NASH.   Last seen by Dr. Bronson Ing 06/17/2019 for evaluation of CHF.  Previous echo 201 14 LVEF 55 to 60%.  Trivial pericardial effusion.  Having intermittent retrosternal chest pain ongoing for years but becoming progressively worse.  Occurring with and without exertion.  Radiation to upper left chest and under her shoulder and down her arm.  Having sporadic palpitations.  Progressively more short of breath.  Having bilateral leg swelling.  Coronary CTA was ordered.  Lab work was being requested from PCP.  Recent R distal femur periprosthetic fracture. Underwent ORIF 12/13/2020.   She is here for hospital follow-up. She is currently at [facility] rehab for right femur fracture. States she has been having some increased shortness of breath with exertion. Also complaining of fluttering/palpitations in her heart. States she occasionally feels some mild dizziness when this occurs. Complaining of claudication-like symptoms when walking. Blood pressure is well controlled today at 120/62. She has some mild lower extremity edema more so on the right leg which is the recent operative leg. Denies any CVA or TIA-like symptoms, PND, orthopnea.  Past Medical History:  Diagnosis Date  . Arthritis     SHOULDERS--   IS CURRENTLY HAS LEFT SHOULDER PAIN  . Benign neoplasm of adrenal gland   . Benign neoplasm of colon   . Blood transfusion   . Bronchitis    10/2012- followed by ER and then PCP- Dr Melina Copa   . Chronic kidney disease    bladder incontinence   . Chronic meniscal tear of knee, right   . Colon polyp   . Degeneration of  intervertebral disc, site unspecified    LUMBAR  . Diabetes mellitus without mention of complication    INSULIN AND ORAL MEDS  . Diverticulosis of colon (without mention of hemorrhage)   . Esophageal reflux    CONTROLLED W/ PRILOSEC  . Femur fracture, right ([place]) 12/10/2020   closed intial encouter  . History of neck pain    chronic left sided neck pain  . Hx: UTI (urinary tract infection)   . Hyperlipidemia   . Hypertension   . Insomnia DUE TO STRESS  . Left arm pain    and  heaviness since radical neck surg  . Neck pain, chronic S/P RADIAL NECK DISSECTION--  2007   LEFT SIDE OF NECK AND SHOULDER PAIN  . Oropharyngeal dysphagia MILD RIGHT SUPRAGLOTTIC REGION   PT STATES SHE EATS VERY SLOWLY AND CHEWS FOOD WELL  . Other chronic nonalcoholic liver disease    SEES DR Deatra Ina-- ?SCARRING OR CIRRHOSIS PER PET SCAN 2010  . Personal history of other diseases of digestive system    OCCASIONAL ABD. PAIN  . 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 Shortness of breath   . Shortness of breath on exertion   . 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 Urgency incontinence   . Vitamin D deficiency   . Weakness of both legs  Past Surgical History:  Procedure Laterality Date  . ABDOMINAL HYSTERECTOMY  1984   W/  LSO  . CATARACT EXTRACTION W/ INTRAOCULAR LENS  IMPLANT, BILATERAL    . CHOLECYSTECTOMY  15 YRS AGO  . DIRECT LARYNGOSCOPY  2007   W/ LEFT NECK MASS EXCISION  . EXCISION LEFT SEBACOUS CYST  05-14-2011 W/  LOCAL  . KNEE ARTHROSCOPY  09/26/2011   Procedure: ARTHROSCOPY KNEE;  Surgeon: Gearlean Alf;  Location: [place];  Service: Orthopedics;  Laterality: Right;  right knee arthroscopy with meniscal tear  . KNEE ARTHROSCOPY  03/25/2012   Procedure: ARTHROSCOPY KNEE;  Surgeon: Gearlean Alf, MD;  Location: [facility];  Service: Orthopedics;   Laterality: Right;  With Debridement of medial lateral meniscus  . LAPAROSCOPIC SALPINGOOPHERECTOMY  2001   RIGHT  W/ LYSIS AHESIONS  . NECK MASS EXCISION  2007   LEFT  . OPEN REDUCTION INTERNAL FIXATION (ORIF) DISTAL FEMUR FRACTURE (Right )  12/13/2020  . ORIF FEMUR FRACTURE Right 12/13/2020   Procedure: OPEN REDUCTION INTERNAL FIXATION (ORIF) DISTAL FEMUR FRACTURE;  Surgeon: Shona Needles, MD;  Location: [place];  Service: Orthopedics;  Laterality: Right;  . RADICAL NECK DISSECTION  2007   W/ LEFT TONSILLECTOMY AND LEFT EXCISION LEFT TONGUE BASE DUE TO CANCER  . SHOULDER SURGERY  1990   LEFT  . TONSILLECTOMY  01-31-2009   RIGHT (DUE TO MASS)  . TONSILLECTOMY  2007   LEFT W/ NECK DISSECTION DUE TO CANCER  . TOTAL KNEE ARTHROPLASTY Right 11/30/2012   Procedure: TOTAL KNEE ARTHROPLASTY;  Surgeon: Gearlean Alf, MD;  Location: [facility];  Service: Orthopedics;  Laterality: Right;    Current Outpatient Medications  Medication Sig Dispense Refill  . acetaminophen (TYLENOL) 325 MG tablet Take 1-2 tablets (325-650 mg total) by mouth every 6 (six) hours as needed for mild pain (pain score 1-3 or temp > 100.5).    Marland Kitchen albuterol (VENTOLIN HFA) 108 (90 Base) MCG/ACT inhaler Inhale 2 puffs into the lungs every 6 (six) hours as needed for wheezing or shortness of breath.    . celecoxib (CELEBREX) 100 MG capsule Take 100 mg by mouth at bedtime.    . diclofenac Sodium (VOLTAREN) 1 % GEL Apply 2-4 g topically daily as needed (pain).    Marland Kitchen docusate sodium (COLACE) 100 MG capsule Take 1 capsule (100 mg total) by mouth 2 (two) times daily. 10 capsule 0  . enoxaparin (LOVENOX) 40 MG/0.4ML injection Inject 0.4 mLs (40 mg total) into the skin daily. 12 mL 0  . fluticasone (FLONASE) 50 MCG/ACT nasal spray Place 1 spray into both nostrils daily.    . fluticasone-salmeterol (ADVAIR HFA) 115-21 MCG/ACT inhaler Inhale 2 puffs into the lungs daily.    . insulin aspart (NOVOLOG) 100 UNIT/ML injection Inject 0-9 Units  into the skin 3 (three) times daily with meals. 10 mL 11  . montelukast (SINGULAIR) 10 MG tablet Take 10 mg by mouth at bedtime.    Marland Kitchen NARCAN 4 MG/0.1ML LIQD nasal spray kit 1 spray once.    . Omeprazole (PRILOSEC PO) Take 20 mg by mouth daily as needed (acid reflux).    Marland Kitchen oxyCODONE-acetaminophen (PERCOCET) 10-325 MG tablet Take 1 tablet by mouth every 6 (six) hours as needed for pain.    Marland Kitchen OZEMPIC, 1 MG/DOSE, 4 MG/3ML SOPN Inject 1 mg into the skin every Sunday.    . pregabalin (LYRICA) 50 MG capsule Take 50 mg by mouth 2 (two) times daily.    Marland Kitchen  promethazine (PHENERGAN) 25 MG tablet Take 25 mg by mouth every 6 (six) hours as needed for nausea or vomiting.    Marland Kitchen tiZANidine (ZANAFLEX) 4 MG tablet Take 1 tablet (4 mg total) by mouth every 6 (six) hours as needed for muscle spasms. 28 tablet 0  . TRESIBA FLEXTOUCH 100 UNIT/ML SOPN FlexTouch Pen Inject 42 Units into the skin daily.    . vitamin C (ASCORBIC ACID) 500 MG tablet Take 500 mg by mouth daily.    . Vitamin D, Cholecalciferol, 25 MCG (1000 UT) CAPS Take 1,000 units of lipase by mouth in the morning and at bedtime.    . vortioxetine HBr (TRINTELLIX) 10 MG TABS tablet Take 10 mg by mouth daily.    Marland Kitchen glucose blood (ACCU-CHEK GUIDE) test strip Use as instructed 150 each 2   No current facility-administered medications for this visit.   Allergies:  Actos [pioglitazone], Bydureon [exenatide], and Metformin and related   Social History: The patient  reports that she quit smoking about 36 years ago. Her smoking use included cigarettes. She quit after 15.00 years of use. She has never used smokeless tobacco. She reports that she does not drink alcohol and does not use drugs.   Family History: The patient's family history includes Diabetes in her brother and mother; Heart disease in her brother and father; Thyroid cancer in her brother.   ROS:  Please see the history of present illness. Otherwise, complete review of systems is positive for none.  All  other systems are reviewed and negative.   Physical Exam: VS:  BP 120/62   Pulse 86   Ht $R'5\' 8"'Tf$  (1.727 m)   Wt 213 lb 12.8 oz (97 kg)   SpO2 97%   BMI 32.51 kg/m , BMI Body mass index is 32.51 kg/m.  Wt Readings from Last 3 Encounters:  12/22/20 213 lb 12.8 oz (97 kg)  12/14/20 203 lb (92.1 kg)  04/19/20 219 lb (99.3 kg)    General: Patient appears comfortable at rest. Neck: Supple, no elevated JVP or carotid bruits, no thyromegaly. Lungs: Clear to auscultation, nonlabored breathing at rest. Cardiac: Regular rate and rhythm, no S3 or significant systolic murmur, no pericardial rub. Extremities: No pitting edema, distal pulses 2+. Skin: Warm and dry. Musculoskeletal: No kyphosis. Neuropsychiatric: Alert and oriented x3, affect grossly appropriate.  ECG:  12/11/2020 EKG sinus rhythm with a rate of 99.  Recent Labwork: 12/11/2020: ALT 33; AST 36 12/16/2020: BUN 20; Creatinine, Ser 0.62; Potassium 4.1; Sodium 136 12/18/2020: Hemoglobin 9.2; Platelets 184     Component Value Date/Time   CHOL 120 08/07/2016 0000   TRIG 132 08/07/2016 0000   HDL 50 08/07/2016 0000   CHOLHDL 4.5 12/08/2012 0449   VLDL 20 12/08/2012 0449   LDLCALC 44 08/07/2016 0000    Other Studies Reviewed Today:  Echocardiogram 06/25/2019  1. Left ventricular ejection fraction, by visual estimation, is 55 to 60%. The left ventricle has normal function. There is mildly increased left ventricular hypertrophy. 2. Global right ventricle has normal systolic function.The right ventricular size is normal. No increase in right ventricular wall thickness. 3. Left atrial size was normal. 4. Right atrial size was normal. 5. Mild mitral annular calcification. 6. The mitral valve is grossly normal. Trace mitral valve regurgitation. 7. The tricuspid valve is grossly normal. Tricuspid valve regurgitation is trivial. 8. The aortic valve is tricuspid Aortic valve regurgitation was not visualized by color flow Doppler. Mild  aortic valve sclerosis without stenosis. 9. The pulmonic valve  was grossly normal. Pulmonic valve regurgitation is trivial by color flow Doppler. 10. TR signal is inadequate for assessing pulmonary artery systolic pressure. 11. The inferior vena cava is normal in size with greater than 50% respiratory variability, suggesting right atrial pressure of 3 mmHg. In comparison to the previous echocardiogram(s): Unable to compare with prior study images. Left Ventricle: Left ventricular ejection fraction, by visual estimation, is 55 to 60%. The left ventricle has normal function. There is mildly increased left ventricular hypertrophy. Spectral Doppler shows Left ventricular diastolic parameters were normal pattern of LV diastolic filling. Right Ventricle: The right ventricular size is normal. No increase in right ventricular wall thickness. Global RV systolic function is has normal systolic function. Left Atrium: Left atrial size was normal in size. Right Atrium: Right atrial size was normal in size Pericardium: There is no evidence of pericardial effusion.   Assessment and Plan:  1. Chest pain, unspecified type   2. SOB (shortness of breath)   3. Insulin dependent diabetes mellitus type IA ([place])   4. Essential hypertension   5. Mixed hyperlipidemia   6. Pain of lower extremity, unspecified laterality   7. Palpitations    1. Chest pain, unspecified type Currently denies any chest pain. Does have some increased dyspnea on exertion.  2. SOB (shortness of breath) Currently complaining of increasing shortness of breath/dyspnea on exertion when ambulating. Having some mild lower extremity edema. Please get a repeat echocardiogram.  3. Insulin dependent diabetes mellitus type IA ([place]) Managed by PCP patient states her most recent hemoglobin A1c was around 7%.  4. Essential hypertension Blood pressure well controlled today. BP 120/62. Currently not on any antihypertensive medication.  5.  Mixed hyperlipidemia Not on statin medications. Daughter states she was not able to tolerate statins in the past. Intolerant of Crestor and Zocor.  6. Palpitations Recent complaints of fluttering/palpitations in her chest. Please get a 14-day ZIO monitor.  7. Claudication/leg pain Recently complaining of calf and thigh pain when ambulating relieved at rest. States she was told in the past she had bad blood flow in both of her legs. Please get lower extremity arterial duplex with ABIs.   Medication Adjustments/Labs and Tests Ordered: Current medicines are reviewed at length with the patient today.  Concerns regarding medicines are outlined above.   Disposition: Follow-up with Dr. Harl Bowie or APP 6 to 8 weeks  Signed, Levell July, NP 12/22/2020 10:07 AM    [facility] [place], [place],  [id number] Phone: [phone]; Fax: [phone]

## 2020-12-22 ENCOUNTER — Encounter: Payer: Self-pay | Admitting: Family Medicine

## 2020-12-22 ENCOUNTER — Ambulatory Visit (INDEPENDENT_AMBULATORY_CARE_PROVIDER_SITE_OTHER): Payer: Medicare Other | Admitting: Family Medicine

## 2020-12-22 VITALS — BP 120/62 | HR 86 | Ht 68.0 in | Wt 213.8 lb

## 2020-12-22 DIAGNOSIS — E109 Type 1 diabetes mellitus without complications: Secondary | ICD-10-CM | POA: Diagnosis not present

## 2020-12-22 DIAGNOSIS — R079 Chest pain, unspecified: Secondary | ICD-10-CM | POA: Diagnosis not present

## 2020-12-22 DIAGNOSIS — R002 Palpitations: Secondary | ICD-10-CM

## 2020-12-22 DIAGNOSIS — I10 Essential (primary) hypertension: Secondary | ICD-10-CM | POA: Diagnosis not present

## 2020-12-22 DIAGNOSIS — E782 Mixed hyperlipidemia: Secondary | ICD-10-CM

## 2020-12-22 DIAGNOSIS — R0602 Shortness of breath: Secondary | ICD-10-CM

## 2020-12-22 DIAGNOSIS — M79606 Pain in leg, unspecified: Secondary | ICD-10-CM

## 2020-12-22 NOTE — Patient Instructions (Addendum)
Medication Instructions:  Continue all current medications.  Labwork: none  Testing/Procedures:  Your physician has requested that you have an echocardiogram. Echocardiography is a painless test that uses sound waves to create images of your heart. It provides your doctor with information about the size and shape of your heart and how well your heart's chambers and valves are working. This procedure takes approximately one hour. There are no restrictions for this procedure.  Your physician has recommended that you wear a 14 day event monitor. Event monitors are medical devices that record the heart's electrical activity. Doctors most often [place] these monitors to diagnose arrhythmias. Arrhythmias are problems with the speed or rhythm of the heartbeat. The monitor is a small, portable device. You can wear one while you do your normal daily activities. This is usually used to diagnose what is causing palpitations/syncope (passing out).  Your physician has requested that you have an ankle brachial index (ABI). During this test an ultrasound and blood pressure cuff are used to evaluate the arteries that supply the arms and legs with blood. Allow thirty minutes for this exam. There are no restrictions or special instructions.  Your physician has requested that you have a lower extremity arterial duplex. During this test, ultrasound is used to evaluate arterial blood flow in the legs. Allow one hour for this exam. There are no restrictions or special instructions.  Office will contact with results via phone or letter.    Follow-Up: 6-8 weeks   Any Other Special Instructions Will Be Listed Below (If Applicable).  If you need a refill on your cardiac medications before your next appointment, please call your pharmacy.

## 2020-12-26 ENCOUNTER — Other Ambulatory Visit: Payer: Self-pay | Admitting: Cardiology

## 2020-12-26 ENCOUNTER — Ambulatory Visit: Payer: Medicare Other

## 2020-12-26 DIAGNOSIS — I498 Other specified cardiac arrhythmias: Secondary | ICD-10-CM

## 2021-01-03 ENCOUNTER — Other Ambulatory Visit: Payer: Self-pay | Admitting: Family Medicine

## 2021-01-03 DIAGNOSIS — I739 Peripheral vascular disease, unspecified: Secondary | ICD-10-CM

## 2021-02-01 ENCOUNTER — Ambulatory Visit (INDEPENDENT_AMBULATORY_CARE_PROVIDER_SITE_OTHER): Payer: Medicare Other

## 2021-02-01 ENCOUNTER — Ambulatory Visit: Payer: Medicare Other

## 2021-02-01 ENCOUNTER — Other Ambulatory Visit: Payer: Self-pay

## 2021-02-01 DIAGNOSIS — I739 Peripheral vascular disease, unspecified: Secondary | ICD-10-CM | POA: Diagnosis not present

## 2021-02-01 DIAGNOSIS — R0602 Shortness of breath: Secondary | ICD-10-CM

## 2021-02-02 LAB — ECHOCARDIOGRAM COMPLETE
AR max vel: 2.25 cm2
AV Area VTI: 2.08 cm2
AV Area mean vel: 2.3 cm2
AV Mean grad: 4.4 mmHg
AV Peak grad: 7.5 mmHg
Ao pk vel: 1.37 m/s
Calc EF: 59.6 %
S' Lateral: 3.11 cm
Single Plane A2C EF: 56.5 %
Single Plane A4C EF: 62.9 %

## 2021-02-05 ENCOUNTER — Telehealth: Payer: Self-pay | Admitting: *Deleted

## 2021-02-05 NOTE — Telephone Encounter (Signed)
Laurine Blazer, LPN  1/85/5015 8:68 PM EDT Back to Top     Notified, copy to pcp.

## 2021-02-05 NOTE — Telephone Encounter (Signed)
Laurine Blazer, LPN  6/56/8127 5:17 PM EDT Back to Top     Patient notified via detailed voice message, copy to pcp.

## 2021-02-05 NOTE — Telephone Encounter (Signed)
-----   Message from Verta Ellen., NP sent at 02/02/2021  1:15 PM EDT ----- Her lower extremity Doppler did not show any evidence of peripheral arterial disease.  No evidence of lack of blood flow through the arteries in her legs

## 2021-02-05 NOTE — Telephone Encounter (Signed)
-----   Message from Verta Ellen., NP sent at 02/02/2021  1:16 PM EDT ----- Echocardiogram showed she has good pumping function of the heart.  Very slightly leaking mitral valve.

## 2021-02-06 ENCOUNTER — Telehealth: Payer: Self-pay | Admitting: Family Medicine

## 2021-02-06 ENCOUNTER — Ambulatory Visit: Payer: TRICARE For Life (TFL) | Admitting: Family Medicine

## 2021-02-06 NOTE — Telephone Encounter (Signed)
Verta Ellen., NP  02/02/2021 1:16 PM EDT      Echocardiogram showed she has good pumping function of the heart. Very slightly leaking mitral valve.   Apologized for any confusion - reviewed test results above as given last pm.

## 2021-02-06 NOTE — Telephone Encounter (Signed)
Patient returned Gayle's call. She was concerned because the message she said she left referred to her as Mr. Jeneen Rinks. She said she was probably calling with her test results from 4/14. She would like Gayle to call her back [phone].

## 2021-05-31 ENCOUNTER — Other Ambulatory Visit: Payer: Self-pay

## 2021-05-31 ENCOUNTER — Emergency Department (HOSPITAL_COMMUNITY): Payer: Medicare Other

## 2021-05-31 ENCOUNTER — Encounter (HOSPITAL_COMMUNITY): Payer: Self-pay | Admitting: *Deleted

## 2021-05-31 ENCOUNTER — Emergency Department (HOSPITAL_COMMUNITY)
Admission: EM | Admit: 2021-05-31 | Discharge: 2021-05-31 | Disposition: A | Discharge status: Home / Self Care | Payer: Medicare Other | Attending: Emergency Medicine | Admitting: Emergency Medicine

## 2021-05-31 DIAGNOSIS — Z87891 Personal history of nicotine dependence: Secondary | ICD-10-CM | POA: Diagnosis not present

## 2021-05-31 DIAGNOSIS — Z85810 Personal history of malignant neoplasm of tongue: Secondary | ICD-10-CM | POA: Insufficient documentation

## 2021-05-31 DIAGNOSIS — Z79899 Other long term (current) drug therapy: Secondary | ICD-10-CM | POA: Diagnosis not present

## 2021-05-31 DIAGNOSIS — Z96651 Presence of right artificial knee joint: Secondary | ICD-10-CM | POA: Insufficient documentation

## 2021-05-31 DIAGNOSIS — I129 Hypertensive chronic kidney disease with stage 1 through stage 4 chronic kidney disease, or unspecified chronic kidney disease: Secondary | ICD-10-CM | POA: Diagnosis not present

## 2021-05-31 DIAGNOSIS — E1122 Type 2 diabetes mellitus with diabetic chronic kidney disease: Secondary | ICD-10-CM | POA: Diagnosis not present

## 2021-05-31 DIAGNOSIS — M25551 Pain in right hip: Secondary | ICD-10-CM | POA: Insufficient documentation

## 2021-05-31 DIAGNOSIS — M79651 Pain in right thigh: Secondary | ICD-10-CM | POA: Insufficient documentation

## 2021-05-31 DIAGNOSIS — Z794 Long term (current) use of insulin: Secondary | ICD-10-CM | POA: Diagnosis not present

## 2021-05-31 DIAGNOSIS — R52 Pain, unspecified: Secondary | ICD-10-CM

## 2021-05-31 DIAGNOSIS — N189 Chronic kidney disease, unspecified: Secondary | ICD-10-CM | POA: Insufficient documentation

## 2021-05-31 MED ORDER — LIDOCAINE 5 % EX PTCH: 1.0000 | MEDICATED_PATCH | CUTANEOUS | 0 refills | Status: DC

## 2021-05-31 MED ORDER — LIDOCAINE 5 % EX PTCH
1.0000 | MEDICATED_PATCH | CUTANEOUS | Status: DC
  Administered 2021-05-31: 1 via TRANSDERMAL
  Filled 2021-05-31: qty 1

## 2021-05-31 NOTE — ED Triage Notes (Signed)
Pain in right knee, right femur and right hip onset 1 WEEK AGO, HISTORY OF BROKEN  RIGHT FEMUR.

## 2021-05-31 NOTE — Discharge Instructions (Addendum)
You were seen in the emergency department for worsening right hip and leg pain.  You had x-rays done that did not show any fracture or dislocation.  Please continue your regular pain medicine.  Lidoderm patch may also be helpful.  Contact your primary care doctor and orthopedic specialist for follow-up.  Return to the emergency department for any worsening or concerning symptoms

## 2021-05-31 NOTE — ED Provider Notes (Signed)
[facility] EMERGENCY DEPARTMENT Provider Note   CSN: [id number] Arrival date & time: 05/31/21  1159     History Chief Complaint  Patient presents with   Hip Pain    Paula Steele is a 75 y.o. female.  She is here with a complaint of right hip and thigh pain its been going on for the last 7 days.  She had a significant fracture of her femur back in February and has been on pain medicine and doing physical therapy since then.  She started some new therapy just over a week ago.  No known trauma.  No chest pain or shortness of breath.  No numbness or weakness.  She says it hurts to put any kind of pressure on the leg.  Pain medicine not helping.  The history is provided by the patient.  Hip Pain This is a new problem. Episode onset: 7 days. The problem occurs constantly. The problem has not changed since onset.Pertinent negatives include no chest pain, no abdominal pain, no headaches and no shortness of breath. The symptoms are aggravated by walking and standing. Nothing relieves the symptoms. She has tried rest for the symptoms. The treatment provided no relief.      Past Medical History:  Diagnosis Date   Arthritis     SHOULDERS--   IS CURRENTLY HAS LEFT SHOULDER PAIN   Benign neoplasm of adrenal gland    Benign neoplasm of colon    Blood transfusion    Bronchitis    10/2012- followed by ER and then PCP- Dr Melina Copa    Chronic kidney disease    bladder incontinence    Chronic meniscal tear of knee, right    Colon polyp    Degeneration of intervertebral disc, site unspecified    LUMBAR   Diabetes mellitus without mention of complication    INSULIN AND ORAL MEDS   Diverticulosis of colon (without mention of hemorrhage)    Esophageal reflux    CONTROLLED W/ PRILOSEC   Femur fracture, right ([place]) 12/10/2020   closed intial encouter   History of neck pain    chronic left sided neck pain   Hx: UTI (urinary tract infection)    Hyperlipidemia    Hypertension    Insomnia DUE TO  STRESS   Left arm pain    and  heaviness since radical neck surg   Neck pain, chronic S/P RADIAL NECK DISSECTION--  2007   LEFT SIDE OF NECK AND SHOULDER PAIN   Oropharyngeal dysphagia MILD RIGHT SUPRAGLOTTIC REGION   PT STATES SHE EATS VERY SLOWLY AND CHEWS FOOD WELL   Other chronic nonalcoholic liver disease    SEES DR Deatra Ina-- ?SCARRING OR CIRRHOSIS PER PET SCAN 2010   Personal history of other diseases of digestive system    OCCASIONAL ABD. 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  (NO RECURRENCE PER PET SCAN  07-03-09)   S/P EXCISION  AND RADICAL NECK DISSECTION 2007   Urgency incontinence    Vitamin D deficiency    Weakness of both legs     Patient Active Problem List   Diagnosis Date Noted   Closed fracture of right distal femur ([place]) 12/11/2020   Hypertension    Shortness of breath 12/30/2013   Hyperlipidemia 12/30/2013   Acute encephalopathy 12/07/2012   CVA (cerebral infarction)  12/07/2012   OA (osteoarthritis) of knee 11/30/2012   Acute medial meniscal tear 03/25/2012   Personal history of colonic polyps 08/15/2011   Abdominal pain, left lower quadrant 08/15/2011   Cirrhosis of liver without mention of alcohol 08/15/2011   Obesity 08/15/2011   NEOPLASM, MALIGNANT, ORAL CAVITY 04/05/2008   DM2 (diabetes mellitus, type 2) ([place]) 04/05/2008   DEPRESSION 04/05/2008   CAD 04/05/2008   GERD 04/05/2008   GASTROPARESIS 04/05/2008   NASH (nonalcoholic steatohepatitis) 04/05/2008   DEGENERATIVE [place] DISEASE 04/05/2008   BENIGN NEOPLASM OF ADRENAL GLAND 03/31/2007   DIVERTICULOSIS OF COLON 09/06/2004   COLONIC POLYPS 02/07/2003    Past Surgical History:  Procedure Laterality Date   ABDOMINAL HYSTERECTOMY  1984   W/  LSO   CATARACT EXTRACTION W/ INTRAOCULAR LENS  IMPLANT, BILATERAL     CHOLECYSTECTOMY  15 YRS AGO   DIRECT  LARYNGOSCOPY  2007   W/ LEFT NECK MASS EXCISION   EXCISION LEFT SEBACOUS CYST  05-14-2011 W/  LOCAL   KNEE ARTHROSCOPY  09/26/2011   Procedure: ARTHROSCOPY KNEE;  Surgeon: Dione Plover Aluisio;  Location: [place];  Service: Orthopedics;  Laterality: Right;  right knee arthroscopy with meniscal tear   KNEE ARTHROSCOPY  03/25/2012   Procedure: ARTHROSCOPY KNEE;  Surgeon: Gearlean Alf, MD;  Location: [facility];  Service: Orthopedics;  Laterality: Right;  With Debridement of medial lateral meniscus   LAPAROSCOPIC SALPINGOOPHERECTOMY  2001   RIGHT  W/ LYSIS AHESIONS   NECK MASS EXCISION  2007   LEFT   OPEN REDUCTION INTERNAL FIXATION (ORIF) DISTAL FEMUR FRACTURE (Right )  12/13/2020   ORIF FEMUR FRACTURE Right 12/13/2020   Procedure: OPEN REDUCTION INTERNAL FIXATION (ORIF) DISTAL FEMUR FRACTURE;  Surgeon: Shona Needles, MD;  Location: [place];  Service: Orthopedics;  Laterality: Right;   RADICAL NECK DISSECTION  2007   W/ LEFT TONSILLECTOMY AND LEFT EXCISION LEFT TONGUE BASE DUE TO CANCER   SHOULDER SURGERY  1990   LEFT   TONSILLECTOMY  01-31-2009   RIGHT (DUE TO MASS)   TONSILLECTOMY  2007   LEFT W/ NECK DISSECTION DUE TO CANCER   TOTAL KNEE ARTHROPLASTY Right 11/30/2012   Procedure: TOTAL KNEE ARTHROPLASTY;  Surgeon: Gearlean Alf, MD;  Location: [facility];  Service: Orthopedics;  Laterality: Right;     OB History   No obstetric history on file.     Family History  Problem Relation Age of Onset   Thyroid cancer Brother    Diabetes Mother    Diabetes Brother    Heart disease Father    Heart disease Brother     Social History   Tobacco Use   Smoking status: Former    Years: 15.00    Types: Cigarettes    Quit date: 09/22/1984    Years since quitting: 36.7   Smokeless tobacco: Never  Vaping Use   Vaping Use: Never used  Substance Use Topics   Alcohol use: No   Drug use: No    Home Medications Prior to Admission medications   Medication Sig Start Date End Date  Taking? Authorizing Provider  acetaminophen (TYLENOL) 325 MG tablet Take 1-2 tablets (325-650 mg total) by mouth every 6 (six) hours as needed for mild pain (pain score 1-3 or temp > 100.5). 12/15/20   Barb Merino, MD  albuterol (VENTOLIN HFA) 108 (90 Base) MCG/ACT inhaler Inhale 2 puffs into the lungs every 6 (six) hours as needed for wheezing or shortness of breath.  [provider]  celecoxib (CELEBREX) 100 MG capsule Take 100 mg by mouth at bedtime. 08/17/20   [provider]  diclofenac Sodium (VOLTAREN) 1 % GEL Apply 2-4 g topically daily as needed (pain). 11/01/20   [provider]  docusate sodium (COLACE) 100 MG capsule Take 1 capsule (100 mg total) by mouth 2 (two) times daily. 12/15/20   Barb Merino, MD  enoxaparin (LOVENOX) 40 MG/0.4ML injection Inject 0.4 mLs (40 mg total) into the skin daily. 12/16/20 01/15/21  Delray Alt, PA-C  fluticasone (FLONASE) 50 MCG/ACT nasal spray Place 1 spray into both nostrils daily. 11/13/20   [provider]  fluticasone-salmeterol (ADVAIR HFA) 093-81 MCG/ACT inhaler Inhale 2 puffs into the lungs daily.    [provider]  glucose blood (ACCU-CHEK GUIDE) test strip Use as instructed 02/21/17   Cassandria Anger, MD  insulin aspart (NOVOLOG) 100 UNIT/ML injection Inject 0-9 Units into the skin 3 (three) times daily with meals. 12/15/20   Barb Merino, MD  montelukast (SINGULAIR) 10 MG tablet Take 10 mg by mouth at bedtime.    [provider]  NARCAN 4 MG/0.1ML LIQD nasal spray kit 1 spray once. 09/11/20   [provider]  Omeprazole (PRILOSEC PO) Take 20 mg by mouth daily as needed (acid reflux).    [provider]  oxyCODONE-acetaminophen (PERCOCET) 10-325 MG tablet Take 1 tablet by mouth every 6 (six) hours as needed for pain.    [provider]  OZEMPIC, 1 MG/DOSE, 4 MG/3ML SOPN Inject 1 mg into the skin every Sunday. 08/03/20   [provider]  pregabalin  (LYRICA) 50 MG capsule Take 50 mg by mouth 2 (two) times daily.    [provider]  promethazine (PHENERGAN) 25 MG tablet Take 25 mg by mouth every 6 (six) hours as needed for nausea or vomiting.    [provider]  tiZANidine (ZANAFLEX) 4 MG tablet Take 1 tablet (4 mg total) by mouth every 6 (six) hours as needed for muscle spasms. 12/15/20   Delray Alt, PA-C  TRESIBA FLEXTOUCH 100 UNIT/ML SOPN FlexTouch Pen Inject 42 Units into the skin daily. 04/28/19   [provider]  vitamin C (ASCORBIC ACID) 500 MG tablet Take 500 mg by mouth daily.    [provider]  Vitamin D, Cholecalciferol, 25 MCG (1000 UT) CAPS Take 1,000 units of lipase by mouth in the morning and at bedtime.    [provider]  vortioxetine HBr (TRINTELLIX) 10 MG TABS tablet Take 10 mg by mouth daily. 08/01/20   [provider]    Allergies    Actos [pioglitazone], Bydureon [exenatide], and Metformin and related  Review of Systems   Review of Systems  Constitutional:  Negative for fever.  Respiratory:  Negative for shortness of breath.   Cardiovascular:  Negative for chest pain.  Gastrointestinal:  Negative for abdominal pain.  Musculoskeletal:  Positive for gait problem.  Skin:  Negative for rash.  Neurological:  Negative for weakness, numbness and headaches.   Physical Exam Updated Vital Signs BP 138/79 (BP Location: Right Arm)   Pulse 93   Temp 98 F (36.7 C) (Oral)   Resp 20   SpO2 100%   Physical Exam Constitutional:      Appearance: Normal appearance. She is well-developed.  HENT:     Head: Normocephalic and atraumatic.  Eyes:     Conjunctiva/sclera: Conjunctivae normal.  Musculoskeletal:        General: Tenderness present. Normal range  of motion.     Cervical back: Neck supple.     Comments: She has some diffuse tenderness on her right lateral hip into her right thigh.  Distal pulses intact.  Knee and ankle nontender.  No significant edema.  No  overlying erythema.  Surgical incision is well-healed.  Skin:    General: Skin is warm and dry.  Neurological:     General: No focal deficit present.     Mental Status: She is alert.     GCS: GCS eye subscore is 4. GCS verbal subscore is 5. GCS motor subscore is 6.    ED Results / Procedures / Treatments   Labs (all labs ordered are listed, but only abnormal results are displayed) Labs Reviewed - No data to display  EKG None  Radiology DG Pelvis 1-2 Views  Result Date: 05/31/2021 CLINICAL DATA:  Right femur pain EXAM: PELVIS - 1-2 VIEW; RIGHT FEMUR 2 VIEWS COMPARISON:  X-ray right femur dated December 13, 2020 FINDINGS: Pelvis: No acute fracture or dislocation. Moderate degenerative changes of the bilateral hips. Mild degenerative changes of the bilateral SI joints. Moderate degenerative changes of the pubic symphysis. Visualized sacrum is intact. Degenerative disc disease of the partially visualized lower lumbar spine. Soft tissues are unremarkable. Vascular calcifications and phleboliths noted in the pelvis. Right femur: No acute fracture or dislocation. Moderate degenerative changes of the right hip joint. Prior total right knee replacement and plate and screw fixation of the distal femur. Hardware is intact. No periprosthetic fracture or lucency. Vascular calcifications. IMPRESSION: No acute osseous abnormality. Moderate degenerative changes of the right hip joint. Prior total right knee replacement and plate and screw fixation of the distal femur. No evidence of hardware complication. Electronically Signed   By: Yetta Glassman MD   On: 05/31/2021 16:20   DG FEMUR, MIN 2 VIEWS RIGHT  Result Date: 05/31/2021 CLINICAL DATA:  Right femur pain EXAM: PELVIS - 1-2 VIEW; RIGHT FEMUR 2 VIEWS COMPARISON:  X-ray right femur dated December 13, 2020 FINDINGS: Pelvis: No acute fracture or dislocation. Moderate degenerative changes of the bilateral hips. Mild degenerative changes of the bilateral SI  joints. Moderate degenerative changes of the pubic symphysis. Visualized sacrum is intact. Degenerative disc disease of the partially visualized lower lumbar spine. Soft tissues are unremarkable. Vascular calcifications and phleboliths noted in the pelvis. Right femur: No acute fracture or dislocation. Moderate degenerative changes of the right hip joint. Prior total right knee replacement and plate and screw fixation of the distal femur. Hardware is intact. No periprosthetic fracture or lucency. Vascular calcifications. IMPRESSION: No acute osseous abnormality. Moderate degenerative changes of the right hip joint. Prior total right knee replacement and plate and screw fixation of the distal femur. No evidence of hardware complication. Electronically Signed   By: Yetta Glassman MD   On: 05/31/2021 16:20    Procedures Procedures   Medications Ordered in ED Medications - No data to display  ED Course  I have reviewed the triage vital signs and the nursing notes.  Pertinent labs & imaging results that were available during my care of the patient were reviewed by me and considered in my medical decision making (see chart for details).  Clinical Course as of 06/01/21 0948  Thu May 31, 2021  1625 X-rays ordered and interpreted by me as hardware intact no acute fracture.  Awaiting radiology reading. [MB]    Clinical Course User Index [MB] Hayden Rasmussen, MD   MDM Rules/Calculators/A&P  Differential diagnosis includes fracture, dislocation, hardware failure, musculoskeletal pain, wound infection.  X-rays do not show any obvious fracture or hardware failure.  Wound is clean dry intact.  Patient already on pain medication.  She is agreeable to trial of some Lidoderm patches as she has had some success with her back pain with this before.  Recommended follow-up with orthopedics.  Return instructions discussed Final Clinical Impression(s) / ED Diagnoses Final diagnoses:   Right hip pain    Rx / DC Orders ED Discharge Orders          Ordered    lidocaine (LIDODERM) 5 %  Every 24 hours        05/31/21 1632             Hayden Rasmussen, MD 06/01/21 [phone]

## 2023-06-01 NOTE — Progress Notes (Unsigned)
New Patient Note  RE: Paula Steele MRN: [id number] DOB: 09/16/1946 Date of Office Visit: 06/02/2023  Consult requested by: Roby Lofts, MD Primary care provider: Rebekah Chesterfield, NP  Chief Complaint: No chief complaint on file.  History of Present Illness: I had the pleasure of seeing Paula Steele for initial evaluation at the [facility] on 06/01/2023. She is a 77 y.o. female, who is referred here by Rebekah Chesterfield, NP for the evaluation of nickel allergy.  Patient is scheduled for *** replacement on ***. Patient had issues with ***.  No issues with watches, rings, bracelets, necklaces, belts, buttons on pants.   Prior joint replacements or metals in the body: ***.  Assessment and Plan: Paula Steele is a 77 y.o. female with: There are no diagnoses linked to this encounter.   No follow-ups on file.  No orders of the defined types were placed in this encounter.  Lab Orders  No laboratory test(s) ordered today    Other allergy screening: Asthma: {Blank single:19197::"yes","no"} Rhino conjunctivitis: {Blank single:19197::"yes","no"} Food allergy: {Blank single:19197::"yes","no"} Medication allergy: {Blank single:19197::"yes","no"} Hymenoptera allergy: {Blank single:19197::"yes","no"} Urticaria: {Blank single:19197::"yes","no"} Eczema:{Blank single:19197::"yes","no"} History of recurrent infections suggestive of immunodeficency: {Blank single:19197::"yes","no"}  Diagnostics: Spirometry:  Tracings reviewed. Her effort: {Blank single:19197::"Good reproducible efforts.","It was hard to get consistent efforts and there is a question as to whether this reflects a maximal maneuver.","Poor effort, data can not be interpreted."} FVC: ***L FEV1: ***L, ***% predicted FEV1/FVC ratio: ***% Interpretation: {Blank single:19197::"Spirometry consistent with mild obstructive disease","Spirometry consistent with moderate obstructive disease","Spirometry  consistent with severe obstructive disease","Spirometry consistent with possible restrictive disease","Spirometry consistent with mixed obstructive and restrictive disease","Spirometry uninterpretable due to technique","Spirometry consistent with normal pattern","No overt abnormalities noted given today's efforts"}.  Please see scanned spirometry results for details.  Skin Testing: {Blank single:19197::"Select foods","Environmental allergy panel","Environmental allergy panel and select foods","Food allergy panel","None","Deferred due to recent antihistamines use"}. *** Results discussed with patient/family.   Past Medical History: Patient Active Problem List   Diagnosis Date Noted  . Closed fracture of right distal femur (HCC) 12/11/2020  . Hypertension   . Shortness of breath 12/30/2013  . Hyperlipidemia 12/30/2013  . Acute encephalopathy 12/07/2012  . CVA (cerebral infarction) 12/07/2012  . OA (osteoarthritis) of knee 11/30/2012  . Acute medial meniscal tear 03/25/2012  . Personal history of colonic polyps 08/15/2011  . Abdominal pain, left lower quadrant 08/15/2011  . Cirrhosis of liver without mention of alcohol 08/15/2011  . Obesity 08/15/2011  . 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 DM2 (diabetes mellitus, type 2) (HCC) 04/05/2008  . DEPRESSION 04/05/2008  . CAD 04/05/2008  . GERD 04/05/2008  . GASTROPARESIS 04/05/2008  . NASH (nonalcoholic steatohepatitis) 04/05/2008  . DEGENERATIVE DISC DISEASE 04/05/2008  . BENIGN NEOPLASM OF ADRENAL GLAND 03/31/2007  . DIVERTICULOSIS OF COLON 09/06/2004  . COLONIC POLYPS 02/07/2003   Past Medical History:  Diagnosis Date  . Arthritis     SHOULDERS--   IS CURRENTLY HAS LEFT SHOULDER PAIN  . Benign neoplasm of adrenal gland   . Benign neoplasm of colon   . Blood transfusion   . Bronchitis    10/2012- followed by ER and then PCP- Dr Charm Barges   . Chronic kidney disease    bladder incontinence   . Chronic meniscal tear of  knee, right   . Colon polyp   . Degeneration of intervertebral disc, site unspecified    LUMBAR  . Diabetes mellitus without mention of complication    INSULIN  AND ORAL MEDS  . Diverticulosis of colon (without mention of hemorrhage)   . Esophageal reflux    CONTROLLED W/ PRILOSEC  . Femur fracture, right (HCC) 12/10/2020   closed intial encouter  . History of neck pain    chronic left sided neck pain  . Hx: UTI (urinary tract infection)   . Hyperlipidemia   . Hypertension   . Insomnia DUE TO STRESS  . Left arm pain    and  heaviness since radical neck surg  . Neck pain, chronic S/P RADIAL NECK DISSECTION--  2007   LEFT SIDE OF NECK AND SHOULDER PAIN  . Oropharyngeal dysphagia MILD RIGHT SUPRAGLOTTIC REGION   PT STATES SHE EATS VERY SLOWLY AND CHEWS FOOD WELL  . Other chronic nonalcoholic liver disease    SEES DR Arlyce Dice-- ?SCARRING OR CIRRHOSIS PER PET SCAN 2010  . Personal history of other diseases of digestive system    OCCASIONAL ABD. PAIN  . 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 Shortness of breath   . Shortness of breath on exertion   . 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 Urgency incontinence   . Vitamin D deficiency   . Weakness of both legs    Past Surgical History: Past Surgical History:  Procedure Laterality Date  . ABDOMINAL HYSTERECTOMY  1984   W/  LSO  . CATARACT EXTRACTION W/ INTRAOCULAR LENS  IMPLANT, BILATERAL    . CHOLECYSTECTOMY  15 YRS AGO  . DIRECT LARYNGOSCOPY  2007   W/ LEFT NECK MASS EXCISION  . EXCISION LEFT SEBACOUS CYST  05-14-2011 W/  LOCAL  . KNEE ARTHROSCOPY  09/26/2011   Procedure: ARTHROSCOPY KNEE;  Surgeon: Loanne Drilling;  Location: [facility];  Service: Orthopedics;  Laterality: Right;  right knee arthroscopy with meniscal tear  . KNEE ARTHROSCOPY  03/25/2012   Procedure: ARTHROSCOPY KNEE;   Surgeon: Loanne Drilling, MD;  Location: [facility];  Service: Orthopedics;  Laterality: Right;  With Debridement of medial lateral meniscus  . LAPAROSCOPIC SALPINGOOPHERECTOMY  2001   RIGHT  W/ LYSIS AHESIONS  . NECK MASS EXCISION  2007   LEFT  . OPEN REDUCTION INTERNAL FIXATION (ORIF) DISTAL FEMUR FRACTURE (Right )  12/13/2020  . ORIF FEMUR FRACTURE Right 12/13/2020   Procedure: OPEN REDUCTION INTERNAL FIXATION (ORIF) DISTAL FEMUR FRACTURE;  Surgeon: Roby Lofts, MD;  Location: [facility];  Service: Orthopedics;  Laterality: Right;  . RADICAL NECK DISSECTION  2007   W/ LEFT TONSILLECTOMY AND LEFT EXCISION LEFT TONGUE BASE DUE TO CANCER  . SHOULDER SURGERY  1990   LEFT  . TONSILLECTOMY  01-31-2009   RIGHT (DUE TO MASS)  . TONSILLECTOMY  2007   LEFT W/ NECK DISSECTION DUE TO CANCER  . TOTAL KNEE ARTHROPLASTY Right 11/30/2012   Procedure: TOTAL KNEE ARTHROPLASTY;  Surgeon: Loanne Drilling, MD;  Location: [facility];  Service: Orthopedics;  Laterality: Right;   Medication List:  Current Outpatient Medications  Medication Sig Dispense Refill  . acetaminophen (TYLENOL) 325 MG tablet Take 1-2 tablets (325-650 mg total) by mouth every 6 (six) hours as needed for mild pain (pain score 1-3 or temp > 100.5).    Marland Kitchen albuterol (VENTOLIN HFA) 108 (90 Base) MCG/ACT inhaler Inhale 2 puffs into the lungs every 6 (six) hours as needed for wheezing or shortness of breath.    Marland Kitchen  celecoxib (CELEBREX) 100 MG capsule Take 100 mg by mouth at bedtime.    . diclofenac Sodium (VOLTAREN) 1 % GEL Apply 2-4 g topically daily as needed (pain).    Marland Kitchen docusate sodium (COLACE) 100 MG capsule Take 1 capsule (100 mg total) by mouth 2 (two) times daily. 10 capsule 0  . enoxaparin (LOVENOX) 40 MG/0.4ML injection Inject 0.4 mLs (40 mg total) into the skin daily. 12 mL 0  . fluticasone (FLONASE) 50 MCG/ACT nasal spray Place 1 spray into both nostrils daily.    . fluticasone-salmeterol (ADVAIR HFA) 115-21 MCG/ACT inhaler Inhale 2 puffs into  the lungs daily.    Marland Kitchen glucose blood (ACCU-CHEK GUIDE) test strip Use as instructed 150 each 2  . insulin aspart (NOVOLOG) 100 UNIT/ML injection Inject 0-9 Units into the skin 3 (three) times daily with meals. 10 mL 11  . lidocaine (LIDODERM) 5 % Place 1 patch onto the skin daily. Remove & Discard patch within 12 hours or as directed by MD 30 patch 0  . montelukast (SINGULAIR) 10 MG tablet Take 10 mg by mouth at bedtime.    Marland Kitchen NARCAN 4 MG/0.1ML LIQD nasal spray kit 1 spray once.    . Omeprazole (PRILOSEC PO) Take 20 mg by mouth daily as needed (acid reflux).    Marland Kitchen oxyCODONE-acetaminophen (PERCOCET) 10-325 MG tablet Take 1 tablet by mouth every 6 (six) hours as needed for pain.    Marland Kitchen OZEMPIC, 1 MG/DOSE, 4 MG/3ML SOPN Inject 1 mg into the skin every Sunday.    . pregabalin (LYRICA) 50 MG capsule Take 50 mg by mouth 2 (two) times daily.    . promethazine (PHENERGAN) 25 MG tablet Take 25 mg by mouth every 6 (six) hours as needed for nausea or vomiting.    Marland Kitchen tiZANidine (ZANAFLEX) 4 MG tablet Take 1 tablet (4 mg total) by mouth every 6 (six) hours as needed for muscle spasms. 28 tablet 0  . TRESIBA FLEXTOUCH 100 UNIT/ML SOPN FlexTouch Pen Inject 42 Units into the skin daily.    . vitamin C (ASCORBIC ACID) 500 MG tablet Take 500 mg by mouth daily.    . Vitamin D, Cholecalciferol, 25 MCG (1000 UT) CAPS Take 1,000 units of lipase by mouth in the morning and at bedtime.    . vortioxetine HBr (TRINTELLIX) 10 MG TABS tablet Take 10 mg by mouth daily.     No current facility-administered medications for this visit.   Allergies: Allergies  Allergen Reactions  . Actos [Pioglitazone]     edema  . Bydureon [Exenatide]     nausea  . Metformin And Related     diahrrhea   Social History: Social History   Socioeconomic History  . Marital status: Married    Spouse name: Not on file  . Number of children: 4  . Years of education: Not on file  . Highest education level: Not on file  Occupational History   . Occupation: Disabled  Tobacco Use  . Smoking status: Former    Current packs/day: 0.00    Types: Cigarettes    Start date: 09/22/1969    Quit date: 09/22/1984    Years since quitting: 38.7  . Smokeless tobacco: Never  Vaping Use  . Vaping status: Never Used  Substance and Sexual Activity  . Alcohol use: No  . Drug use: No  . Sexual activity: Not Currently  Other Topics Concern  . Not on file  Social History Narrative  . Not on file   Social Determinants  of Health   Financial Resource Strain: Not on file  Food Insecurity: Not on file  Transportation Needs: Not on file  Physical Activity: Not on file  Stress: Not on file  Social Connections: Not on file   Lives in a ***. Smoking: *** Occupation: ***  Environmental HistorySurveyor, minerals in the house: Copywriter, advertising in the family room: {Blank single:19197::"yes","no"} Carpet in the bedroom: {Blank single:19197::"yes","no"} Heating: {Blank single:19197::"electric","gas","heat pump"} Cooling: {Blank single:19197::"central","window","heat pump"} Pet: {Blank single:19197::"yes ***","no"}  Family History: Family History  Problem Relation Age of Onset  . Thyroid cancer Brother   . Diabetes Mother   . Diabetes Brother   . Heart disease Father   . Heart disease Brother    Problem                               Relation Asthma                                   *** Eczema                                *** Food allergy                          *** Allergic rhino conjunctivitis     ***  Review of Systems  Constitutional:  Negative for appetite change, chills, fever and unexpected weight change.  HENT:  Negative for congestion and rhinorrhea.   Eyes:  Negative for itching.  Respiratory:  Negative for cough, chest tightness, shortness of breath and wheezing.   Cardiovascular:  Negative for chest pain.  Gastrointestinal:  Negative for abdominal pain.  Genitourinary:  Negative for  difficulty urinating.  Skin:  Negative for rash.  Neurological:  Negative for headaches.   Objective: There were no vitals taken for this visit. There is no height or weight on file to calculate BMI. Physical Exam Vitals and nursing note reviewed.  Constitutional:      Appearance: Normal appearance. She is well-developed.  HENT:     Head: Normocephalic and atraumatic.     Right Ear: Tympanic membrane and external ear normal.     Left Ear: Tympanic membrane and external ear normal.     Nose: Nose normal.     Mouth/Throat:     Mouth: Mucous membranes are moist.     Pharynx: Oropharynx is clear.  Eyes:     Conjunctiva/sclera: Conjunctivae normal.  Cardiovascular:     Rate and Rhythm: Normal rate and regular rhythm.     Heart sounds: Normal heart sounds. No murmur heard.    No friction rub. No gallop.  Pulmonary:     Effort: Pulmonary effort is normal.     Breath sounds: Normal breath sounds. No wheezing, rhonchi or rales.  Musculoskeletal:     Cervical back: Neck supple.  Skin:    General: Skin is warm.     Findings: No rash.  Neurological:     Mental Status: She is alert and oriented to person, place, and time.  Psychiatric:        Behavior: Behavior normal.  The plan was reviewed with the patient/family, and all questions/concerned were addressed.  It was my pleasure to see Paula Steele today and participate in her care. Please feel  free to contact me with any questions or concerns.  Sincerely,  Wyline Mood, DO Allergy & Immunology  [facility] office: [phone] [facility] office: [phone]

## 2023-06-02 ENCOUNTER — Encounter: Payer: Self-pay | Admitting: Allergy

## 2023-06-02 ENCOUNTER — Other Ambulatory Visit: Payer: Self-pay

## 2023-06-02 ENCOUNTER — Ambulatory Visit (INDEPENDENT_AMBULATORY_CARE_PROVIDER_SITE_OTHER): Payer: Medicare Other | Admitting: Allergy

## 2023-06-02 VITALS — BP 116/70 | HR 104 | Temp 97.9°F | Resp 16 | Ht 67.0 in | Wt 195.3 lb

## 2023-06-02 DIAGNOSIS — G8929 Other chronic pain: Secondary | ICD-10-CM

## 2023-06-02 DIAGNOSIS — R42 Dizziness and giddiness: Secondary | ICD-10-CM | POA: Diagnosis not present

## 2023-06-02 DIAGNOSIS — L2389 Allergic contact dermatitis due to other agents: Secondary | ICD-10-CM | POA: Diagnosis not present

## 2023-06-02 DIAGNOSIS — M25561 Pain in right knee: Secondary | ICD-10-CM | POA: Diagnosis not present

## 2023-06-02 NOTE — Patient Instructions (Addendum)
Nickel allergy Discussed with patient that patch testing tests for contact dermatitis and sometimes it does not correlate to how one will react to metals in the body. Positive patch testing results can help in avoiding those items however it is possible to get false negative results.  Nevertheless, this is the most accessible test for metal sensitivity currently available.  Based on your history - if you had severe nickel allergy, your knee would not have healed like it did.  I will send my note to Dr. Jena Gauss. Please discuss with him regarding next steps.  If they want you to do metal patch testing then let us know.   Follow up with your PCP regarding your dizziness.   Return if symptoms worsen or fail to improve. Or sooner if needed.

## 2024-07-05 ENCOUNTER — Encounter (HOSPITAL_COMMUNITY): Payer: Self-pay | Admitting: Emergency Medicine

## 2024-07-05 ENCOUNTER — Other Ambulatory Visit: Payer: Self-pay

## 2024-07-05 ENCOUNTER — Emergency Department (HOSPITAL_COMMUNITY)
Admission: EM | Admit: 2024-07-05 | Discharge: 2024-07-05 | Disposition: A | Discharge status: Home / Self Care | Attending: Emergency Medicine | Admitting: Emergency Medicine

## 2024-07-05 DIAGNOSIS — G8929 Other chronic pain: Secondary | ICD-10-CM | POA: Diagnosis not present

## 2024-07-05 DIAGNOSIS — E119 Type 2 diabetes mellitus without complications: Secondary | ICD-10-CM | POA: Insufficient documentation

## 2024-07-05 DIAGNOSIS — Z85828 Personal history of other malignant neoplasm of skin: Secondary | ICD-10-CM | POA: Diagnosis not present

## 2024-07-05 DIAGNOSIS — I129 Hypertensive chronic kidney disease with stage 1 through stage 4 chronic kidney disease, or unspecified chronic kidney disease: Secondary | ICD-10-CM | POA: Diagnosis not present

## 2024-07-05 DIAGNOSIS — N189 Chronic kidney disease, unspecified: Secondary | ICD-10-CM | POA: Diagnosis not present

## 2024-07-05 DIAGNOSIS — F1193 Opioid use, unspecified with withdrawal: Secondary | ICD-10-CM | POA: Insufficient documentation

## 2024-07-05 MED ORDER — PREGABALIN 75 MG PO CAPS
75.0000 mg | ORAL_CAPSULE | Freq: Once | ORAL | Status: AC
  Administered 2024-07-05: 75 mg via ORAL
  Filled 2024-07-05: qty 1

## 2024-07-05 MED ORDER — LOPERAMIDE HCL 2 MG PO CAPS: 2.0000 mg | ORAL_CAPSULE | Freq: Four times a day (QID) | ORAL | 0 refills | Status: AC | PRN

## 2024-07-05 MED ORDER — ONDANSETRON HCL 4 MG PO TABS: 4.0000 mg | ORAL_TABLET | ORAL | 0 refills | Status: AC | PRN

## 2024-07-05 MED ORDER — PREGABALIN 50 MG PO CAPS: 50.0000 mg | ORAL_CAPSULE | Freq: Two times a day (BID) | ORAL | 0 refills | Status: AC
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
## 2024-07-05 NOTE — Discharge Instructions (Addendum)
 It was a pleasure caring for you today in the emergency department.  Please call your PCP and pain management specialist to arrange follow up and for further care  Please return to the emergency department for any worsening or worrisome symptoms.
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
## 2024-07-06 ENCOUNTER — Emergency Department (HOSPITAL_COMMUNITY)
Admission: EM | Admit: 2024-07-06 | Discharge: 2024-07-06 | Disposition: A | Discharge status: Home / Self Care | Attending: Emergency Medicine | Admitting: Emergency Medicine

## 2024-07-06 ENCOUNTER — Other Ambulatory Visit: Payer: Self-pay

## 2024-07-06 ENCOUNTER — Emergency Department (HOSPITAL_COMMUNITY)

## 2024-07-06 ENCOUNTER — Encounter (HOSPITAL_COMMUNITY): Payer: Self-pay | Admitting: Emergency Medicine

## 2024-07-06 DIAGNOSIS — Z7984 Long term (current) use of oral hypoglycemic drugs: Secondary | ICD-10-CM | POA: Diagnosis not present

## 2024-07-06 DIAGNOSIS — N189 Chronic kidney disease, unspecified: Secondary | ICD-10-CM | POA: Insufficient documentation

## 2024-07-06 DIAGNOSIS — Z794 Long term (current) use of insulin: Secondary | ICD-10-CM | POA: Insufficient documentation

## 2024-07-06 DIAGNOSIS — Z7982 Long term (current) use of aspirin: Secondary | ICD-10-CM | POA: Insufficient documentation

## 2024-07-06 DIAGNOSIS — E860 Dehydration: Secondary | ICD-10-CM | POA: Diagnosis not present

## 2024-07-06 DIAGNOSIS — E1122 Type 2 diabetes mellitus with diabetic chronic kidney disease: Secondary | ICD-10-CM | POA: Insufficient documentation

## 2024-07-06 DIAGNOSIS — R112 Nausea with vomiting, unspecified: Secondary | ICD-10-CM | POA: Diagnosis present

## 2024-07-06 DIAGNOSIS — R55 Syncope and collapse: Secondary | ICD-10-CM | POA: Diagnosis not present

## 2024-07-06 DIAGNOSIS — Z85828 Personal history of other malignant neoplasm of skin: Secondary | ICD-10-CM | POA: Insufficient documentation

## 2024-07-06 LAB — COMPREHENSIVE METABOLIC PANEL WITH GFR
ALT: 17 U/L (ref 0–44)
AST: 21 U/L (ref 15–41)
Albumin: 3.4 g/dL — ABNORMAL LOW (ref 3.5–5.0)
Alkaline Phosphatase: 92 U/L (ref 38–126)
Anion gap: 10 (ref 5–15)
BUN: 17 mg/dL (ref 8–23)
CO2: 25 mmol/L (ref 22–32)
Calcium: 9.1 mg/dL (ref 8.9–10.3)
Chloride: 103 mmol/L (ref 98–111)
Creatinine, Ser: 1.03 mg/dL — ABNORMAL HIGH (ref 0.44–1.00)
GFR, Estimated: 55 mL/min — ABNORMAL LOW (ref >60)
Glucose, Bld: 247 mg/dL — ABNORMAL HIGH (ref 70–99)
Potassium: 4.4 mmol/L (ref 3.5–5.1)
Sodium: 138 mmol/L (ref 135–145)
Total Bilirubin: 1 mg/dL (ref 0.0–1.2)
Total Protein: 7.4 g/dL (ref 6.5–8.1)

## 2024-07-06 LAB — CBC WITH DIFFERENTIAL/PLATELET
Abs Immature Granulocytes: 0.04 K/uL (ref 0.00–0.07)
Basophils Absolute: 0.1 K/uL (ref 0.0–0.1)
Basophils Relative: 1 %
Eosinophils Absolute: 0.1 K/uL (ref 0.0–0.5)
Eosinophils Relative: 1 %
HCT: 37.8 % (ref 36.0–46.0)
Hemoglobin: 12.5 g/dL (ref 12.0–15.0)
Immature Granulocytes: 1 %
Lymphocytes Relative: 16 %
Lymphs Abs: 1.2 K/uL (ref 0.7–4.0)
MCH: 28.5 pg (ref 26.0–34.0)
MCHC: 33.1 g/dL (ref 30.0–36.0)
MCV: 86.1 fL (ref 80.0–100.0)
Monocytes Absolute: 0.3 K/uL (ref 0.1–1.0)
Monocytes Relative: 4 %
Neutro Abs: 5.7 K/uL (ref 1.7–7.7)
Neutrophils Relative %: 77 %
Platelets: 171 K/uL (ref 150–400)
RBC: 4.39 MIL/uL (ref 3.87–5.11)
RDW: 14.2 % (ref 11.5–15.5)
WBC: 7.3 K/uL (ref 4.0–10.5)
nRBC: 0 % (ref 0.0–0.2)

## 2024-07-06 LAB — CBG MONITORING, ED: Glucose-Capillary: 251 mg/dL — ABNORMAL HIGH (ref 70–99)

## 2024-07-06 LAB — URINALYSIS, ROUTINE W REFLEX MICROSCOPIC
Bacteria, UA: NONE SEEN
Bilirubin Urine: NEGATIVE
Glucose, UA: 50 mg/dL — AB
Hgb urine dipstick: NEGATIVE
Ketones, ur: NEGATIVE mg/dL
Nitrite: NEGATIVE
Protein, ur: NEGATIVE mg/dL
Specific Gravity, Urine: 1.009 (ref 1.005–1.030)
pH: 5 (ref 5.0–8.0)

## 2024-07-06 LAB — TROPONIN I (HIGH SENSITIVITY)
Troponin I (High Sensitivity): 5 ng/L (ref <18)
Troponin I (High Sensitivity): 6 ng/L

## 2024-07-06 LAB — LACTIC ACID, PLASMA: Lactic Acid, Venous: 1.6 mmol/L (ref 0.5–1.9)

## 2024-07-06 MED ORDER — SODIUM CHLORIDE 0.9 % IV BOLUS
1000.0000 mL | Freq: Once | INTRAVENOUS | Status: AC
  Administered 2024-07-06: 1000 mL via INTRAVENOUS

## 2024-07-06 MED ORDER — LACTATED RINGERS IV BOLUS
1000.0000 mL | Freq: Once | INTRAVENOUS | Status: AC
  Administered 2024-07-06: 1000 mL via INTRAVENOUS
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
## 2024-07-06 NOTE — ED Triage Notes (Signed)
 Pt BIB [facility] from [facility] for near syncope  Pt was seen here yesterday for other complaint;  BP 79/53 HR 84 O2 99% on RA Cbg 305

## 2024-07-06 NOTE — ED Provider Notes (Signed)
  Physical Exam  BP (!) 73/53 (BP Location: Right Arm)   Pulse 80   Temp 98 F (36.7 C) (Oral)   Resp 18   SpO2 98%   Physical Exam  Procedures  Procedures  ED Course / MDM    Medical Decision Making Amount and/or Complexity of Data Reviewed Labs: ordered. Radiology: ordered.   Received signout.  Near syncope.  Likely related to dehydration.  Has  been coming off pain medicines which hasTrigger decreased oral intake.  However feeling better after fluids.  Blood pressure improved.  Feeling much better has eaten.  Will discharge home.      Patsey Lot, MD 07/06/24 [phone]

## 2024-07-06 NOTE — ED Triage Notes (Signed)
 Pt arrived via [facility] from [facility] c/o syncopal episode and hypotension

## 2024-09-01 ENCOUNTER — Ambulatory Visit: Admitting: Physical Therapy
